# Patient Record
Sex: Male | Born: 1955 | Race: White | Hispanic: No | Marital: Married | Smoking: Never smoker
Health system: Southern US, Community
[De-identification: ages and names within clinical notes are randomized; demographics above are authoritative.]

## PROBLEM LIST (undated history)

## (undated) DIAGNOSIS — I4891 Unspecified atrial fibrillation: Secondary | ICD-10-CM

## (undated) DIAGNOSIS — F32A Depression, unspecified: Secondary | ICD-10-CM

## (undated) DIAGNOSIS — T7840XA Allergy, unspecified, initial encounter: Secondary | ICD-10-CM

## (undated) DIAGNOSIS — I499 Cardiac arrhythmia, unspecified: Secondary | ICD-10-CM

## (undated) DIAGNOSIS — G4733 Obstructive sleep apnea (adult) (pediatric): Secondary | ICD-10-CM

## (undated) DIAGNOSIS — L509 Urticaria, unspecified: Secondary | ICD-10-CM

## (undated) DIAGNOSIS — C61 Malignant neoplasm of prostate: Secondary | ICD-10-CM

## (undated) DIAGNOSIS — K5792 Diverticulitis of intestine, part unspecified, without perforation or abscess without bleeding: Secondary | ICD-10-CM

## (undated) DIAGNOSIS — F329 Major depressive disorder, single episode, unspecified: Secondary | ICD-10-CM

## (undated) DIAGNOSIS — M1990 Unspecified osteoarthritis, unspecified site: Secondary | ICD-10-CM

## (undated) DIAGNOSIS — M47816 Spondylosis without myelopathy or radiculopathy, lumbar region: Secondary | ICD-10-CM

## (undated) DIAGNOSIS — G4730 Sleep apnea, unspecified: Secondary | ICD-10-CM

## (undated) DIAGNOSIS — D369 Benign neoplasm, unspecified site: Secondary | ICD-10-CM

## (undated) DIAGNOSIS — R194 Change in bowel habit: Secondary | ICD-10-CM

## (undated) DIAGNOSIS — K5900 Constipation, unspecified: Secondary | ICD-10-CM

## (undated) DIAGNOSIS — I10 Essential (primary) hypertension: Secondary | ICD-10-CM

## (undated) HISTORY — DX: Allergy, unspecified, initial encounter: T78.40XA

## (undated) HISTORY — DX: Major depressive disorder, single episode, unspecified: F32.9

## (undated) HISTORY — DX: Unspecified atrial fibrillation: I48.91

## (undated) HISTORY — PX: ROTATOR CUFF REPAIR: SHX139

## (undated) HISTORY — DX: Unspecified osteoarthritis, unspecified site: M19.90

## (undated) HISTORY — PX: VASECTOMY: SHX75

## (undated) HISTORY — PX: KNEE ARTHROSCOPY: SUR90

## (undated) HISTORY — PX: COLONOSCOPY: SHX174

## (undated) HISTORY — PX: EYE SURGERY: SHX253

## (undated) HISTORY — DX: Urticaria, unspecified: L50.9

## (undated) HISTORY — DX: Depression, unspecified: F32.A

---

## 2005-04-07 ENCOUNTER — Ambulatory Visit: Payer: Self-pay | Admitting: Internal Medicine

## 2005-05-24 ENCOUNTER — Ambulatory Visit: Payer: Self-pay | Admitting: Internal Medicine

## 2007-03-08 ENCOUNTER — Emergency Department: Payer: Self-pay | Admitting: Emergency Medicine

## 2007-03-09 ENCOUNTER — Other Ambulatory Visit: Payer: Self-pay

## 2007-10-22 ENCOUNTER — Ambulatory Visit: Payer: Self-pay | Admitting: Urology

## 2008-03-28 ENCOUNTER — Ambulatory Visit: Payer: Self-pay | Admitting: Unknown Physician Specialty

## 2010-02-05 ENCOUNTER — Ambulatory Visit: Payer: Self-pay | Admitting: Internal Medicine

## 2010-02-11 ENCOUNTER — Other Ambulatory Visit: Payer: Self-pay | Admitting: Unknown Physician Specialty

## 2010-02-12 ENCOUNTER — Inpatient Hospital Stay: Payer: Self-pay

## 2010-07-20 ENCOUNTER — Observation Stay: Payer: Self-pay | Admitting: Internal Medicine

## 2013-01-02 DIAGNOSIS — N411 Chronic prostatitis: Secondary | ICD-10-CM | POA: Insufficient documentation

## 2013-01-02 DIAGNOSIS — R361 Hematospermia: Secondary | ICD-10-CM | POA: Insufficient documentation

## 2013-01-02 DIAGNOSIS — N529 Male erectile dysfunction, unspecified: Secondary | ICD-10-CM | POA: Insufficient documentation

## 2014-06-09 DIAGNOSIS — Z8679 Personal history of other diseases of the circulatory system: Secondary | ICD-10-CM | POA: Insufficient documentation

## 2014-06-10 ENCOUNTER — Ambulatory Visit: Payer: Self-pay | Admitting: Internal Medicine

## 2014-09-17 DIAGNOSIS — I10 Essential (primary) hypertension: Secondary | ICD-10-CM | POA: Insufficient documentation

## 2015-12-11 DIAGNOSIS — Z9989 Dependence on other enabling machines and devices: Secondary | ICD-10-CM

## 2015-12-11 DIAGNOSIS — I10 Essential (primary) hypertension: Secondary | ICD-10-CM | POA: Insufficient documentation

## 2015-12-11 DIAGNOSIS — G4733 Obstructive sleep apnea (adult) (pediatric): Secondary | ICD-10-CM | POA: Insufficient documentation

## 2016-06-29 DIAGNOSIS — J0100 Acute maxillary sinusitis, unspecified: Secondary | ICD-10-CM | POA: Diagnosis not present

## 2016-10-12 DIAGNOSIS — J342 Deviated nasal septum: Secondary | ICD-10-CM | POA: Diagnosis not present

## 2016-10-12 DIAGNOSIS — J301 Allergic rhinitis due to pollen: Secondary | ICD-10-CM | POA: Diagnosis not present

## 2016-10-12 DIAGNOSIS — J309 Allergic rhinitis, unspecified: Secondary | ICD-10-CM | POA: Diagnosis not present

## 2016-11-02 DIAGNOSIS — J309 Allergic rhinitis, unspecified: Secondary | ICD-10-CM | POA: Diagnosis not present

## 2016-12-08 DIAGNOSIS — Z0000 Encounter for general adult medical examination without abnormal findings: Secondary | ICD-10-CM | POA: Diagnosis not present

## 2016-12-14 DIAGNOSIS — Z0000 Encounter for general adult medical examination without abnormal findings: Secondary | ICD-10-CM | POA: Diagnosis not present

## 2016-12-19 DIAGNOSIS — D1800 Hemangioma unspecified site: Secondary | ICD-10-CM | POA: Diagnosis not present

## 2016-12-19 DIAGNOSIS — L821 Other seborrheic keratosis: Secondary | ICD-10-CM | POA: Diagnosis not present

## 2017-03-09 DIAGNOSIS — N401 Enlarged prostate with lower urinary tract symptoms: Secondary | ICD-10-CM | POA: Diagnosis not present

## 2017-03-09 DIAGNOSIS — N411 Chronic prostatitis: Secondary | ICD-10-CM | POA: Diagnosis not present

## 2017-03-09 DIAGNOSIS — R972 Elevated prostate specific antigen [PSA]: Secondary | ICD-10-CM | POA: Diagnosis not present

## 2017-03-09 DIAGNOSIS — N138 Other obstructive and reflux uropathy: Secondary | ICD-10-CM | POA: Diagnosis not present

## 2017-03-09 DIAGNOSIS — R3989 Other symptoms and signs involving the genitourinary system: Secondary | ICD-10-CM | POA: Insufficient documentation

## 2017-03-09 DIAGNOSIS — R339 Retention of urine, unspecified: Secondary | ICD-10-CM | POA: Insufficient documentation

## 2017-03-18 DIAGNOSIS — R972 Elevated prostate specific antigen [PSA]: Secondary | ICD-10-CM | POA: Diagnosis not present

## 2017-03-31 DIAGNOSIS — Z23 Encounter for immunization: Secondary | ICD-10-CM | POA: Diagnosis not present

## 2017-04-26 DIAGNOSIS — C61 Malignant neoplasm of prostate: Secondary | ICD-10-CM | POA: Diagnosis not present

## 2017-04-26 DIAGNOSIS — R935 Abnormal findings on diagnostic imaging of other abdominal regions, including retroperitoneum: Secondary | ICD-10-CM | POA: Diagnosis not present

## 2017-04-26 DIAGNOSIS — R972 Elevated prostate specific antigen [PSA]: Secondary | ICD-10-CM | POA: Diagnosis not present

## 2017-04-26 DIAGNOSIS — R978 Other abnormal tumor markers: Secondary | ICD-10-CM | POA: Insufficient documentation

## 2017-05-03 DIAGNOSIS — N320 Bladder-neck obstruction: Secondary | ICD-10-CM | POA: Diagnosis not present

## 2017-05-03 DIAGNOSIS — C61 Malignant neoplasm of prostate: Secondary | ICD-10-CM | POA: Diagnosis not present

## 2017-06-26 DIAGNOSIS — M25552 Pain in left hip: Secondary | ICD-10-CM | POA: Diagnosis not present

## 2017-06-26 DIAGNOSIS — M1612 Unilateral primary osteoarthritis, left hip: Secondary | ICD-10-CM | POA: Diagnosis not present

## 2017-06-27 DIAGNOSIS — M1612 Unilateral primary osteoarthritis, left hip: Secondary | ICD-10-CM | POA: Diagnosis not present

## 2017-07-12 DIAGNOSIS — M1612 Unilateral primary osteoarthritis, left hip: Secondary | ICD-10-CM | POA: Diagnosis not present

## 2017-08-10 DIAGNOSIS — C61 Malignant neoplasm of prostate: Secondary | ICD-10-CM | POA: Diagnosis not present

## 2017-08-10 DIAGNOSIS — I10 Essential (primary) hypertension: Secondary | ICD-10-CM | POA: Diagnosis not present

## 2017-08-10 DIAGNOSIS — N320 Bladder-neck obstruction: Secondary | ICD-10-CM | POA: Diagnosis not present

## 2017-08-14 DIAGNOSIS — K5900 Constipation, unspecified: Secondary | ICD-10-CM | POA: Diagnosis not present

## 2017-08-14 DIAGNOSIS — R194 Change in bowel habit: Secondary | ICD-10-CM | POA: Diagnosis not present

## 2017-08-14 DIAGNOSIS — C61 Malignant neoplasm of prostate: Secondary | ICD-10-CM | POA: Diagnosis not present

## 2017-08-25 ENCOUNTER — Ambulatory Visit: Admission: RE | Admit: 2017-08-25 | Payer: 59 | Source: Ambulatory Visit | Admitting: Unknown Physician Specialty

## 2017-08-25 ENCOUNTER — Encounter: Admission: RE | Payer: Self-pay | Source: Ambulatory Visit

## 2017-08-25 SURGERY — COLONOSCOPY WITH PROPOFOL
Anesthesia: General

## 2017-09-11 DIAGNOSIS — R05 Cough: Secondary | ICD-10-CM | POA: Diagnosis not present

## 2017-09-11 DIAGNOSIS — J40 Bronchitis, not specified as acute or chronic: Secondary | ICD-10-CM | POA: Diagnosis not present

## 2017-09-26 DIAGNOSIS — R5382 Chronic fatigue, unspecified: Secondary | ICD-10-CM | POA: Diagnosis not present

## 2017-10-17 DIAGNOSIS — J309 Allergic rhinitis, unspecified: Secondary | ICD-10-CM | POA: Diagnosis not present

## 2017-10-19 ENCOUNTER — Encounter: Payer: Self-pay | Admitting: *Deleted

## 2017-10-20 ENCOUNTER — Ambulatory Visit: Payer: 59 | Admitting: Anesthesiology

## 2017-10-20 ENCOUNTER — Other Ambulatory Visit: Payer: Self-pay

## 2017-10-20 ENCOUNTER — Ambulatory Visit
Admission: RE | Admit: 2017-10-20 | Discharge: 2017-10-20 | Disposition: A | Discharge status: Home / Self Care | Payer: 59 | Source: Ambulatory Visit | Attending: Unknown Physician Specialty | Admitting: Unknown Physician Specialty

## 2017-10-20 ENCOUNTER — Encounter: Payer: Self-pay | Admitting: *Deleted

## 2017-10-20 ENCOUNTER — Encounter
Admission: RE | Disposition: A | Discharge status: Home / Self Care | Payer: Self-pay | Source: Ambulatory Visit | Attending: Unknown Physician Specialty

## 2017-10-20 DIAGNOSIS — K5730 Diverticulosis of large intestine without perforation or abscess without bleeding: Secondary | ICD-10-CM | POA: Insufficient documentation

## 2017-10-20 DIAGNOSIS — Z9989 Dependence on other enabling machines and devices: Secondary | ICD-10-CM | POA: Diagnosis not present

## 2017-10-20 DIAGNOSIS — K635 Polyp of colon: Secondary | ICD-10-CM | POA: Insufficient documentation

## 2017-10-20 DIAGNOSIS — Z91013 Allergy to seafood: Secondary | ICD-10-CM | POA: Diagnosis not present

## 2017-10-20 DIAGNOSIS — Z79899 Other long term (current) drug therapy: Secondary | ICD-10-CM | POA: Diagnosis not present

## 2017-10-20 DIAGNOSIS — G4730 Sleep apnea, unspecified: Secondary | ICD-10-CM | POA: Diagnosis not present

## 2017-10-20 DIAGNOSIS — K5790 Diverticulosis of intestine, part unspecified, without perforation or abscess without bleeding: Secondary | ICD-10-CM | POA: Diagnosis not present

## 2017-10-20 DIAGNOSIS — Z7982 Long term (current) use of aspirin: Secondary | ICD-10-CM | POA: Insufficient documentation

## 2017-10-20 DIAGNOSIS — D122 Benign neoplasm of ascending colon: Secondary | ICD-10-CM | POA: Insufficient documentation

## 2017-10-20 DIAGNOSIS — D120 Benign neoplasm of cecum: Secondary | ICD-10-CM | POA: Diagnosis not present

## 2017-10-20 DIAGNOSIS — K6289 Other specified diseases of anus and rectum: Secondary | ICD-10-CM | POA: Diagnosis not present

## 2017-10-20 DIAGNOSIS — I10 Essential (primary) hypertension: Secondary | ICD-10-CM | POA: Insufficient documentation

## 2017-10-20 DIAGNOSIS — K5900 Constipation, unspecified: Secondary | ICD-10-CM | POA: Diagnosis present

## 2017-10-20 DIAGNOSIS — D123 Benign neoplasm of transverse colon: Secondary | ICD-10-CM | POA: Diagnosis not present

## 2017-10-20 DIAGNOSIS — K640 First degree hemorrhoids: Secondary | ICD-10-CM | POA: Diagnosis not present

## 2017-10-20 HISTORY — PX: COLONOSCOPY WITH PROPOFOL: SHX5780

## 2017-10-20 HISTORY — DX: Essential (primary) hypertension: I10

## 2017-10-20 HISTORY — DX: Constipation, unspecified: K59.00

## 2017-10-20 HISTORY — DX: Change in bowel habit: R19.4

## 2017-10-20 HISTORY — DX: Sleep apnea, unspecified: G47.30

## 2017-10-20 SURGERY — COLONOSCOPY WITH PROPOFOL
Anesthesia: General

## 2017-10-20 MED ORDER — PROPOFOL 500 MG/50ML IV EMUL
INTRAVENOUS | Status: DC | PRN
  Administered 2017-10-20: 125 ug/kg/min via INTRAVENOUS

## 2017-10-20 MED ORDER — PROPOFOL 500 MG/50ML IV EMUL
INTRAVENOUS | Status: AC
  Filled 2017-10-20: qty 50

## 2017-10-20 MED ORDER — PROPOFOL 10 MG/ML IV BOLUS
INTRAVENOUS | Status: DC | PRN
  Administered 2017-10-20 (×2): 50 mg via INTRAVENOUS

## 2017-10-20 MED ORDER — SODIUM CHLORIDE 0.9 % IV SOLN
INTRAVENOUS | Status: DC
  Administered 2017-10-20: 13:00:00 via INTRAVENOUS

## 2017-10-20 NOTE — Anesthesia Preprocedure Evaluation (Signed)
Anesthesia Evaluation  Patient identified by MRN, date of birth, ID band Patient awake    Reviewed: Allergy & Precautions, H&P , NPO status , Patient's Chart, lab work & pertinent test results, reviewed documented beta blocker date and time   History of Anesthesia Complications Negative for: history of anesthetic complications  Airway Mallampati: I  TM Distance: >3 FB Neck ROM: full    Dental  (+) Dental Advidsory Given, Caps, Teeth Intact   Pulmonary neg shortness of breath, sleep apnea and Continuous Positive Airway Pressure Ventilation , neg COPD, neg recent URI,           Cardiovascular Exercise Tolerance: Good hypertension, (-) angina(-) CAD, (-) Past MI, (-) Cardiac Stents and (-) CABG + dysrhythmias (-) Valvular Problems/Murmurs     Neuro/Psych negative neurological ROS  negative psych ROS   GI/Hepatic negative GI ROS, Neg liver ROS,   Endo/Other  negative endocrine ROS  Renal/GU negative Renal ROS  negative genitourinary   Musculoskeletal   Abdominal   Peds  Hematology negative hematology ROS (+)   Anesthesia Other Findings Past Medical History: No date: Change in bowel habits No date: Constipation No date: Hypertension No date: Sleep apnea   Reproductive/Obstetrics negative OB ROS                             Anesthesia Physical Anesthesia Plan  ASA: II  Anesthesia Plan: General   Post-op Pain Management:    Induction: Intravenous  PONV Risk Score and Plan: 2 and Propofol infusion  Airway Management Planned: Natural Airway and Nasal Cannula  Additional Equipment:   Intra-op Plan:   Post-operative Plan:   Informed Consent: I have reviewed the patients History and Physical, chart, labs and discussed the procedure including the risks, benefits and alternatives for the proposed anesthesia with the patient or authorized representative who has indicated his/her  understanding and acceptance.   Dental Advisory Given  Plan Discussed with: Anesthesiologist, CRNA and Surgeon  Anesthesia Plan Comments:         Anesthesia Quick Evaluation

## 2017-10-20 NOTE — Op Note (Addendum)
[name] Patient Name: Adam Glass Procedure Date: 10/20/2017 1:29 PM MRN: [id number] Account #: [id number] Date of Birth: July 21, 1955 Admit Type: Outpatient Age: 62 Room: [facility] ENDO ROOM 3 Gender: Male Note Status: Finalized Procedure:            Colonoscopy Indications:          Change in bowel habits, Constipation Providers:            Manya Silvas, MD Referring MD:         Rusty Aus, MD (Referring MD) Medicines:            Propofol per Anesthesia Complications:        No immediate complications. Procedure:            Pre-Anesthesia Assessment:                       - After reviewing the risks and benefits, the patient                        was deemed in satisfactory condition to undergo the                        procedure.                       After obtaining informed consent, the colonoscope was                        passed under direct vision. Throughout the procedure,                        the patient's blood pressure, pulse, and oxygen                        saturations were monitored continuously. The                        Colonoscope was introduced through the anus and                        advanced to the the cecum, identified by appendiceal                        orifice and ileocecal valve. The colonoscopy was                        performed without difficulty. The patient tolerated the                        procedure well. The quality of the bowel preparation                        was good. Findings:      A medium polyp was found in the cecum. The polyp was sessile. The polyp       was removed with a hot snare. Resection and retrieval were complete. To       prevent bleeding after the polypectomy, one hemostatic clip was       successfully placed. There was no bleeding at the end of the procedure.      A small polyp was found in  the ascending colon. The polyp was sessile.       The polyp was removed with a  hot snare. Resection and retrieval were       complete.      A small polyp was found in the hepatic flexure. The polyp was sessile.       The polyp was removed with a hot snare. Resection and retrieval were       complete. To prevent bleeding after the polypectomy, one hemostatic clip       was successfully placed. There was no bleeding at the end of the       procedure.      A small polyp was found in the hepatic flexure. The polyp was sessile.       The polyp was removed with a hot snare. Resection and retrieval were       complete.      A small polyp was found in the hepatic flexure. The polyp was sessile.       The polyp was removed with a hot snare. Resection and retrieval were       complete. To prevent bleeding after the polypectomy, one hemostatic clip       was successfully placed. There was no bleeding at the end of the       procedure.      A small polyp was found in the distal transverse colon. The polyp was       sessile. The polyp was removed with a hot snare. Resection and retrieval       were complete.      A few small-mouthed diverticula were found in the transverse colon and       ascending colon.      Internal hemorrhoids were found during endoscopy. The hemorrhoids were       small and Grade I (internal hemorrhoids that do not prolapse).      The exam was otherwise without abnormality. Impression:           - One medium polyp in the cecum, removed with a hot                        snare. Resected and retrieved. Clip was placed.                       - One small polyp in the ascending colon, removed with                        a hot snare. Resected and retrieved.                       - One small polyp at the hepatic flexure, removed with                        a hot snare. Resected and retrieved. Clip was placed.                       - One small polyp at the hepatic flexure, removed with                        a hot snare. Resected and retrieved.                        -  One small polyp at the hepatic flexure, removed with                        a hot snare. Resected and retrieved. Clip was placed.                       - One small polyp in the distal transverse colon,                        removed with a hot snare. Resected and retrieved.                       - Diverticulosis in the transverse colon and in the                        ascending colon.                       - Internal hemorrhoids.                       - The examination was otherwise normal. Recommendation:       - Await pathology results. Manya Silvas, MD 10/20/2017 2:13:14 PM This report has been signed electronically. Number of Addenda: 0 Note Initiated On: 10/20/2017 1:29 PM Scope Withdrawal Time: 0 hours 27 minutes 18 seconds  Total Procedure Duration: 0 hours 33 minutes 0 seconds       [facility]

## 2017-10-20 NOTE — Transfer of Care (Signed)
Immediate Anesthesia Transfer of Care Note  Patient: Adam Glass  Procedure(s) Performed: COLONOSCOPY WITH PROPOFOL (N/A )  Patient Location: PACU and Endoscopy Unit  Anesthesia Type:General  Level of Consciousness: awake, alert  and oriented  Airway & Oxygen Therapy: Patient Spontanous Breathing and Patient connected to nasal cannula oxygen  Post-op Assessment: Report given to RN and Post -op Vital signs reviewed and stable  Post vital signs: Reviewed and stable  Last Vitals:  Vitals Value Taken Time  BP    Temp    Pulse    Resp    SpO2      Last Pain:  Vitals:   10/20/17 1248  TempSrc: Tympanic         Complications: No apparent anesthesia complications

## 2017-10-20 NOTE — H&P (Signed)
Primary Care Physician:  Rusty Aus, MD Primary Gastroenterologist:  Dr. Vira Agar  Pre-Procedure History & Physical: HPI:  Adam Glass is a 62 y.o. male is here for an colonoscopy.  For colon cancer screening.   Past Medical History:  Diagnosis Date  . Change in bowel habits   . Constipation   . Hypertension   . Sleep apnea     Past Surgical History:  Procedure Laterality Date  . COLONOSCOPY    . KNEE ARTHROSCOPY Left   . ROTATOR CUFF REPAIR Left   . VASECTOMY      Prior to Admission medications   Medication Sig Start Date End Date Taking? Authorizing Provider  aspirin EC 81 MG tablet Take 81 mg by mouth daily.   Yes [provider]  atenolol (TENORMIN) 100 MG tablet Take 100 mg by mouth daily.   Yes [provider]  cetirizine (ZYRTEC) 10 MG tablet Take 10 mg by mouth daily.   Yes [provider]  EPINEPHrine (EPIPEN 2-PAK) 0.3 mg/0.3 mL IJ SOAJ injection Inject 0.3 mg into the muscle once.   Yes [provider]  fluticasone (FLONASE) 50 MCG/ACT nasal spray Place 1 spray into both nostrils daily.   Yes [provider]  hydrALAZINE (APRESOLINE) 50 MG tablet Take 50 mg by mouth daily.   Yes [provider]  olmesartan (BENICAR) 40 MG tablet Take 40 mg by mouth daily.   Yes [provider]  potassium chloride (K-DUR,KLOR-CON) 10 MEQ tablet Take 10 mEq by mouth once.   Yes [provider]  potassium chloride (MICRO-K) 10 MEQ CR capsule Take 10 mEq by mouth daily.   Yes [provider]  vardenafil (LEVITRA) 20 MG tablet Take 20 mg by mouth daily as needed for erectile dysfunction.    [provider]    Allergies as of 09/27/2017  . (Not on File)    History reviewed. No pertinent family history.  Social History   Socioeconomic History  . Marital status: Married    Spouse name: Not on file  . Number of children: Not on file  . Years of education: Not on file  . Highest  education level: Not on file  Occupational History  . Not on file  Social Needs  . Financial resource strain: Not on file  . Food insecurity:    Worry: Not on file    Inability: Not on file  . Transportation needs:    Medical: Not on file    Non-medical: Not on file  Tobacco Use  . Smoking status: Never Smoker  . Smokeless tobacco: Never Used  Substance and Sexual Activity  . Alcohol use: Yes    Comment: occasional  . Drug use: Never  . Sexual activity: Not on file  Lifestyle  . Physical activity:    Days per week: Not on file    Minutes per session: Not on file  . Stress: Not on file  Relationships  . Social connections:    Talks on phone: Not on file    Gets together: Not on file    Attends religious service: Not on file    Active member of club or organization: Not on file    Attends meetings of clubs or organizations: Not on file    Relationship status: Not on file  . Intimate partner violence:    Fear of current or ex partner: Not on file    Emotionally abused: Not on file    Physically abused:  Not on file    Forced sexual activity: Not on file  Other Topics Concern  . Not on file  Social History Narrative  . Not on file    Review of Systems: See HPI, otherwise negative ROS  Physical Exam: BP (!) 141/114   Pulse (!) 51   Temp (!) 97 F (36.1 C) (Tympanic)   Resp 16   Ht 6\' 4"  (1.93 m)   Wt 93 kg (205 lb)   SpO2 100%   BMI 24.95 kg/m  General:   Alert,  pleasant and cooperative in NAD Head:  Normocephalic and atraumatic. Neck:  Supple; no masses or thyromegaly. Lungs:  Clear throughout to auscultation.    Heart:  Regular rate and rhythm. Abdomen:  Soft, nontender and nondistended. Normal bowel sounds, without guarding, and without rebound.   Neurologic:  Alert and  oriented x4;  grossly normal neurologically.  Impression/Plan: Adam Glass is here for an colonoscopy to be performed for screening colonoscopy  Risks, benefits, limitations, and  alternatives regarding  colonoscopy have been reviewed with the patient.  Questions have been answered.  All parties agreeable.   Gaylyn Cheers, MD  10/20/2017, 1:28 PM

## 2017-10-20 NOTE — Anesthesia Post-op Follow-up Note (Signed)
Anesthesia QCDR form completed.        

## 2017-10-21 NOTE — Anesthesia Postprocedure Evaluation (Signed)
Anesthesia Post Note  Patient: Adam Glass  Procedure(s) Performed: COLONOSCOPY WITH PROPOFOL (N/A )  Patient location during evaluation: Endoscopy Anesthesia Type: General Level of consciousness: awake and alert Pain management: pain level controlled Vital Signs Assessment: post-procedure vital signs reviewed and stable Respiratory status: spontaneous breathing, nonlabored ventilation, respiratory function stable and patient connected to nasal cannula oxygen Cardiovascular status: blood pressure returned to baseline and stable Postop Assessment: no apparent nausea or vomiting Anesthetic complications: no     Last Vitals:  Vitals:   10/20/17 1430 10/20/17 1440  BP: 132/83 (!) 168/83  Pulse: (!) 52 (!) 46  Resp: 18 12  Temp:    SpO2: 98% 99%    Last Pain:  Vitals:   10/21/17 1111  TempSrc:   PainSc: 0-No pain                 Martha Clan

## 2017-10-23 ENCOUNTER — Encounter: Payer: Self-pay | Admitting: Unknown Physician Specialty

## 2017-10-24 LAB — SURGICAL PATHOLOGY

## 2017-10-26 ENCOUNTER — Telehealth: Payer: Self-pay | Admitting: Urology

## 2017-10-26 DIAGNOSIS — D369 Benign neoplasm, unspecified site: Secondary | ICD-10-CM | POA: Insufficient documentation

## 2017-10-26 DIAGNOSIS — C61 Malignant neoplasm of prostate: Secondary | ICD-10-CM | POA: Diagnosis not present

## 2017-11-01 NOTE — Telephone Encounter (Signed)
error 

## 2017-11-29 DIAGNOSIS — L578 Other skin changes due to chronic exposure to nonionizing radiation: Secondary | ICD-10-CM | POA: Diagnosis not present

## 2017-11-29 DIAGNOSIS — Z1283 Encounter for screening for malignant neoplasm of skin: Secondary | ICD-10-CM | POA: Diagnosis not present

## 2017-11-29 DIAGNOSIS — L570 Actinic keratosis: Secondary | ICD-10-CM | POA: Diagnosis not present

## 2017-12-08 DIAGNOSIS — Z0000 Encounter for general adult medical examination without abnormal findings: Secondary | ICD-10-CM | POA: Diagnosis not present

## 2017-12-15 DIAGNOSIS — Z0000 Encounter for general adult medical examination without abnormal findings: Secondary | ICD-10-CM | POA: Diagnosis not present

## 2017-12-26 ENCOUNTER — Other Ambulatory Visit: Payer: Self-pay

## 2017-12-28 ENCOUNTER — Encounter: Payer: Self-pay | Admitting: Urology

## 2017-12-28 ENCOUNTER — Ambulatory Visit: Payer: 59 | Admitting: Urology

## 2017-12-28 ENCOUNTER — Other Ambulatory Visit: Payer: Self-pay

## 2017-12-28 VITALS — BP 132/87 | HR 55 | Ht 76.0 in | Wt 206.7 lb

## 2017-12-28 DIAGNOSIS — C61 Malignant neoplasm of prostate: Secondary | ICD-10-CM

## 2017-12-28 NOTE — Progress Notes (Signed)
12/28/2017 12:15 PM   Adam Glass Nov 11, 1955 [id number]  Referring provider: Rusty Aus, MD [facility] [place], [place] [address]  Chief Complaint  Patient presents with  . Prostate Cancer   Urologic history: 1. 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 -Biopsy positive Gleason 3+3 adenocarcinoma (5%) left mid core  HPI: 62 year old male presents to ED establish local urologic care.  He has previously seen Dr. Jacqlyn Larsen for several years for BPH and chronic prostatitis.  At a visit in October 2018 he was found to have a 8 mm prostate nodule.  His PSA was 2.02.  A 4K score was performed which showed a 21% probability of high-grade prostate cancer.  He elected prostate biopsy which was performed on 04/26/2017.  Prostate volume was 60 cc.  Pathology showed a focus of Gleason 3+3 adenocarcinoma at the left mid prostate involving 5% of the submitted tissue.  He elected active surveillance.  He last saw Dr. Jacqlyn Larsen in March 2019 and a PSA was 2.25.  A PSA drawn this month was 2.43.  He has no bothersome lower urinary tract symptoms.  He has erectile dysfunction and uses generic sildenafil.   PMH: Past Medical History:  Diagnosis Date  . Arthritis   . Atrial fibrillation ([place])   . Change in bowel habits   . Constipation   . Depression   . Hypertension   . Sleep apnea   . Sleep apnea     Surgical History: Past Surgical History:  Procedure Laterality Date  . COLONOSCOPY    . COLONOSCOPY WITH PROPOFOL N/A 10/20/2017   Procedure: COLONOSCOPY WITH PROPOFOL;  Surgeon: Manya Silvas, MD;  Location: [facility] ENDOSCOPY;  Service: Endoscopy;  Laterality: N/A;  . KNEE ARTHROSCOPY Left   . ROTATOR CUFF REPAIR Left   . VASECTOMY      Home Medications:  Allergies as of 12/28/2017      Reactions   Shellfish Allergy Other (See  Comments)   unknown      Medication List        Accurate as of 12/28/17 12:15 PM. Always use your most recent med list.          aspirin EC 81 MG tablet Take 81 mg by mouth daily.   atenolol 100 MG tablet Commonly known as:  TENORMIN Take 100 mg by mouth daily.   cetirizine 10 MG tablet Commonly known as:  ZYRTEC Take 10 mg by mouth daily.   EPIPEN 2-PAK 0.3 mg/0.3 mL Soaj injection Generic drug:  EPINEPHrine Inject 0.3 mg into the muscle once.   fluticasone 50 MCG/ACT nasal spray Commonly known as:  FLONASE Place 1 spray into both nostrils daily.   hydrALAZINE 50 MG tablet Commonly known as:  APRESOLINE Take 50 mg by mouth daily.   olmesartan 40 MG tablet Commonly known as:  BENICAR Take 40 mg by mouth daily.   potassium chloride 10 MEQ tablet Commonly known as:  K-DUR,KLOR-CON Take 10 mEq by mouth once.   sertraline 50 MG tablet Commonly known as:  ZOLOFT   sildenafil 20 MG tablet Commonly known as:  REVATIO 3-5 tablets po daily as needed       Allergies:  Allergies  Allergen Reactions  . Shellfish Allergy Other (See Comments)    unknown    Family History: Family History  Problem Relation Age of Onset  . Heart attack Father  Social History:  reports that he has never smoked. He has never used smokeless tobacco. He reports that he drinks alcohol. He reports that he does not use drugs.  ROS: UROLOGY Frequent Urination?: Yes Hard to postpone urination?: Yes Burning/pain with urination?: No Get up at night to urinate?: No Leakage of urine?: No Urine stream starts and stops?: No Trouble starting stream?: No Do you have to strain to urinate?: No Blood in urine?: No Urinary tract infection?: No Sexually transmitted disease?: No Injury to kidneys or bladder?: No Painful intercourse?: No Weak stream?: No Erection problems?: Yes Penile pain?: No  Gastrointestinal Nausea?: No Vomiting?: No Indigestion/heartburn?: No Diarrhea?:  No Constipation?: Yes  Constitutional Fever: No Night sweats?: No Weight loss?: No Fatigue?: No  Skin Skin rash/lesions?: No Itching?: No  Eyes Blurred vision?: No Double vision?: No  Ears/Nose/Throat Sore throat?: No Sinus problems?: No  Hematologic/Lymphatic Swollen glands?: No Easy bruising?: No  Cardiovascular Leg swelling?: No Chest pain?: No  Respiratory Cough?: No Shortness of breath?: No  Endocrine Excessive thirst?: No  Musculoskeletal Back pain?: No Joint pain?: No  Neurological Headaches?: No Dizziness?: No  Psychologic Depression?: Yes Anxiety?: No  Physical Exam: BP 132/87 (BP Location: Left Arm, Patient Position: Sitting, Cuff Size: Large)   Pulse (!) 55   Ht 6\' 4"  (1.93 m)   Wt 206 lb 11.2 oz (93.8 kg)   BMI 25.16 kg/m   Constitutional:  Alert and oriented, No acute distress. HEENT: [place] AT, moist mucus membranes.  Trachea midline, no masses. Cardiovascular: No clubbing, cyanosis, or edema. Respiratory: Normal respiratory effort, no increased work of breathing. GI: Abdomen is soft, nontender, nondistended, no abdominal masses GU: No CVA tenderness.  Prostate 40 g with mild asymmetry L>R. "Soft nodule" right lateral apex. Lymph: No cervical or inguinal lymphadenopathy. Skin: No rashes, bruises or suspicious lesions. Neurologic: Grossly intact, no focal deficits, moving all 4 extremities. Psychiatric: Normal mood and affect.   Assessment & Plan:   62 year old male with clinical T1c low risk prostate cancer on active surveillance.  We discussed a confirmatory biopsy within 1 year of diagnosis.  His PSA has fluctuated since 2015 and has been up to 2.39.  I recommended a prostate MRI in approximately 3 months and fusion biopsy if PI-RADS 3/4/5 lesions are identified.  If MRI negative will discuss confirmatory standard biopsy.  Return in about 4 months (around 04/30/2018) for PSA, Recheck.   Abbie Sons, [place] [address] [place],  [address] [phone]

## 2017-12-31 ENCOUNTER — Encounter: Payer: Self-pay | Admitting: Urology

## 2018-02-06 DIAGNOSIS — R062 Wheezing: Secondary | ICD-10-CM | POA: Diagnosis not present

## 2018-02-13 DIAGNOSIS — M5416 Radiculopathy, lumbar region: Secondary | ICD-10-CM | POA: Diagnosis not present

## 2018-02-13 DIAGNOSIS — M1612 Unilateral primary osteoarthritis, left hip: Secondary | ICD-10-CM | POA: Diagnosis not present

## 2018-02-13 DIAGNOSIS — M5136 Other intervertebral disc degeneration, lumbar region: Secondary | ICD-10-CM | POA: Diagnosis not present

## 2018-03-21 ENCOUNTER — Other Ambulatory Visit: Payer: 59

## 2018-04-16 ENCOUNTER — Ambulatory Visit
Admission: RE | Admit: 2018-04-16 | Discharge: 2018-04-16 | Disposition: A | Discharge status: Home / Self Care | Payer: 59 | Source: Ambulatory Visit | Attending: Urology | Admitting: Urology

## 2018-04-16 DIAGNOSIS — C61 Malignant neoplasm of prostate: Secondary | ICD-10-CM | POA: Diagnosis not present

## 2018-04-16 MED ORDER — GADOBENATE DIMEGLUMINE 529 MG/ML IV SOLN
19.0000 mL | Freq: Once | INTRAVENOUS | Status: AC | PRN
  Administered 2018-04-16: 19 mL via INTRAVENOUS

## 2018-04-17 ENCOUNTER — Telehealth: Payer: Self-pay

## 2018-04-17 NOTE — Telephone Encounter (Signed)
Called pt informed him of information below. Pt gave verbal understanding.  

## 2018-04-17 NOTE — Telephone Encounter (Signed)
-----   Message from Abbie Sons, MD sent at 04/16/2018  6:48 PM EST ----- Prostate MRI showed no evidence concerning for high-grade prostate cancer.  Keep scheduled follow-up.

## 2018-04-25 ENCOUNTER — Other Ambulatory Visit: Payer: Self-pay

## 2018-04-25 DIAGNOSIS — C61 Malignant neoplasm of prostate: Secondary | ICD-10-CM

## 2018-04-26 ENCOUNTER — Other Ambulatory Visit: Payer: 59

## 2018-04-26 DIAGNOSIS — C61 Malignant neoplasm of prostate: Secondary | ICD-10-CM

## 2018-04-27 ENCOUNTER — Encounter: Payer: Self-pay | Admitting: Urology

## 2018-04-27 ENCOUNTER — Ambulatory Visit: Payer: 59 | Admitting: Urology

## 2018-04-27 VITALS — BP 158/82 | HR 56 | Ht 76.0 in | Wt 211.8 lb

## 2018-04-27 DIAGNOSIS — C61 Malignant neoplasm of prostate: Secondary | ICD-10-CM | POA: Diagnosis not present

## 2018-04-27 LAB — PSA: PROSTATE SPECIFIC AG, SERUM: 2.7 ng/mL (ref 0.0–4.0)

## 2018-04-27 NOTE — Progress Notes (Signed)
04/27/2018 1:53 PM   Adam Glass April 01, 1956 [id number]  Referring provider: Rusty Aus, MD [facility] [place], [place] [address]  Chief Complaint  Patient presents with  . Prostate Cancer   Urologic history: 1. 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 -Biopsy positive Gleason 3+3 adenocarcinoma (5%) left mid core  HPI: 62 year old male presents for follow-up of low risk prostate cancer.  He has been doing well since his last visit and denies bothersome lower urinary tract symptoms, dysuria or gross hematuria.  A PSA performed earlier this month was 2.7.  Prostate MRI performed on 04/16/2018 showed no abnormalities suspicious for high-grade prostate cancer.  Prostate volume was 62 cc.   PMH: Past Medical History:  Diagnosis Date  . Arthritis   . Atrial fibrillation ([place])   . Change in bowel habits   . Constipation   . Depression   . Hypertension   . Sleep apnea   . Sleep apnea     Surgical History: Past Surgical History:  Procedure Laterality Date  . COLONOSCOPY    . COLONOSCOPY WITH PROPOFOL N/A 10/20/2017   Procedure: COLONOSCOPY WITH PROPOFOL;  Surgeon: Manya Silvas, MD;  Location: [facility] ENDOSCOPY;  Service: Endoscopy;  Laterality: N/A;  . KNEE ARTHROSCOPY Left   . ROTATOR CUFF REPAIR Left   . VASECTOMY      Home Medications:  Allergies as of 04/27/2018      Reactions   Shellfish Allergy Other (See Comments)   unknown      Medication List        Accurate as of 04/27/18  1:53 PM. Always use your most recent med list.          aspirin EC 81 MG tablet Take 81 mg by mouth daily.   atenolol 100 MG tablet Commonly known as:  TENORMIN Take 100 mg by mouth daily.   cetirizine 10 MG tablet Commonly known as:  ZYRTEC Take 10 mg by mouth daily.   EPIPEN 2-PAK 0.3 mg/0.3 mL Soaj  injection Generic drug:  EPINEPHrine Inject 0.3 mg into the muscle once.   fluticasone 50 MCG/ACT nasal spray Commonly known as:  FLONASE Place 1 spray into both nostrils daily.   hydrALAZINE 50 MG tablet Commonly known as:  APRESOLINE Take 50 mg by mouth daily.   olmesartan 40 MG tablet Commonly known as:  BENICAR Take 40 mg by mouth daily.   potassium chloride 10 MEQ CR capsule Commonly known as:  MICRO-K   potassium chloride 10 MEQ tablet Commonly known as:  K-DUR,KLOR-CON Take 10 mEq by mouth once.   sertraline 50 MG tablet Commonly known as:  ZOLOFT   sildenafil 20 MG tablet Commonly known as:  REVATIO 3-5 tablets po daily as needed   UNABLE TO FIND Med Name: allergy drops       Allergies:  Allergies  Allergen Reactions  . Shellfish Allergy Other (See Comments)    unknown    Family History: Family History  Problem Relation Age of Onset  . Heart attack Father     Social History:  reports that he has never smoked. He has never used smokeless tobacco. He reports that he drinks alcohol. He reports that he does not use drugs.  ROS: UROLOGY Frequent Urination?: No Hard to postpone urination?: No Burning/pain with urination?: No Get up at night to urinate?: No Leakage of urine?: No  Urine stream starts and stops?: No Trouble starting stream?: No Do you have to strain to urinate?: No Blood in urine?: No Urinary tract infection?: No Sexually transmitted disease?: No Injury to kidneys or bladder?: No Painful intercourse?: No Weak stream?: No Erection problems?: Yes Penile pain?: No  Gastrointestinal Nausea?: No Vomiting?: No Indigestion/heartburn?: No Diarrhea?: No Constipation?: No  Constitutional Fever: No Night sweats?: No Weight loss?: No Fatigue?: No  Skin Skin rash/lesions?: No Itching?: No  Eyes Blurred vision?: No Double vision?: No  Ears/Nose/Throat Sore throat?: No Sinus problems?: No  Hematologic/Lymphatic Swollen  glands?: No Easy bruising?: No  Cardiovascular Leg swelling?: No Chest pain?: No  Respiratory Cough?: No Shortness of breath?: No  Endocrine Excessive thirst?: No  Musculoskeletal Back pain?: No Joint pain?: No  Neurological Headaches?: No Dizziness?: No  Psychologic Depression?: No Anxiety?: No  Physical Exam: BP (!) 158/82 (BP Location: Left Arm, Patient Position: Sitting, Cuff Size: Normal)   Pulse (!) 56   Ht 6\' 4"  (1.93 m)   Wt 211 lb 12.8 oz (96.1 kg)   BMI 25.78 kg/m   Constitutional:  Alert and oriented, No acute distress. HEENT: [place] AT, moist mucus membranes.  Trachea midline, no masses. Cardiovascular: No clubbing, cyanosis, or edema. Respiratory: Normal respiratory effort, no increased work of breathing. GI: Abdomen is soft, nontender, nondistended, no abdominal masses GU: No CVA tenderness Lymph: No cervical or inguinal lymphadenopathy. Skin: No rashes, bruises or suspicious lesions. Neurologic: Grossly intact, no focal deficits, moving all 4 extremities. Psychiatric: Normal mood and affect.   Assessment & Plan:   62 year old male with low risk prostate cancer on active surveillance.  Recent prostate MRI showed no areas suspicious for high-grade prostate cancer.  Recommend scheduling a standard confirmatory biopsy.  The procedure was discussed including potential risks of bleeding and infection/sepsis.  He desires to schedule.  Abbie Sons, [place] [address], [place], [place] [address] [phone]

## 2018-04-29 ENCOUNTER — Encounter: Payer: Self-pay | Admitting: Urology

## 2018-05-02 ENCOUNTER — Ambulatory Visit: Payer: 59 | Admitting: Urology

## 2018-05-25 ENCOUNTER — Encounter: Payer: Self-pay | Admitting: Urology

## 2018-05-25 ENCOUNTER — Ambulatory Visit: Payer: 59 | Admitting: Urology

## 2018-05-25 ENCOUNTER — Other Ambulatory Visit: Payer: Self-pay | Admitting: Urology

## 2018-05-25 VITALS — BP 165/89 | HR 58 | Ht 76.0 in | Wt 211.6 lb

## 2018-05-25 DIAGNOSIS — C61 Malignant neoplasm of prostate: Secondary | ICD-10-CM

## 2018-05-25 DIAGNOSIS — N411 Chronic prostatitis: Secondary | ICD-10-CM | POA: Diagnosis not present

## 2018-05-25 MED ORDER — GENTAMICIN SULFATE 40 MG/ML IJ SOLN
80.0000 mg | Freq: Once | INTRAMUSCULAR | Status: AC
  Administered 2018-05-25: 80 mg via INTRAMUSCULAR

## 2018-05-25 MED ORDER — LEVOFLOXACIN 500 MG PO TABS
500.0000 mg | ORAL_TABLET | Freq: Once | ORAL | Status: AC
  Administered 2018-05-25: 500 mg via ORAL

## 2018-05-25 NOTE — Progress Notes (Signed)
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 -Biopsy positive Gleason 3+3 adenocarcinoma(5%)left mid core  Presents for confirmatory biopsy  Informed consent was obtained after discussing risks/benefits of the procedure.  A time out was performed to ensure correct patient identity.  Pre-Procedure: - Last PSA Level: 11/27 2.7 - Gentamicin given prophylactically - Levaquin 500 mg administered PO -Transrectal Ultrasound performed revealing a 60 gm prostate -No significant hypoechoic or median lobe noted  Procedure: - Prostate block performed using 10 cc 1% lidocaine and biopsies taken from sextant areas, a total of 12 under ultrasound guidance.  Post-Procedure: - Patient tolerated the procedure well - He was counseled to seek immediate medical attention if experiences any severe pain, significant bleeding, or fevers - Return in one week to discuss biopsy results    John Giovanni, MD

## 2018-06-01 LAB — PATHOLOGY REPORT

## 2018-06-08 ENCOUNTER — Other Ambulatory Visit: Payer: Self-pay | Admitting: Urology

## 2018-06-08 ENCOUNTER — Ambulatory Visit: Payer: 59 | Admitting: Urology

## 2018-06-29 ENCOUNTER — Encounter: Payer: Self-pay | Admitting: Urology

## 2018-07-23 DIAGNOSIS — M25562 Pain in left knee: Secondary | ICD-10-CM | POA: Diagnosis not present

## 2018-07-23 DIAGNOSIS — M1732 Unilateral post-traumatic osteoarthritis, left knee: Secondary | ICD-10-CM | POA: Diagnosis not present

## 2018-08-06 ENCOUNTER — Other Ambulatory Visit: Payer: Self-pay | Admitting: Physician Assistant

## 2018-08-06 DIAGNOSIS — M25562 Pain in left knee: Secondary | ICD-10-CM

## 2018-08-15 ENCOUNTER — Other Ambulatory Visit: Payer: Self-pay

## 2018-08-15 ENCOUNTER — Ambulatory Visit
Admission: RE | Admit: 2018-08-15 | Discharge: 2018-08-15 | Disposition: A | Discharge status: Home / Self Care | Payer: 59 | Source: Ambulatory Visit | Attending: Physician Assistant | Admitting: Physician Assistant

## 2018-08-15 DIAGNOSIS — M1712 Unilateral primary osteoarthritis, left knee: Secondary | ICD-10-CM | POA: Diagnosis not present

## 2018-08-15 DIAGNOSIS — M25562 Pain in left knee: Secondary | ICD-10-CM | POA: Diagnosis not present

## 2018-08-23 DIAGNOSIS — M2392 Unspecified internal derangement of left knee: Secondary | ICD-10-CM | POA: Diagnosis not present

## 2018-09-20 ENCOUNTER — Other Ambulatory Visit: Payer: Self-pay | Admitting: Urology

## 2018-09-20 DIAGNOSIS — C61 Malignant neoplasm of prostate: Secondary | ICD-10-CM

## 2018-10-09 ENCOUNTER — Other Ambulatory Visit: Payer: Self-pay

## 2018-10-09 ENCOUNTER — Other Ambulatory Visit: Payer: 59

## 2018-10-09 DIAGNOSIS — C61 Malignant neoplasm of prostate: Secondary | ICD-10-CM | POA: Diagnosis not present

## 2018-10-10 LAB — PSA: Prostate Specific Ag, Serum: 2.8 ng/mL (ref 0.0–4.0)

## 2018-10-17 ENCOUNTER — Ambulatory Visit: Payer: 59 | Admitting: Urology

## 2018-10-18 ENCOUNTER — Telehealth (INDEPENDENT_AMBULATORY_CARE_PROVIDER_SITE_OTHER): Payer: 59 | Admitting: Urology

## 2018-10-18 ENCOUNTER — Other Ambulatory Visit: Payer: Self-pay

## 2018-10-18 DIAGNOSIS — C61 Malignant neoplasm of prostate: Secondary | ICD-10-CM

## 2018-10-18 NOTE — Progress Notes (Signed)
Virtual Visit via Video Note  I connected with Adam Glass on 10/18/18 at 10:30 AM EDT by a video enabled telemedicine application and verified that I am speaking with the correct person using two identifiers.  Location: Patient: Home Provider: Home   I discussed the limitations of evaluation and management by telemedicine and the availability of in person appointments. The patient expressed understanding and agreed to proceed.  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 -Biopsy positive Gleason 3+3 adenocarcinoma(5%)left mid core -Volume 59.9 cc -Confirmatory biopsy 05/2018; volume 60 cc -All cores negative for prostate cancer.  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  Confirmatory biopsy performed December 2019 was negative for cancer.  Since his last visit he states he has been doing well.  He has mild urinary frequency and hesitancy but her his voiding symptoms are not bothersome enough that he desires medical management.   PSA performed 10/09/2018 stable at 2.8  Observations/Objective: Alert, in no acute distress  Assessment and Plan: 63 year old male with T1c very low risk prostate cancer.  Recent confirmatory biopsy was negative and PSA is stable.  Follow Up Instructions: 6 months with PSA/DRE   I discussed the assessment and treatment plan with the patient. The patient was provided an opportunity to ask questions and all were answered. The patient agreed with the plan and demonstrated an understanding of the instructions.   The patient was advised to call back or seek an in-person evaluation if the symptoms worsen or if the condition fails to improve as  anticipated.  I provided 7 minutes of non-face-to-face time during this encounter.   Abbie Sons, MD

## 2018-11-06 ENCOUNTER — Encounter
Admission: RE | Admit: 2018-11-06 | Discharge: 2018-11-06 | Disposition: A | Discharge status: Home / Self Care | Payer: 59 | Source: Ambulatory Visit | Attending: Orthopedic Surgery | Admitting: Orthopedic Surgery

## 2018-11-06 ENCOUNTER — Other Ambulatory Visit: Payer: Self-pay

## 2018-11-06 DIAGNOSIS — Z7982 Long term (current) use of aspirin: Secondary | ICD-10-CM | POA: Diagnosis not present

## 2018-11-06 DIAGNOSIS — Z79899 Other long term (current) drug therapy: Secondary | ICD-10-CM | POA: Diagnosis not present

## 2018-11-06 DIAGNOSIS — I10 Essential (primary) hypertension: Secondary | ICD-10-CM | POA: Insufficient documentation

## 2018-11-06 DIAGNOSIS — G4730 Sleep apnea, unspecified: Secondary | ICD-10-CM | POA: Diagnosis not present

## 2018-11-06 DIAGNOSIS — Z1159 Encounter for screening for other viral diseases: Secondary | ICD-10-CM | POA: Insufficient documentation

## 2018-11-06 DIAGNOSIS — Z01812 Encounter for preprocedural laboratory examination: Secondary | ICD-10-CM | POA: Diagnosis not present

## 2018-11-06 DIAGNOSIS — X58XXXA Exposure to other specified factors, initial encounter: Secondary | ICD-10-CM | POA: Diagnosis not present

## 2018-11-06 DIAGNOSIS — S83282A Other tear of lateral meniscus, current injury, left knee, initial encounter: Secondary | ICD-10-CM | POA: Diagnosis present

## 2018-11-06 DIAGNOSIS — I4891 Unspecified atrial fibrillation: Secondary | ICD-10-CM | POA: Insufficient documentation

## 2018-11-06 DIAGNOSIS — Z01818 Encounter for other preprocedural examination: Secondary | ICD-10-CM | POA: Insufficient documentation

## 2018-11-06 HISTORY — DX: Cardiac arrhythmia, unspecified: I49.9

## 2018-11-06 LAB — BASIC METABOLIC PANEL
Anion gap: 9 (ref 5–15)
BUN: 16 mg/dL (ref 8–23)
CO2: 28 mmol/L (ref 22–32)
Calcium: 9 mg/dL (ref 8.9–10.3)
Chloride: 102 mmol/L (ref 98–111)
Creatinine, Ser: 0.79 mg/dL (ref 0.61–1.24)
GFR calc Af Amer: >60 mL/min (ref >60)
GFR calc non Af Amer: >60 mL/min (ref >60)
Glucose, Bld: 120 mg/dL — ABNORMAL HIGH (ref 70–99)
Potassium: 3.3 mmol/L — ABNORMAL LOW (ref 3.5–5.1)
Sodium: 139 mmol/L (ref 135–145)

## 2018-11-06 NOTE — Patient Instructions (Signed)
Your procedure is scheduled on: Friday 11/09/18 Report to Day Surgery. To find out your arrival time please call [phone] between 1PM - 3PM on Thurs. 11/08/18.  Remember: Instructions that are not followed completely may result in serious medical risk,  up to and including death, or upon the discretion of your surgeon and anesthesiologist your  surgery may need to be rescheduled.     _X__ 1. Do not eat food after midnight the night before your procedure.                 No gum chewing or hard candies. You may drink clear liquids up to 2 hours                 before you are scheduled to arrive for your surgery- DO not drink clear                 liquids within 2 hours of the start of your surgery.                 carbohydrate complete 3 hours before your surgery.                 __X__2.  On the morning of surgery brush your teeth with toothpaste and water, you                may rinse your mouth with mouthwash if you wish.  Do not swallow any toothpaste of mouthwash.     _X__ 3.  No Alcohol for 24 hours before or after surgery.   ___ 4.  Do Not Smoke or use e-cigarettes For 24 Hours Prior to Your Surgery.                 Do not use any chewable tobacco products for at least 6 hours prior to                 surgery.  ____  5.  Bring all medications with you on the day of surgery if instructed.   __x__  6.  Notify your doctor if there is any change in your medical condition      (cold, fever, infections).     Do not wear jewelry, make-up, hairpins, clips or nail polish. Do not wear lotions, powders, or perfumes. You may wear deodorant. Do not shave 48 hours prior to surgery. Men may shave face and neck. Do not bring valuables to the hospital.    [facility] is not responsible for any belongings or valuables.  Contacts, dentures or bridgework may not be worn into surgery. Leave your suitcase in the car. After surgery it may be brought to your room. For  patients admitted to the hospital, discharge time is determined by your treatment team.   Patients discharged the day of surgery will not be allowed to drive home.   Please read over the following fact sheets that you were given:     __x__ Take these medicines the morning of surgery with A SIP OF WATER:    1. atenolol (TENORMIN) 100 MG tablet  2. cetirizine (ZYRTEC) 10 MG tablet  3. fluticasone (FLONASE) 50 MCG/ACT nasal spray  4.hydrALAZINE (APRESOLINE) 50 MG tablet  5.  6. ____ Fleet Enema (as directed)   __x__ Use CHG Soap as directed  ____ Use inhalers on the day of surgery  ____ Stop metformin 2 days prior to surgery    ____ Take 1/2 of usual insulin dose the night  before surgery. No insulin the morning          of surgery.   _x___ Stop aspirin today  ____ Stop Anti-inflammatories on    ____ Stop supplements until after surgery.    __x__ Bring C-Pap to the hospital.

## 2018-11-06 NOTE — Pre-Procedure Instructions (Signed)
EKG OK BY DR A PENWARDEN

## 2018-11-06 NOTE — Pre-Procedure Instructions (Signed)
Pt recently reduced his Potassium Chloride 10 meq from twice a day to once a day.  Instructed to resume twice a day dosing until after surgery. Will send results to Dr. Marry Guan.

## 2018-11-07 LAB — NOVEL CORONAVIRUS, NAA (HOSP ORDER, SEND-OUT TO REF LAB; TAT 18-24 HRS): SARS-CoV-2, NAA: NOT DETECTED

## 2018-11-08 NOTE — H&P (Addendum)
ORTHOPAEDIC HISTORY & PHYSICAL   Chief Complaint: Patient presents with  . Knee Pain  RECHECK LEFT KNEE   Reason for Visit: The patient is a 63 y.o. male who presents today for reevaluation of his left knee. He reports a several year history of intermittent left knee pain. More recently he has noticed an increase of the knee pain after splitting wood and squatting. He localizes most of the pain along the lateral aspect of the knee. He reports some swelling, no locking, and some giving way of the knee. The pain is aggravated by kneeling, lateral movements, pivoting and squatting. The patient has not appreciated any significant improvement despite Tylenol and activity modification.   Medications: Current Outpatient Medications  Medication Sig Dispense Refill  . albuterol 90 mcg/actuation inhaler Inhale 2 inhalations into the lungs every 6 (six) hours as needed for Wheezing 8.5 g 12  . aspirin 81 MG EC tablet Take 81 mg by mouth once daily.  Marland Kitchen atenolol (TENORMIN) 100 MG tablet Take 1 tablet (100 mg total) by mouth once daily 90 tablet 3  . cetirizine (ZYRTEC) 10 MG tablet  . EPINEPHrine (EPIPEN 2-PAK) 0.3 mg/0.3 mL pen injector Inject 0.3 mg into the muscle once as needed.   . fluticasone propionate (FLONASE) 50 mcg/actuation nasal spray by Nasal route  . Herbal Supplement Herbal Name: Allergy drops  . hydrALAZINE (APRESOLINE) 50 MG tablet TAKE 1 TABLET ONCE DAILY 90 tablet 3  . olmesartan (BENICAR) 40 MG tablet TAKE 1 TABLET ONCE DAILY 90 tablet 3  . potassium chloride (MICRO-K) 10 MEQ ER capsule Take 1 capsule (10 mEq total) by mouth once daily. 90 capsule 3  . sertraline (ZOLOFT) 50 MG tablet Take 1 tablet (50 mg total) by mouth once daily 90 tablet 3   Allergies: Allergen Reactions  . Shellfish Containing Products Shortness Of Breath and Swelling   Past Medical History: . Hypertension  . Sleep apnea   Past Surgical History: . ARTHROSCOPIC ROTATOR CUFF REPAIR Left  . BIOPSY  PROSTATE NEEDLE/PUNCH 04/2017  . COLONOSCOPY 03/30/1999  Hyperplastic Polyps  . COLONOSCOPY 03/28/2008  Int Hemorrhoids, Diverticulosis: CBF 03/2018  . COLONOSCOPY 10/2017  . COLONOSCOPY 10/20/2017  Adenomatous Polyps: CBF 10/2019  . Left knee arthrotomy and lateral meniscectomy   Social History: Marland Kitchen Marital status: Married  Spouse name: Not on file  Tobacco Use  . Smoking status: Never Smoker  . Smokeless tobacco: Never Used  Substance and Sexual Activity  . Alcohol use: Yes  Comment: occasional  . Drug use: No   Family History: Problem Relation Age of Onset  . Leukemia Father  . Myocardial Infarction (Heart attack) Father  . Diabetes type II Mother  . High blood pressure (Hypertension) Mother  . Diabetes type II Brother   Review of Systems: A comprehensive 14 point ROS was performed, reviewed, and the pertinent orthopaedic findings are documented in the HPI.  Exam Ht 188 cm (6\' 2" )  Wt 93.3 kg (205 lb 9.6 oz)  BMI 26.40 kg/m   General:  Well-developed, well-nourished male seen in no acute distress.  Even heel to toe gait.  No varus or valgus thrust to the left knee.  HEENT:  Atraumatic, normocephalic. Pupils are equal and reactive to light. Extraocular motion is intact. Sclera are clear. Oropharynx is clear with moist mucosa.  Lungs:  Clear to auscultation bilaterally.  Cardiovascular: Regular rate and rhythm. Normal S1, S2. No murmur . No appreciable gallops or rubs. Peripheral pulses are palpable. No lower extremity edema. Homan`s  test is negative.   Extremities: Good strength, stability, and range of motion of the upper extremities. Good range of motion of the hips and ankles.  Left Knee:  Soft tissue swelling:  minimal; a prominent popliteal cyst is palpable Effusion:   none Erythema:   none Crepitance:   none Tenderness:   lateral Alignment:   normal Mediolateral laxity:  stable Anterior drawer test: negative Lachman`s test:  negative McMurray`s  test:  positive Atrophy:   No significant atrophy.     Quadriceps tone was good. Range of Motion:  greater than 120 degrees  Neurologic:  Awake, alert, and oriented.  Sensory function is intact to pinprick and light touch.  Motor strength is judged to be 5/5.  Motor coordination is within normal limits.  No apparent clonus. No tremor.   MRI: I reviewed the left knee MRI from [facility] dated 08/15/2018. I concur with the radiologist's interpretation as below:  MRI OF THE LEFT KNEE WITHOUT CONTRAST  TECHNIQUE: Multiplanar, multisequence MR imaging of the knee was performed. No intravenous contrast was administered.  COMPARISON: None.  FINDINGS: MENISCI  Medial meniscus: Intact.  Lateral meniscus: Suspect prior near total meniscectomy. Probable tear involving the residual posterior horn.  LIGAMENTS  Cruciates: Intact  Collaterals: Intact  CARTILAGE  Patellofemoral: Mild degenerative chondrosis.  Medial: Mild degenerative chondrosis with mild cartilage thinning. No significant joint space narrowing or spurring.  Lateral: Severe degenerative chondrosis with areas of full-thickness cartilage loss involving both the tibia and femur. There is also joint space narrowing, spurring and subchondral cystic change.  Joint: Small joint effusion.  Popliteal Fossa: Large Baker's cyst measuring 11 cm in length.  Extensor Mechanism: The patella retinacular structures are intact and the quadriceps and patellar tendons are intact. Mild proximal patellar tendinopathy.  Bones: No acute bony findings.  Other: Normal knee musculature.  IMPRESSION: 1. Evidence of prior surgical changes involving the lateral meniscus with probable near-total meniscectomy. I suspect a recurrent tear involving the residual posterior horn. 2. Intact ligamentous structures and no acute bony findings. 3. Significant lateral compartment degenerative changes. 4. Small  joint effusion and large Baker's cyst.  Electronically Signed By: Marijo Sanes M.D. On: 08/15/2018 13:37   Impression: Internal derangement of the left knee  Plan:  The findings were discussed in detail with the patient. The patient was given informational material on knee arthroscopy. Conservative treatment options were reviewed with the patient. We discussed the risks and benefits of surgical intervention. The usual perioperative course was also discussed in detail. Arthroscopy is an appropriate treatment for the meniscal pathology, but would have limited or no effect on degenerative changes of the articular cartilage. The patient expressed understanding of the risks and benefits of surgical intervention and would like to proceed with plans for left knee arthroscopy.  MEDICAL CLEARANCE: Per anesthesiology. ACTIVITIES:  Avoid pivoting, squatting, or twisting. WORK STATUS: Not applicable. THERAPY: Quadriceps strengthening exercises. MEDICATIONS: No prescriptions requested or ordered in this encounter  FOLLOW-UP: Return for postoperative evaluation as scheduled.   Quantavis Obryant P. Holley Bouche., M.D.  This note was generated in part with voice recognition software and I apologize for any typographical errors that were not detected and corrected.

## 2018-11-09 ENCOUNTER — Other Ambulatory Visit: Payer: Self-pay

## 2018-11-09 ENCOUNTER — Encounter: Payer: Self-pay | Admitting: Anesthesiology

## 2018-11-09 ENCOUNTER — Ambulatory Visit
Admission: RE | Admit: 2018-11-09 | Discharge: 2018-11-09 | Disposition: A | Discharge status: Home / Self Care | Payer: 59 | Attending: Orthopedic Surgery | Admitting: Orthopedic Surgery

## 2018-11-09 ENCOUNTER — Encounter
Admission: RE | Disposition: A | Discharge status: Home / Self Care | Payer: Self-pay | Source: Home / Self Care | Attending: Orthopedic Surgery

## 2018-11-09 ENCOUNTER — Encounter: Payer: Self-pay | Admitting: Emergency Medicine

## 2018-11-09 DIAGNOSIS — Z1159 Encounter for screening for other viral diseases: Secondary | ICD-10-CM | POA: Insufficient documentation

## 2018-11-09 DIAGNOSIS — S83282A Other tear of lateral meniscus, current injury, left knee, initial encounter: Secondary | ICD-10-CM | POA: Diagnosis not present

## 2018-11-09 DIAGNOSIS — G4730 Sleep apnea, unspecified: Secondary | ICD-10-CM | POA: Insufficient documentation

## 2018-11-09 DIAGNOSIS — X58XXXA Exposure to other specified factors, initial encounter: Secondary | ICD-10-CM | POA: Insufficient documentation

## 2018-11-09 DIAGNOSIS — Z01812 Encounter for preprocedural laboratory examination: Secondary | ICD-10-CM | POA: Insufficient documentation

## 2018-11-09 DIAGNOSIS — Z9889 Other specified postprocedural states: Secondary | ICD-10-CM

## 2018-11-09 DIAGNOSIS — Z7982 Long term (current) use of aspirin: Secondary | ICD-10-CM | POA: Insufficient documentation

## 2018-11-09 DIAGNOSIS — I10 Essential (primary) hypertension: Secondary | ICD-10-CM | POA: Insufficient documentation

## 2018-11-09 DIAGNOSIS — Z79899 Other long term (current) drug therapy: Secondary | ICD-10-CM | POA: Insufficient documentation

## 2018-11-09 HISTORY — PX: KNEE ARTHROSCOPY: SHX127

## 2018-11-09 LAB — POCT I-STAT 4, (NA,K, GLUC, HGB,HCT)
Glucose, Bld: 163 mg/dL — ABNORMAL HIGH (ref 70–99)
HCT: 42 % (ref 39.0–52.0)
Hemoglobin: 14.3 g/dL (ref 13.0–17.0)
Potassium: 3.2 mmol/L — ABNORMAL LOW (ref 3.5–5.1)
Sodium: 140 mmol/L (ref 135–145)

## 2018-11-09 SURGERY — ARTHROSCOPY, KNEE
Anesthesia: General | Laterality: Left

## 2018-11-09 MED ORDER — HYDROCODONE-ACETAMINOPHEN 5-325 MG PO TABS: 1.0000 | ORAL_TABLET | ORAL | 0 refills | Status: DC | PRN

## 2018-11-09 MED ORDER — LIDOCAINE HCL (PF) 2 % IJ SOLN
INTRAMUSCULAR | Status: AC
  Filled 2018-11-09: qty 10

## 2018-11-09 MED ORDER — FENTANYL CITRATE (PF) 100 MCG/2ML IJ SOLN
INTRAMUSCULAR | Status: DC | PRN
  Administered 2018-11-09 (×4): 25 ug via INTRAVENOUS

## 2018-11-09 MED ORDER — CELECOXIB 200 MG PO CAPS
400.0000 mg | ORAL_CAPSULE | Freq: Once | ORAL | Status: AC
  Administered 2018-11-09: 400 mg via ORAL

## 2018-11-09 MED ORDER — ONDANSETRON HCL 4 MG PO TABS: 4.0000 mg | ORAL_TABLET | Freq: Four times a day (QID) | ORAL | Status: DC | PRN

## 2018-11-09 MED ORDER — EPHEDRINE SULFATE 50 MG/ML IJ SOLN
INTRAMUSCULAR | Status: DC | PRN
  Administered 2018-11-09 (×3): 10 mg via INTRAVENOUS

## 2018-11-09 MED ORDER — CELECOXIB 200 MG PO CAPS
ORAL_CAPSULE | ORAL | Status: AC
  Administered 2018-11-09: 07:00:00 400 mg via ORAL
  Filled 2018-11-09: qty 2

## 2018-11-09 MED ORDER — MORPHINE SULFATE (PF) 4 MG/ML IV SOLN
INTRAVENOUS | Status: AC
  Filled 2018-11-09: qty 1

## 2018-11-09 MED ORDER — BUPIVACAINE-EPINEPHRINE (PF) 0.25% -1:200000 IJ SOLN
INTRAMUSCULAR | Status: AC
  Filled 2018-11-09: qty 30

## 2018-11-09 MED ORDER — ONDANSETRON HCL 4 MG/2ML IJ SOLN: 4.0000 mg | Freq: Four times a day (QID) | INTRAMUSCULAR | Status: DC | PRN

## 2018-11-09 MED ORDER — GLYCOPYRROLATE 0.2 MG/ML IJ SOLN
INTRAMUSCULAR | Status: DC | PRN
  Administered 2018-11-09: 0.2 mg via INTRAVENOUS

## 2018-11-09 MED ORDER — SUCCINYLCHOLINE CHLORIDE 20 MG/ML IJ SOLN
INTRAMUSCULAR | Status: AC
  Filled 2018-11-09: qty 1

## 2018-11-09 MED ORDER — FENTANYL CITRATE (PF) 100 MCG/2ML IJ SOLN: 25.0000 ug | INTRAMUSCULAR | Status: DC | PRN

## 2018-11-09 MED ORDER — CHLORHEXIDINE GLUCONATE 4 % EX LIQD: 60.0000 mL | Freq: Once | CUTANEOUS | Status: DC

## 2018-11-09 MED ORDER — SODIUM CHLORIDE 0.9 % IV SOLN: INTRAVENOUS | Status: DC

## 2018-11-09 MED ORDER — MIDAZOLAM HCL 2 MG/2ML IJ SOLN
INTRAMUSCULAR | Status: AC
  Filled 2018-11-09: qty 2

## 2018-11-09 MED ORDER — ACETAMINOPHEN 10 MG/ML IV SOLN
INTRAVENOUS | Status: AC
  Filled 2018-11-09: qty 100

## 2018-11-09 MED ORDER — MORPHINE SULFATE 4 MG/ML IJ SOLN
INTRAMUSCULAR | Status: DC | PRN
  Administered 2018-11-09: 4 mg via INTRAVENOUS

## 2018-11-09 MED ORDER — ROCURONIUM BROMIDE 50 MG/5ML IV SOLN
INTRAVENOUS | Status: AC
  Filled 2018-11-09: qty 1

## 2018-11-09 MED ORDER — LACTATED RINGERS IV SOLN
INTRAVENOUS | Status: DC
  Administered 2018-11-09: 07:00:00 via INTRAVENOUS

## 2018-11-09 MED ORDER — FENTANYL CITRATE (PF) 100 MCG/2ML IJ SOLN
INTRAMUSCULAR | Status: AC
  Filled 2018-11-09: qty 2

## 2018-11-09 MED ORDER — PROPOFOL 10 MG/ML IV BOLUS
INTRAVENOUS | Status: AC
  Filled 2018-11-09: qty 20

## 2018-11-09 MED ORDER — METOCLOPRAMIDE HCL 10 MG PO TABS: 5.0000 mg | ORAL_TABLET | Freq: Three times a day (TID) | ORAL | Status: DC | PRN

## 2018-11-09 MED ORDER — LACTATED RINGERS IV SOLN
INTRAVENOUS | Status: DC | PRN
  Administered 2018-11-09: 08:00:00 via INTRAVENOUS

## 2018-11-09 MED ORDER — MIDAZOLAM HCL 2 MG/2ML IJ SOLN
INTRAMUSCULAR | Status: DC | PRN
  Administered 2018-11-09: 2 mg via INTRAVENOUS

## 2018-11-09 MED ORDER — LIDOCAINE HCL (CARDIAC) PF 100 MG/5ML IV SOSY
PREFILLED_SYRINGE | INTRAVENOUS | Status: DC | PRN
  Administered 2018-11-09: 100 mg via INTRAVENOUS

## 2018-11-09 MED ORDER — FAMOTIDINE 20 MG PO TABS
20.0000 mg | ORAL_TABLET | Freq: Once | ORAL | Status: AC
  Administered 2018-11-09: 07:00:00 20 mg via ORAL

## 2018-11-09 MED ORDER — PROMETHAZINE HCL 25 MG/ML IJ SOLN: 6.2500 mg | INTRAMUSCULAR | Status: DC | PRN

## 2018-11-09 MED ORDER — FAMOTIDINE 20 MG PO TABS
ORAL_TABLET | ORAL | Status: AC
  Administered 2018-11-09: 07:00:00 20 mg via ORAL
  Filled 2018-11-09: qty 1

## 2018-11-09 MED ORDER — METOCLOPRAMIDE HCL 5 MG/ML IJ SOLN: 5.0000 mg | Freq: Three times a day (TID) | INTRAMUSCULAR | Status: DC | PRN

## 2018-11-09 MED ORDER — EPHEDRINE SULFATE 50 MG/ML IJ SOLN
INTRAMUSCULAR | Status: AC
  Filled 2018-11-09: qty 1

## 2018-11-09 MED ORDER — DEXAMETHASONE SODIUM PHOSPHATE 10 MG/ML IJ SOLN
INTRAMUSCULAR | Status: DC | PRN
  Administered 2018-11-09: 5 mg via INTRAVENOUS

## 2018-11-09 MED ORDER — PROPOFOL 10 MG/ML IV BOLUS
INTRAVENOUS | Status: DC | PRN
  Administered 2018-11-09: 200 mg via INTRAVENOUS

## 2018-11-09 MED ORDER — BUPIVACAINE-EPINEPHRINE 0.25% -1:200000 IJ SOLN
INTRAMUSCULAR | Status: DC | PRN
  Administered 2018-11-09: 30 mL

## 2018-11-09 MED ORDER — ACETAMINOPHEN 10 MG/ML IV SOLN
INTRAVENOUS | Status: DC | PRN
  Administered 2018-11-09: 1000 mg via INTRAVENOUS

## 2018-11-09 SURGICAL SUPPLY — 29 items
BLADE CLIPPER SURG (BLADE) ×2 IMPLANT
BLADE SHAVER 4.5 DBL SERAT CV (CUTTER) IMPLANT
COVER WAND RF STERILE (DRAPES) ×3 IMPLANT
CUFF TOURN SGL QUICK 24 (TOURNIQUET CUFF) ×2
CUFF TOURN SGL QUICK 30 (TOURNIQUET CUFF)
CUFF TRNQT CYL 24X4X16.5-23 (TOURNIQUET CUFF) IMPLANT
CUFF TRNQT CYL 30X4X21-28X (TOURNIQUET CUFF) IMPLANT
DRAPE U-SHAPE 47X51 STRL (DRAPES) ×2 IMPLANT
DRSG DERMACEA 8X12 NADH (GAUZE/BANDAGES/DRESSINGS) ×3 IMPLANT
DURAPREP 26ML APPLICATOR (WOUND CARE) ×4 IMPLANT
GAUZE SPONGE 4X4 12PLY STRL (GAUZE/BANDAGES/DRESSINGS) ×5 IMPLANT
GLOVE BIOGEL M STRL SZ7.5 (GLOVE) ×3 IMPLANT
GLOVE INDICATOR 8.0 STRL GRN (GLOVE) ×3 IMPLANT
GOWN STRL REUS W/ TWL LRG LVL3 (GOWN DISPOSABLE) ×2 IMPLANT
GOWN STRL REUS W/TWL LRG LVL3 (GOWN DISPOSABLE) ×4
IV LACTATED RINGER IRRG 3000ML (IV SOLUTION) ×12
IV LR IRRIG 3000ML ARTHROMATIC (IV SOLUTION) ×6 IMPLANT
KIT TURNOVER KIT A (KITS) ×3 IMPLANT
MANIFOLD NEPTUNE II (INSTRUMENTS) ×3 IMPLANT
PACK ARTHROSCOPY KNEE (MISCELLANEOUS) ×3 IMPLANT
PAD CAST CTTN 4X4 STRL (SOFTGOODS) IMPLANT
PADDING CAST COTTON 4X4 STRL (SOFTGOODS) ×4
SET TUBE SUCT SHAVER OUTFL 24K (TUBING) ×3 IMPLANT
SET TUBE TIP INTRA-ARTICULAR (MISCELLANEOUS) ×3 IMPLANT
SUT ETHILON 3-0 FS-10 30 BLK (SUTURE) ×3
SUTURE EHLN 3-0 FS-10 30 BLK (SUTURE) ×1 IMPLANT
TUBING ARTHRO INFLOW-ONLY STRL (TUBING) ×3 IMPLANT
WAND HAND CNTRL MULTIVAC 50 (MISCELLANEOUS) ×3 IMPLANT
WRAP KNEE W/COLD PACKS 25.5X14 (SOFTGOODS) ×3 IMPLANT

## 2018-11-09 NOTE — Transfer of Care (Signed)
Immediate Anesthesia Transfer of Care Note  Patient: Adam Glass  Procedure(s) Performed: ARTHROSCOPY KNEE LEFT (Left )  Patient Location: PACU  Anesthesia Type:General  Level of Consciousness: sedated  Airway & Oxygen Therapy: Patient Spontanous Breathing and Patient connected to face mask oxygen  Post-op Assessment: Report given to RN and Post -op Vital signs reviewed and stable  Post vital signs: Reviewed and stable  Last Vitals:  Vitals Value Taken Time  BP    Temp    Pulse    Resp    SpO2      Last Pain:  Vitals:   11/09/18 1014  TempSrc: Temporal  PainSc: 0-No pain         Complications: No apparent anesthesia complications

## 2018-11-09 NOTE — Anesthesia Post-op Follow-up Note (Signed)
Anesthesia QCDR form completed.        

## 2018-11-09 NOTE — Discharge Instructions (Signed)
°  Instructions after Knee Arthroscopy  ° °- James P. Hooten, Jr., M.D.    ° Dept. of Orthopaedics & Sports Medicine ° [facility] ° [address] ° ,   [address] ° ° Phone: [phone]   Fax: [phone] ° ° °DIET: °• Drink plenty of non-alcoholic fluids & begin a light diet. °• Resume your normal diet the day after surgery. ° °ACTIVITY:  °• You may use crutches or a walker with weight-bearing as tolerated, unless instructed otherwise. °• You may wean yourself off of the walker or crutches as tolerated.  °• Begin doing gentle exercises. Exercising will reduce the pain and swelling, increase motion, and prevent muscle weakness.   °• Avoid strenuous activities or athletics for a minimum of 4-6 weeks after arthroscopic surgery. °• Do not drive or operate any equipment until instructed. ° °WOUND CARE:  °• Place one to two pillows under the knee the first day or two when sitting or lying.  °• Continue to use the ice packs periodically to reduce pain and swelling. °• The small incisions in your knee are closed with nylon stitches. The stitches will be removed in the office. °• The bulky dressing may be removed on the second day after surgery. DO NOT TOUCH THE STITCHES. Put a Band-Aid over each stitch. Do NOT use any ointments or creams on the incisions.  °• You may bathe or shower after the stitches are removed at the first office visit following surgery. ° °MEDICATIONS: °• You may resume your regular medications. °• Please take the pain medication as prescribed. °• Do not take pain medication on an empty stomach. °• Do not drive or drink alcoholic beverages when taking pain medications. ° °CALL THE OFFICE FOR: °• Temperature above 101 degrees °• Excessive bleeding or drainage on the dressing. °• Excessive swelling, coldness, or paleness of the toes. °• Persistent nausea and vomiting. ° °FOLLOW-UP:  °• You should have an appointment to return to the office in 7-10 days after surgery.   ° ° °AMBULATORY SURGERY  °DISCHARGE INSTRUCTIONS ° ° °1) The drugs that you were given will stay in your system until tomorrow so for the next 24 hours you should not: ° °A) Drive an automobile °B) Make any legal decisions °C) Drink any alcoholic beverage ° ° °2) You may resume regular meals tomorrow.  Today it is better to start with liquids and gradually work up to solid foods. ° °You may eat anything you prefer, but it is better to start with liquids, then soup and crackers, and gradually work up to solid foods. ° ° °3) Please notify your doctor immediately if you have any unusual bleeding, trouble breathing, redness and pain at the surgery site, drainage, fever, or pain not relieved by medication. ° ° ° °4) Additional Instructions: ° ° ° ° ° ° ° °Please contact your physician with any problems or Same Day Surgery at [phone], Monday through Friday 6 am to 4 pm, or [facility] number at [phone]. ° °

## 2018-11-09 NOTE — Anesthesia Preprocedure Evaluation (Signed)
Anesthesia Evaluation  Patient identified by MRN, date of birth, ID band Patient awake    Reviewed: Allergy & Precautions, H&P , NPO status , Patient's Chart, lab work & pertinent test results, reviewed documented beta blocker date and time   History of Anesthesia Complications Negative for: history of anesthetic complications  Airway Mallampati: I  TM Distance: >3 FB Neck ROM: full    Dental  (+) Dental Advidsory Given, Caps, Teeth Intact   Pulmonary neg shortness of breath, sleep apnea and Continuous Positive Airway Pressure Ventilation , neg COPD, neg recent URI,           Cardiovascular Exercise Tolerance: Good hypertension, (-) angina(-) CAD, (-) Past MI, (-) Cardiac Stents and (-) CABG + dysrhythmias (-) Valvular Problems/Murmurs     Neuro/Psych PSYCHIATRIC DISORDERS Depression negative neurological ROS     GI/Hepatic negative GI ROS, Neg liver ROS,   Endo/Other  negative endocrine ROS  Renal/GU negative Renal ROS  negative genitourinary   Musculoskeletal   Abdominal   Peds  Hematology negative hematology ROS (+)   Anesthesia Other Findings Past Medical History: No date: Change in bowel habits No date: Constipation No date: Hypertension No date: Sleep apnea   Reproductive/Obstetrics negative OB ROS                             Anesthesia Physical  Anesthesia Plan  ASA: II  Anesthesia Plan: General   Post-op Pain Management:    Induction: Intravenous  PONV Risk Score and Plan: 2 and Ondansetron, Dexamethasone, Midazolam, Promethazine and Treatment may vary due to age or medical condition  Airway Management Planned: LMA  Additional Equipment:   Intra-op Plan:   Post-operative Plan: Extubation in OR  Informed Consent: I have reviewed the patients History and Physical, chart, labs and discussed the procedure including the risks, benefits and alternatives for the proposed  anesthesia with the patient or authorized representative who has indicated his/her understanding and acceptance.     Dental Advisory Given  Plan Discussed with: Anesthesiologist, CRNA and Surgeon  Anesthesia Plan Comments:         Anesthesia Quick Evaluation

## 2018-11-09 NOTE — Anesthesia Postprocedure Evaluation (Signed)
Anesthesia Post Note  Patient: Adam Glass  Procedure(s) Performed: ARTHROSCOPY KNEE LEFT (Left )  Patient location during evaluation: PACU Anesthesia Type: General Level of consciousness: awake and alert Pain management: pain level controlled Vital Signs Assessment: post-procedure vital signs reviewed and stable Respiratory status: spontaneous breathing, nonlabored ventilation, respiratory function stable and patient connected to nasal cannula oxygen Cardiovascular status: blood pressure returned to baseline and stable Postop Assessment: no apparent nausea or vomiting Anesthetic complications: no     Last Vitals:  Vitals:   11/09/18 1012 11/09/18 1014  BP:  (!) 158/77  Pulse: (!) 58   Resp: 13 16  Temp:  (!) 36.4 C  SpO2: 97% 96%    Last Pain:  Vitals:   11/09/18 1014  TempSrc: Temporal  PainSc: 0-No pain                 Martha Clan

## 2018-11-09 NOTE — Anesthesia Procedure Notes (Signed)
Procedure Name: LMA Insertion Date/Time: 11/09/2018 7:46 AM Performed by: Justus Memory, CRNA Pre-anesthesia Checklist: Patient identified, Patient being monitored, Timeout performed, Emergency Drugs available and Suction available Patient Re-evaluated:Patient Re-evaluated prior to induction Oxygen Delivery Method: Circle system utilized Preoxygenation: Pre-oxygenation with 100% oxygen Induction Type: IV induction Ventilation: Mask ventilation without difficulty LMA: LMA inserted LMA Size: 3.5 Tube type: Oral Number of attempts: 1 Placement Confirmation: positive ETCO2 and breath sounds checked- equal and bilateral Tube secured with: Tape Dental Injury: Teeth and Oropharynx as per pre-operative assessment

## 2018-11-09 NOTE — Op Note (Signed)
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   ASSISTANT: none  ANESTHESIA: general  ESTIMATED BLOOD LOSS: Minimal  FLUIDS REPLACED: 800 mL of crystalloid  TOURNIQUET TIME: Used  INDICATIONS FOR SURGERY: Adam Glass is a 63 y.o. year old male who has been seen for complaints of left knee pain. MRI demonstrated findings consistent with meniscal pathology. After discussion of the risks and benefits of surgical intervention, the patient expressed understanding of the risks benefits and agree with plans for left knee arthroscopy.   PROCEDURE IN DETAIL: The patient was brought into the operating room and, after adequate general anesthesia was achieved, a tourniquet was applied to the left thigh and the leg was placed in the leg holder. All bony prominences were well padded. The patient's left knee was cleaned and prepped with alcohol and Duraprep and draped in the usual sterile fashion. A "timeout" was performed as per usual protocol. The anticipated portal sites were injected with 0.25% Marcaine with epinephrine. An anterolateral incision was made and a cannula was inserted. A small effusion was evacuated and the knee was distended with fluid using the pump. The scope was advanced down the medial gutter into the medial compartment. Under visualization with the scope, an anteromedial portal was created and a hooked probe was inserted. The medial meniscus was visualized and probed.  The medial meniscus was intact without evidence of tear or instability.  The articular cartilage was visualized and felt to be in  good condition.  The scope was then advanced into the intercondylar notch. The anterior cruciate ligament was visualized and probed and felt to be intact. The scope was removed from the lateral portal and reinserted via the anteromedial portal to better visualize the lateral compartment. The lateral meniscus was visualized and probed.  Remnant of the lateral meniscus was noted posteriorly with gross degenerative changes and flap type lesions.  These areas were debrided using meniscal punches and a 4.5 mm incisor shaver.  Contouring was performed using an ArthroCare wand.  There was also noted to be an area to the anterior horn of the lateral meniscus with a similar lesion.  This area was debrided using meniscal punches and then contoured using the ArthroCare wand.  The articular cartilage of the lateral compartment was visualized.  There were grade 4 changes of chondromalacia noted to the posterior aspect of the lateral tibial plateau as well as along the lateral femoral condyle.  These areas were debrided and contoured using the ArthroCare wand.  Finally, the scope was advanced so as to visualize the patellofemoral articulation. Good patellar tracking was appreciated.  The articular surface was in reasonably good condition.  The knee was irrigated with copius amounts of fluid and suctioned dry. The anterolateral portal was re-approximated with #3-0 nylon. A combination of 0.25% Marcaine with epinephrine and 4 mg of Morphine were injected via the scope. The scope was removed and the anteromedial portal was re-approximated with #3-0 nylon. A sterile dressing was applied followed by application of an ice wrap.  The patient tolerated the procedure well and was transported to the PACU in stable condition.  James P. Holley Bouche., M.D.

## 2018-11-10 ENCOUNTER — Encounter: Payer: Self-pay | Admitting: Orthopedic Surgery

## 2019-04-23 ENCOUNTER — Other Ambulatory Visit: Payer: Self-pay

## 2019-04-23 ENCOUNTER — Other Ambulatory Visit: Payer: 59

## 2019-04-23 ENCOUNTER — Other Ambulatory Visit: Payer: Self-pay | Admitting: Urology

## 2019-04-23 DIAGNOSIS — C61 Malignant neoplasm of prostate: Secondary | ICD-10-CM

## 2019-04-24 LAB — PSA: Prostate Specific Ag, Serum: 3.2 ng/mL (ref 0.0–4.0)

## 2019-04-25 ENCOUNTER — Encounter: Payer: Self-pay | Admitting: Urology

## 2019-04-25 ENCOUNTER — Other Ambulatory Visit: Payer: Self-pay

## 2019-04-25 ENCOUNTER — Ambulatory Visit: Payer: 59 | Admitting: Urology

## 2019-04-25 VITALS — BP 178/93 | HR 69 | Ht 76.0 in | Wt 215.0 lb

## 2019-04-25 DIAGNOSIS — C61 Malignant neoplasm of prostate: Secondary | ICD-10-CM | POA: Diagnosis not present

## 2019-04-25 NOTE — Progress Notes (Signed)
04/25/2019 11:46 AM   Adam Glass [birth year] [id number]  Referring provider: Rusty Aus, MD [facility] [place],  [place] [address]  Chief Complaint  Patient presents with  . 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 -Biopsy positive Gleason 3+3 adenocarcinoma(5%)left mid core -Volume 59.9 cc -Confirmatory biopsy 05/2018; volume 60 cc -All cores negative for prostate cancer.  Focal acute, chronic and granulomatous inflammation  HPI: 63 y.o. male presents for semiannual follow-up of prostate cancer.  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  He presently is doing well.  He has mild lower urinary tract symptoms which are not bothersome.  Denies dysuria or gross hematuria.  PSA performed 04/23/2019 minimally increased at 3.2   PMH: Past Medical History:  Diagnosis Date  . Arthritis   . Atrial fibrillation ([place])   . Change in bowel habits   . Constipation   . Depression   . Dysrhythmia    A fib  . Hypertension   . Sleep apnea    CPAP    Surgical History: Past Surgical History:  Procedure Laterality Date  . COLONOSCOPY    . COLONOSCOPY WITH PROPOFOL N/A 10/20/2017   Procedure: COLONOSCOPY WITH PROPOFOL;  Surgeon: Manya Silvas, MD;  Location: [facility] ENDOSCOPY;  Service: Endoscopy;  Laterality: N/A;  . KNEE ARTHROSCOPY Left   . KNEE ARTHROSCOPY Left 11/09/2018   Procedure: ARTHROSCOPY KNEE LEFT;  Surgeon: Dereck Leep, MD;  Location: [facility] ORS;  Service: Orthopedics;  Laterality: Left;  . ROTATOR CUFF REPAIR Left   . VASECTOMY      Home Medications:  Allergies as of 04/25/2019      Reactions   Shellfish Allergy Shortness Of Breath, Swelling   Throat/tongue swells      Medication List       Accurate as of April 25, 2019 11:46 AM. If you have any questions, ask your nurse or doctor.        acetaminophen 650 MG CR tablet Commonly known as: TYLENOL Take 1,300 mg by mouth every 8 (eight) hours as needed for pain.   aspirin EC 81 MG tablet Take 81 mg by mouth daily.   atenolol 100 MG tablet Commonly known as: TENORMIN Take 100 mg by mouth daily.   cetirizine 10 MG tablet Commonly known as: ZYRTEC Take 10 mg by mouth daily.   EpiPen 2-Pak 0.3 mg/0.3 mL Soaj injection Generic drug: EPINEPHrine Inject 0.3 mg into the muscle as needed for anaphylaxis.   fluticasone 50 MCG/ACT nasal spray Commonly known as: FLONASE Place 1 spray into both nostrils daily as needed (allergies.).   hydrALAZINE 50 MG tablet Commonly known as: APRESOLINE Take 50 mg by mouth daily.   HYDROcodone-acetaminophen 5-325 MG tablet Commonly known as: Norco Take 1-2 tablets by mouth every 4 (four) hours as needed for moderate pain.   NON FORMULARY Place 3 drops under the tongue every evening. Sublingual Allergy Drops Immunotherapy   olmesartan 40 MG tablet Commonly known as: BENICAR Take 40 mg by mouth every evening.   olopatadine 0.1 % ophthalmic solution Commonly known as: PATANOL Place 1 drop into both eyes daily as needed for allergies (dryness).   potassium chloride 10 MEQ CR capsule Commonly known as: MICRO-K Take 10 mEq by mouth daily.   sildenafil 20 MG tablet Commonly known as: REVATIO Take 40  mg by mouth daily as needed (erectile dysfunction.).       Allergies:  Allergies  Allergen Reactions  . Shellfish Allergy Shortness Of Breath and Swelling    Throat/tongue swells    Family History: Family History  Problem Relation Age of Onset  . Heart attack Father     Social History:  reports that he has never smoked. He has never used smokeless tobacco. He reports current alcohol use. He reports that he does not use drugs.  ROS: UROLOGY Frequent Urination?: No Hard to postpone urination?: Yes  Burning/pain with urination?: No Get up at night to urinate?: No Leakage of urine?: No Urine stream starts and stops?: No Trouble starting stream?: No Do you have to strain to urinate?: No Blood in urine?: No Urinary tract infection?: No Sexually transmitted disease?: No Injury to kidneys or bladder?: No Painful intercourse?: No Weak stream?: No Erection problems?: Yes Penile pain?: No  Gastrointestinal Nausea?: No Vomiting?: No Indigestion/heartburn?: No Diarrhea?: No Constipation?: No  Constitutional Fever: No Night sweats?: No Weight loss?: No Fatigue?: No  Skin Skin rash/lesions?: No Itching?: No  Eyes Blurred vision?: No Double vision?: No  Ears/Nose/Throat Sore throat?: No Sinus problems?: No  Hematologic/Lymphatic Swollen glands?: No Easy bruising?: No  Cardiovascular Leg swelling?: No Chest pain?: No  Respiratory Cough?: No Shortness of breath?: No  Endocrine Excessive thirst?: No  Musculoskeletal Back pain?: Yes Joint pain?: Yes  Neurological Headaches?: No Dizziness?: No  Psychologic Depression?: No Anxiety?: No  Physical Exam: BP (!) 178/93   Pulse 69   Ht 6\' 4"  (1.93 m)   Wt 215 lb (97.5 kg)   BMI 26.17 kg/m   Constitutional:  Alert and oriented, No acute distress. HEENT: [place] AT, moist mucus membranes.  Trachea midline, no masses. Cardiovascular: No clubbing, cyanosis, or edema. Respiratory: Normal respiratory effort, no increased work of breathing. GU: Prostate 60 g, smooth without nodules Neurologic: Grossly intact, no focal deficits, moving all 4 extremities. Psychiatric: Normal mood and affect.   Assessment & Plan:    - T1c very low risk prostate cancer PSA minimally increased however recent prostate MRI and confirmatory biopsy showed no evidence of high risk disease.  Follow-up 6 months.   Abbie Sons, [facility] [address] [place], [place] [address] [phone]

## 2019-04-26 ENCOUNTER — Encounter: Payer: Self-pay | Admitting: Urology

## 2019-09-19 ENCOUNTER — Other Ambulatory Visit: Payer: Self-pay | Admitting: Sports Medicine

## 2019-09-19 DIAGNOSIS — M4310 Spondylolisthesis, site unspecified: Secondary | ICD-10-CM

## 2019-09-19 DIAGNOSIS — M47816 Spondylosis without myelopathy or radiculopathy, lumbar region: Secondary | ICD-10-CM

## 2019-09-19 DIAGNOSIS — G8929 Other chronic pain: Secondary | ICD-10-CM

## 2019-09-19 DIAGNOSIS — M5137 Other intervertebral disc degeneration, lumbosacral region: Secondary | ICD-10-CM

## 2019-09-30 ENCOUNTER — Ambulatory Visit
Admission: RE | Admit: 2019-09-30 | Discharge: 2019-09-30 | Disposition: A | Discharge status: Home / Self Care | Payer: 59 | Source: Ambulatory Visit | Attending: Sports Medicine | Admitting: Sports Medicine

## 2019-09-30 ENCOUNTER — Other Ambulatory Visit: Payer: Self-pay

## 2019-09-30 DIAGNOSIS — M47816 Spondylosis without myelopathy or radiculopathy, lumbar region: Secondary | ICD-10-CM | POA: Insufficient documentation

## 2019-09-30 DIAGNOSIS — G8929 Other chronic pain: Secondary | ICD-10-CM | POA: Diagnosis present

## 2019-09-30 DIAGNOSIS — M4310 Spondylolisthesis, site unspecified: Secondary | ICD-10-CM | POA: Diagnosis present

## 2019-09-30 DIAGNOSIS — M545 Low back pain: Secondary | ICD-10-CM | POA: Diagnosis not present

## 2019-09-30 DIAGNOSIS — M5137 Other intervertebral disc degeneration, lumbosacral region: Secondary | ICD-10-CM | POA: Insufficient documentation

## 2019-10-09 DIAGNOSIS — M47816 Spondylosis without myelopathy or radiculopathy, lumbar region: Secondary | ICD-10-CM | POA: Insufficient documentation

## 2019-10-21 ENCOUNTER — Other Ambulatory Visit: Payer: 59

## 2019-10-21 ENCOUNTER — Other Ambulatory Visit: Payer: Self-pay

## 2019-10-21 ENCOUNTER — Other Ambulatory Visit: Payer: Self-pay | Admitting: Family Medicine

## 2019-10-21 DIAGNOSIS — C61 Malignant neoplasm of prostate: Secondary | ICD-10-CM

## 2019-10-22 LAB — PSA: Prostate Specific Ag, Serum: 3.3 ng/mL (ref 0.0–4.0)

## 2019-10-23 ENCOUNTER — Ambulatory Visit: Payer: 59 | Admitting: Urology

## 2019-10-23 ENCOUNTER — Other Ambulatory Visit: Payer: Self-pay

## 2019-10-23 ENCOUNTER — Encounter: Payer: Self-pay | Admitting: Urology

## 2019-10-23 VITALS — BP 166/92 | HR 55 | Ht 76.0 in | Wt 220.0 lb

## 2019-10-23 DIAGNOSIS — C61 Malignant neoplasm of prostate: Secondary | ICD-10-CM

## 2019-10-23 NOTE — Progress Notes (Signed)
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 -Biopsy positive Gleason 3+3 adenocarcinoma(5%)left mid core -Volume 59.9 cc -Confirmatory biopsy 05/2018; volume 60 cc -All cores negative for prostate cancer. Focal acute, chronic and granulomatous inflammation  HPI: 64 y.o. male presents for semiannual follow-up of prostate cancer.    - PSA performed 10/21/2019 stable at 3.3 - No bothersome LUTS -PSA trend:     PMH: Past Medical History: Diagnosis Date . Arthritis  . Atrial fibrillation ([place])  . Change in bowel habits  . Constipation  . Depression  . Dysrhythmia   A fib . Hypertension  . Sleep apnea   CPAP   Surgical History: Past Surgical History: Procedure Laterality Date . COLONOSCOPY   . COLONOSCOPY WITH PROPOFOL N/A 10/20/2017  Procedure: COLONOSCOPY WITH PROPOFOL;  Surgeon: Manya Silvas, MD;  Location: [facility] ENDOSCOPY;  Service: Endoscopy;  Laterality: N/A; . KNEE ARTHROSCOPY Left  . KNEE ARTHROSCOPY Left 11/09/2018  Procedure: ARTHROSCOPY KNEE LEFT;  Surgeon: Dereck Leep, MD;  Location: [facility] ORS;  Service: Orthopedics;  Laterality: Left; . ROTATOR CUFF REPAIR Left  . VASECTOMY     Home Medications:  Allergies as of 10/23/2019     Reactions  Shellfish Allergy Shortness Of Breath, Swelling  Throat/tongue swells   Medication List    Accurate as of Oct 22, 2019 11:30 PM. If you have any questions, ask your nurse or doctor.    acetaminophen 650 MG CR tablet Commonly known as: TYLENOL Take 1,300 mg by mouth every 8 (eight) hours as needed for pain.  aspirin EC 81 MG tablet Take 81 mg by mouth daily.  atenolol 100 MG  tablet Commonly known as: TENORMIN Take 100 mg by mouth daily.  cetirizine 10 MG tablet Commonly known as: ZYRTEC Take 10 mg by mouth daily.  EpiPen 2-Pak 0.3 mg/0.3 mL Soaj injection Generic drug: EPINEPHrine Inject 0.3 mg into the muscle as needed for anaphylaxis.  fluticasone 50 MCG/ACT nasal spray Commonly known as: FLONASE Place 1 spray into both nostrils daily as needed (allergies.).  hydrALAZINE 50 MG tablet Commonly known as: APRESOLINE Take 50 mg by mouth daily.  HYDROcodone-acetaminophen 5-325 MG tablet Commonly known as: Norco Take 1-2 tablets by mouth every 4 (four) hours as needed for moderate pain.  NON FORMULARY Place 3 drops under the tongue every evening. Sublingual Allergy Drops Immunotherapy  olmesartan 40 MG tablet Commonly known as: BENICAR Take 40 mg by mouth every evening.  olopatadine 0.1 % ophthalmic solution Commonly known as: PATANOL Place 1 drop into both eyes daily as needed for allergies (dryness).  potassium chloride 10 MEQ CR capsule Commonly known as: MICRO-K Take 10 mEq by mouth daily.  sildenafil 20 MG tablet Commonly known as: REVATIO Take 40 mg by mouth daily as needed (erectile dysfunction.).     Allergies:  Allergies Allergen Reactions . Shellfish Allergy Shortness Of Breath and Swelling   Throat/tongue swells   Family History: Family History Problem Relation Age of Onset . Heart attack Father    Social History:  reports that he has never smoked. He has never used smokeless tobacco. He reports current alcohol use. He reports that he does not use drugs.   Physical Exam: There were no vitals taken for this  visit.  Constitutional:  Alert and oriented, No acute distress. HEENT: [place] AT, moist mucus membranes.  Trachea midline, no masses. Cardiovascular: No clubbing, cyanosis, or edema. Respiratory: Normal respiratory effort, no increased work of breathing. GU: Prostate: 60 grams, smooth without nodules Skin: No  rashes, bruises or suspicious lesions. Neurologic: Grossly intact, no focal deficits, moving all 4 extremities. Psychiatric: Normal mood and affect.    Assessment & Plan:    1. T1c very low risk prostate cancer -PSA/ DRE stable, continue active surveillance with 6 month PSA and DRE follow-up  [facility] [address], [place], [place] [address] [phone]  I, Joneen Boers Peace, am acting as a Education administrator for Dr. Nicki Reaper C. Stoioff.  I have reviewed the above documentation for accuracy and completeness, and I agree with the above.   Abbie Sons, MD

## 2020-04-15 ENCOUNTER — Other Ambulatory Visit: Payer: Self-pay

## 2020-04-15 ENCOUNTER — Other Ambulatory Visit: Payer: 59

## 2020-04-15 DIAGNOSIS — C61 Malignant neoplasm of prostate: Secondary | ICD-10-CM

## 2020-04-16 LAB — PSA: Prostate Specific Ag, Serum: 3.5 ng/mL (ref 0.0–4.0)

## 2020-04-22 ENCOUNTER — Encounter: Payer: Self-pay | Admitting: Urology

## 2020-04-22 ENCOUNTER — Ambulatory Visit (INDEPENDENT_AMBULATORY_CARE_PROVIDER_SITE_OTHER): Payer: 59 | Admitting: Urology

## 2020-04-22 ENCOUNTER — Other Ambulatory Visit: Payer: Self-pay

## 2020-04-22 ENCOUNTER — Ambulatory Visit: Payer: Self-pay | Admitting: Urology

## 2020-04-22 VITALS — BP 128/70 | HR 72 | Ht 76.0 in | Wt 200.0 lb

## 2020-04-22 DIAGNOSIS — C61 Malignant neoplasm of prostate: Secondary | ICD-10-CM | POA: Diagnosis not present

## 2020-04-22 NOTE — Progress Notes (Signed)
04/22/2020 11:43 AM   Adam Glass [birth year] [id number]  Referring provider: Rusty Aus, MD [facility] [place],  [place] [address]  Chief Complaint  Patient presents with  . 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 -Biopsy positive Gleason 3+3 adenocarcinoma(5%)left mid core -Volume 59.9 cc -Confirmatory biopsy 05/2018; volume 60 cc -All cores negative for prostate cancer. Focal acute, chronic and granulomatous inflammation   HPI: 64 y.o. male presents for semiannual follow-up of prostate cancer.   No complaints since last visit  No bothersome LUTS  Some increased frequency/urgency attributed to increased hydration  Denies dysuria, gross hematuria  Denies flank, abdominal or pelvic pain  PSA 04/15/2020 stable at 3.5       PMH: Past Medical History:  Diagnosis Date  . Arthritis   . Atrial fibrillation ([place])   . Change in bowel habits   . Constipation   . Depression   . Dysrhythmia    A fib  . Hypertension   . Sleep apnea    CPAP    Surgical History: Past Surgical History:  Procedure Laterality Date  . COLONOSCOPY    . COLONOSCOPY WITH PROPOFOL N/A 10/20/2017   Procedure: COLONOSCOPY WITH PROPOFOL;  Surgeon: Manya Silvas, MD;  Location: [facility] ENDOSCOPY;  Service: Endoscopy;  Laterality: N/A;  . KNEE ARTHROSCOPY Left   . KNEE ARTHROSCOPY Left 11/09/2018   Procedure: ARTHROSCOPY KNEE LEFT;  Surgeon: Dereck Leep, MD;  Location: [facility] ORS;  Service: Orthopedics;  Laterality: Left;  . ROTATOR CUFF REPAIR Left   . VASECTOMY      Home Medications:  Allergies as of 04/22/2020      Reactions   Shellfish Allergy Shortness Of Breath, Swelling   Throat/tongue swells      Medication List       Accurate as of April 22, 2020 11:43  AM. If you have any questions, ask your nurse or doctor.        acetaminophen 650 MG CR tablet Commonly known as: TYLENOL Take 1,300 mg by mouth every 8 (eight) hours as needed for pain.   aspirin EC 81 MG tablet Take 81 mg by mouth daily.   atenolol 100 MG tablet Commonly known as: TENORMIN Take 100 mg by mouth daily.   cetirizine 10 MG tablet Commonly known as: ZYRTEC Take 10 mg by mouth daily.   EpiPen 2-Pak 0.3 mg/0.3 mL Soaj injection Generic drug: EPINEPHrine Inject 0.3 mg into the muscle as needed for anaphylaxis.   fluticasone 50 MCG/ACT nasal spray Commonly known as: FLONASE Place 1 spray into both nostrils daily as needed (allergies.).   hydrALAZINE 50 MG tablet Commonly known as: APRESOLINE Take 50 mg by mouth daily.   HYDROcodone-acetaminophen 5-325 MG tablet Commonly known as: Norco Take 1-2 tablets by mouth every 4 (four) hours as needed for moderate pain.   NON FORMULARY Place 3 drops under the tongue every evening. Sublingual Allergy Drops Immunotherapy   olmesartan 40 MG tablet Commonly known as: BENICAR Take 40 mg by mouth every evening.   olopatadine 0.1 % ophthalmic solution Commonly known as: PATANOL Place 1 drop into both eyes daily as needed for allergies (dryness).   potassium chloride 10 MEQ CR capsule Commonly known as: MICRO-K Take 10 mEq by mouth daily.   sildenafil 20 MG tablet Commonly known as: REVATIO Take 40 mg by mouth  daily as needed (erectile dysfunction.).       Allergies:  Allergies  Allergen Reactions  . Shellfish Allergy Shortness Of Breath and Swelling    Throat/tongue swells    Family History: Family History  Problem Relation Age of Onset  . Heart attack Father     Social History:  reports that he has never smoked. He has never used smokeless tobacco. He reports current alcohol use. He reports that he does not use drugs.   Physical Exam: BP 128/70   Pulse 72   Ht 6\' 4"  (1.93 m)   Wt 200 lb (90.7 kg)    BMI 24.34 kg/m   Constitutional:  Alert, No acute distress. HEENT: [place] AT, moist mucus membranes.  Trachea midline, no masses. Cardiovascular: No clubbing, cyanosis, or edema. Respiratory: Normal respiratory effort, no increased work of breathing. GU: Prostate 60 cc, smooth without nodules Skin: No rashes, bruises or suspicious lesions. Neurologic: Grossly intact, no focal deficits, moving all 4 extremities. Psychiatric: Normal mood and affect.   Assessment & Plan:    1.  T1c prostate cancer (NCCN very low risk)  Benign DRE  Slow upward PSA trend but within acceptable limits  Continue semiannual follow-up  Abbie Sons, MD  [facility] [address], [place], [place] [address] [phone]

## 2020-09-23 ENCOUNTER — Other Ambulatory Visit: Payer: Self-pay | Admitting: Physical Medicine & Rehabilitation

## 2020-09-23 DIAGNOSIS — M5442 Lumbago with sciatica, left side: Secondary | ICD-10-CM

## 2020-09-24 ENCOUNTER — Ambulatory Visit
Admission: RE | Admit: 2020-09-24 | Discharge: 2020-09-24 | Disposition: A | Discharge status: Home / Self Care | Payer: 59 | Source: Ambulatory Visit | Attending: Physical Medicine & Rehabilitation | Admitting: Physical Medicine & Rehabilitation

## 2020-09-24 ENCOUNTER — Other Ambulatory Visit: Payer: Self-pay

## 2020-09-24 DIAGNOSIS — M5442 Lumbago with sciatica, left side: Secondary | ICD-10-CM | POA: Diagnosis not present

## 2020-09-29 ENCOUNTER — Ambulatory Visit: Payer: 59

## 2021-04-15 ENCOUNTER — Other Ambulatory Visit: Payer: Self-pay

## 2021-04-15 DIAGNOSIS — C61 Malignant neoplasm of prostate: Secondary | ICD-10-CM

## 2021-04-16 ENCOUNTER — Other Ambulatory Visit: Payer: Medicare HMO

## 2021-04-16 ENCOUNTER — Other Ambulatory Visit: Payer: Self-pay

## 2021-04-16 DIAGNOSIS — C61 Malignant neoplasm of prostate: Secondary | ICD-10-CM

## 2021-04-17 LAB — PSA: Prostate Specific Ag, Serum: 3.6 ng/mL (ref 0.0–4.0)

## 2021-04-19 ENCOUNTER — Other Ambulatory Visit: Payer: Self-pay

## 2021-04-22 ENCOUNTER — Encounter: Payer: Self-pay | Admitting: Urology

## 2021-04-22 ENCOUNTER — Ambulatory Visit: Payer: Medicare HMO | Admitting: Urology

## 2021-04-22 ENCOUNTER — Other Ambulatory Visit: Payer: Self-pay

## 2021-04-22 VITALS — BP 138/80 | HR 74 | Ht 72.0 in | Wt 200.0 lb

## 2021-04-22 DIAGNOSIS — C61 Malignant neoplasm of prostate: Secondary | ICD-10-CM

## 2021-04-22 DIAGNOSIS — N5201 Erectile dysfunction due to arterial insufficiency: Secondary | ICD-10-CM | POA: Diagnosis not present

## 2021-04-22 MED ORDER — TADALAFIL 10 MG PO TABS: ORAL_TABLET | ORAL | 0 refills | Status: DC

## 2021-04-22 NOTE — Progress Notes (Signed)
04/22/2021 2:39 PM   Adam Glass 01-14-1956 [id number]  Referring provider: Rusty Aus, MD [facility] [place],  [place] [address]  Chief Complaint  Patient presents with   Prostate Cancer    Urologic history:  1. 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 -Biopsy positive Gleason 3+3 adenocarcinoma (5%) left mid core -Volume 59.9 cc -Confirmatory biopsy 05/2018; volume 60 cc -All cores negative for prostate cancer.  Focal acute, chronic and granulomatous inflammation   2.  Erectile dysfunction   HPI: 65 y.o. male presents for annual follow-up.  Doing well since last visit No bothersome LUTS Denies dysuria, gross hematuria Denies flank, abdominal or pelvic pain 56-month interim PSA performed by PCP was 3.46 and PSA drawn 04/16/2021 stable at 3.6 Using sildenafil 40 mg for ED and does complain of nasal stuffiness/congestion     PMH: Past Medical History:  Diagnosis Date   Arthritis    Atrial fibrillation ([place])    Change in bowel habits    Constipation    Depression    Dysrhythmia    A fib   Hypertension    Sleep apnea    CPAP    Surgical History: Past Surgical History:  Procedure Laterality Date   COLONOSCOPY     COLONOSCOPY WITH PROPOFOL N/A 10/20/2017   Procedure: COLONOSCOPY WITH PROPOFOL;  Surgeon: Manya Silvas, MD;  Location: [facility] ENDOSCOPY;  Service: Endoscopy;  Laterality: N/A;   KNEE ARTHROSCOPY Left    KNEE ARTHROSCOPY Left 11/09/2018   Procedure: ARTHROSCOPY KNEE LEFT;  Surgeon: Dereck Leep, MD;  Location: [facility] ORS;  Service: Orthopedics;  Laterality: Left;   ROTATOR CUFF REPAIR Left    VASECTOMY      Home Medications:  Allergies as of 04/22/2021       Reactions   Shellfish Allergy Shortness Of Breath, Swelling   Throat/tongue swells        Medication List         Accurate as of April 22, 2021  2:39 PM. If you have any questions, ask your nurse or doctor.          STOP taking these medications    HYDROcodone-acetaminophen 5-325 MG tablet Commonly known as: Norco Stopped by: Abbie Sons, MD       TAKE these medications    acetaminophen 650 MG CR tablet Commonly known as: TYLENOL Take 1,300 mg by mouth every 8 (eight) hours as needed for pain.   aspirin EC 81 MG tablet Take 81 mg by mouth daily.   atenolol 100 MG tablet Commonly known as: TENORMIN Take 100 mg by mouth daily.   cetirizine 10 MG tablet Commonly known as: ZYRTEC Take 10 mg by mouth daily.   EPINEPHrine 0.3 mg/0.3 mL Soaj injection Commonly known as: EPI-PEN Inject 0.3 mg into the muscle as needed for anaphylaxis.   fluticasone 50 MCG/ACT nasal spray Commonly known as: FLONASE Place 1 spray into both nostrils daily as needed (allergies.).   hydrALAZINE 50 MG tablet Commonly known as: APRESOLINE Take 50 mg by mouth daily.   NON FORMULARY Place 3 drops under the tongue every evening. Sublingual Allergy Drops Immunotherapy   olmesartan 40 MG tablet Commonly known as: BENICAR Take 40 mg by mouth every evening.   olopatadine 0.1 % ophthalmic solution Commonly known as: PATANOL Place 1 drop into both eyes daily as needed for allergies (dryness).  potassium chloride 10 MEQ CR capsule Commonly known as: MICRO-K Take 10 mEq by mouth daily.   sildenafil 20 MG tablet Commonly known as: REVATIO Take 40 mg by mouth daily as needed (erectile dysfunction.).        Allergies:  Allergies  Allergen Reactions   Shellfish Allergy Shortness Of Breath and Swelling    Throat/tongue swells    Family History: Family History  Problem Relation Age of Onset   Heart attack Father     Social History:  reports that he has never smoked. He has never used smokeless tobacco. He reports current alcohol use. He reports that he does not use  drugs.   Physical Exam: BP 138/80   Pulse 74   Ht 6' (1.829 m)   Wt 200 lb (90.7 kg)   BMI 27.12 kg/m   Constitutional:  Alert and oriented, No acute distress. HEENT: [place] AT, moist mucus membranes.  Trachea midline, no masses. Cardiovascular: No clubbing, cyanosis, or edema. Respiratory: Normal respiratory effort, no increased work of breathing. GU: Prostate 50 g, smooth without nodules Psychiatric: Normal mood and affect.   Assessment & Plan:    1.  T1c adenocarcinoma prostate-low risk Stable PSA Benign DRE 65-month interim PSA by PCP 1 year follow-up with DRE  2.  Erectile dysfunction Sildenafil has been effective but bothersome nasal congestion He was interested in a trial of Cialis and 10 mg tabs sent to pharmacy 10-20 mg 1 hour prior to intercourse   Abbie Sons, MD  [facility] [address] [place], [place] [address] [phone]

## 2021-04-25 ENCOUNTER — Encounter: Payer: Self-pay | Admitting: Urology

## 2021-06-22 ENCOUNTER — Encounter: Payer: Self-pay | Admitting: Urology

## 2021-06-22 MED ORDER — TADALAFIL 10 MG PO TABS: ORAL_TABLET | ORAL | 0 refills | Status: DC

## 2021-07-29 IMAGING — MR MR LUMBAR SPINE W/O CM
5 series · 31 of 48 positions shown · non-contrast
Comparison: 09/30/2019

CLINICAL DATA: Low back pain and left leg pain worsened over the
last 2 weeks.

EXAM:
MRI LUMBAR SPINE WITHOUT CONTRAST
TECHNIQUE: Multiplanar, multisequence MR imaging of the lumbar spine was
performed. No intravenous contrast was administered.

[Series 5: T2 · sagittal · 4.0mm · 0.81mm/px · 6 of 17 slices shown (1 of 2)]
[im 1/17]
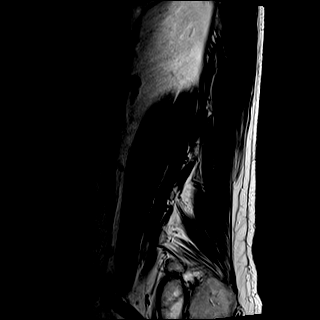
[im 4/17]
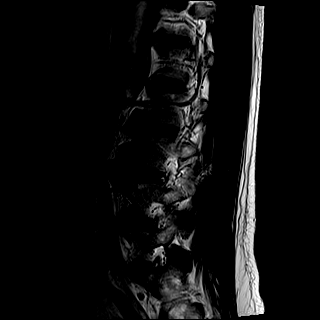
[im 7/17]
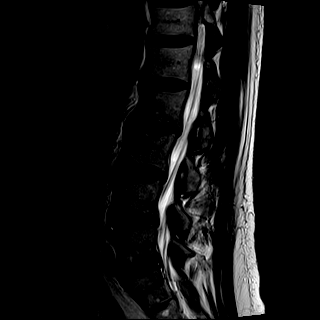
[im 10/17]
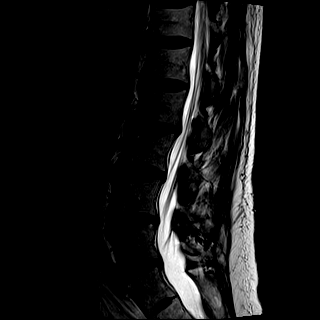
[im 13/17]
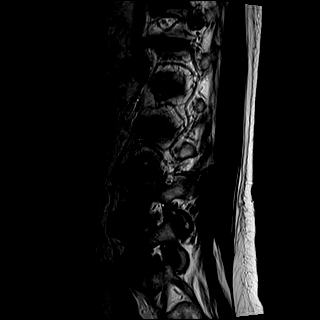
[im 17/17]
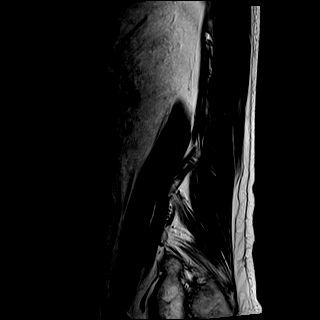

[Series 6: T1 · sagittal · 4.0mm · 0.81mm/px · 6 of 17 slices shown (1 of 2)]
[im 1/17]
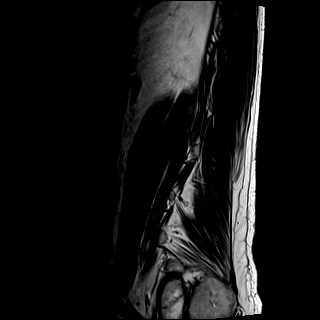
[im 4/17]
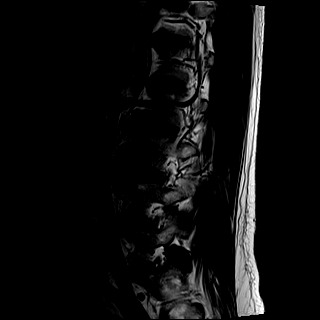
[im 7/17]
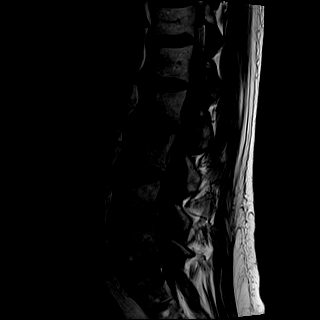
[im 10/17]
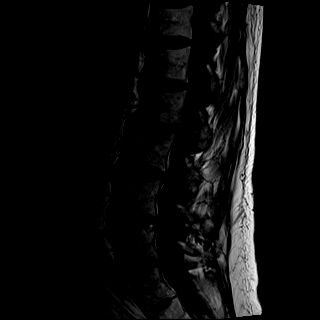
[im 13/17]
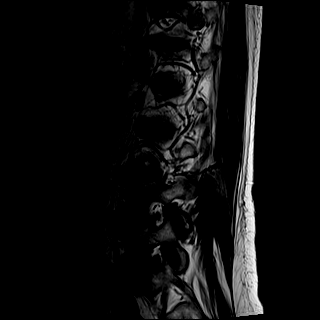
[im 17/17]
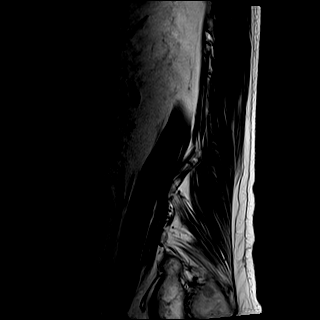

[Series 7: STIR · sagittal · 4.0mm · 0.41mm/px · 1 of 17 slices shown]
[im 1/17]
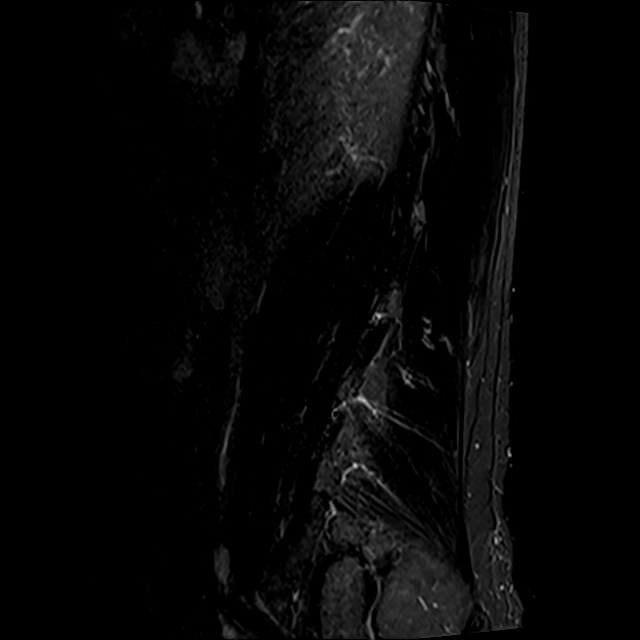

[Series 8: T2 · axial · 4.0mm · 0.78mm/px · z∈[-80,+166]mm · 9 of 40 slices shown (2 of 2)]
[im 1/40]
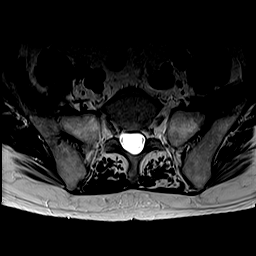
[im 6/40]
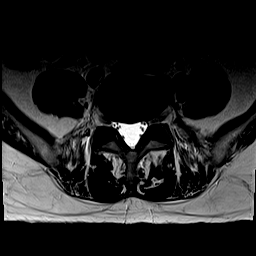
[im 12/40]
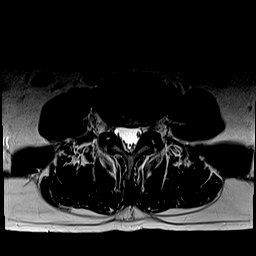
[im 17/40]
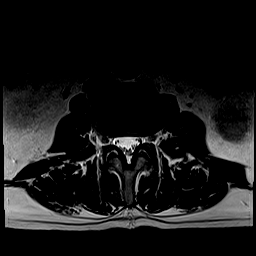
[im 20/40]
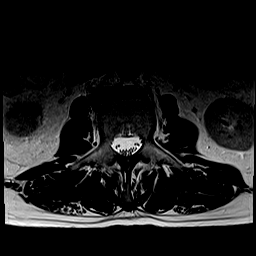
[im 23/40]
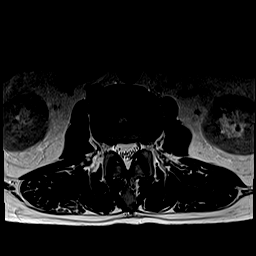
[im 28/40]
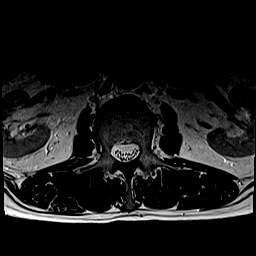
[im 34/40]
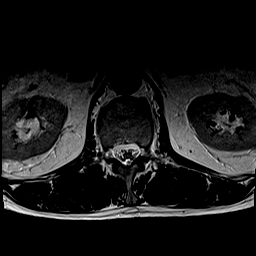
[im 40/40]
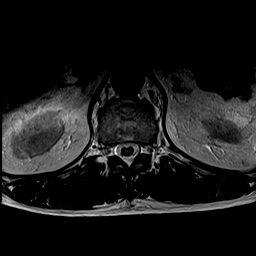

[Series 9: T1 · axial · 4.0mm · 0.39mm/px · z∈[-80,+166]mm · 9 of 40 slices shown (2 of 2)]
[im 1/40]
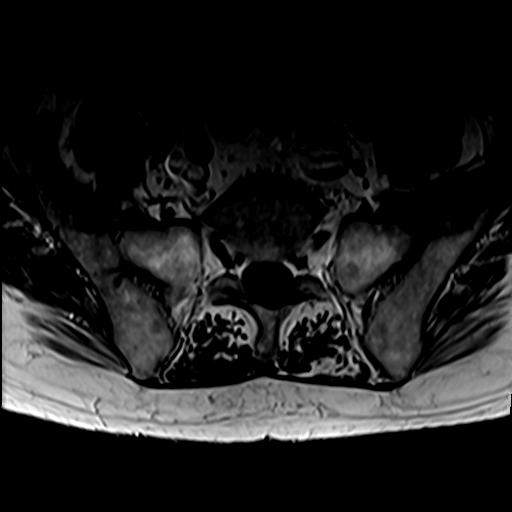
[im 6/40]
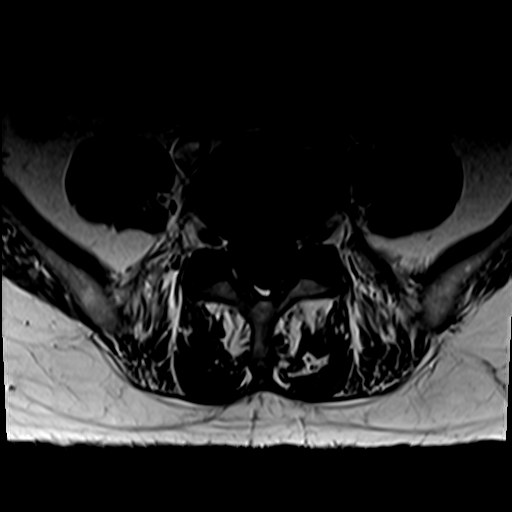
[im 12/40]
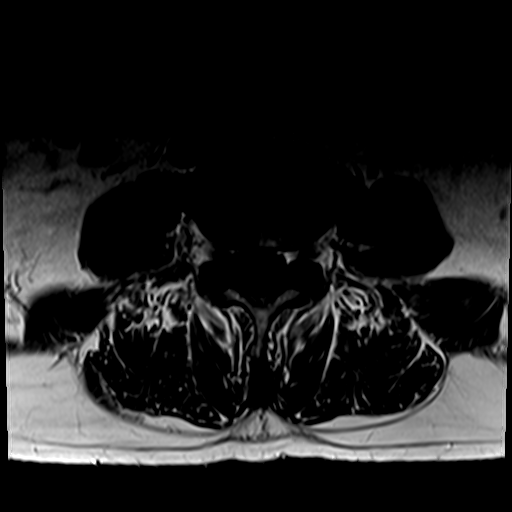
[im 17/40]
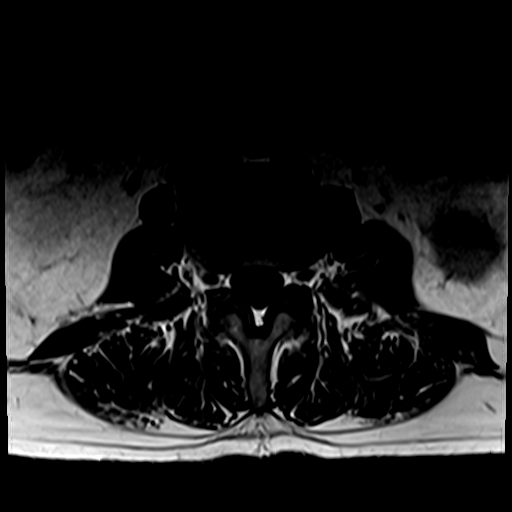
[im 20/40]
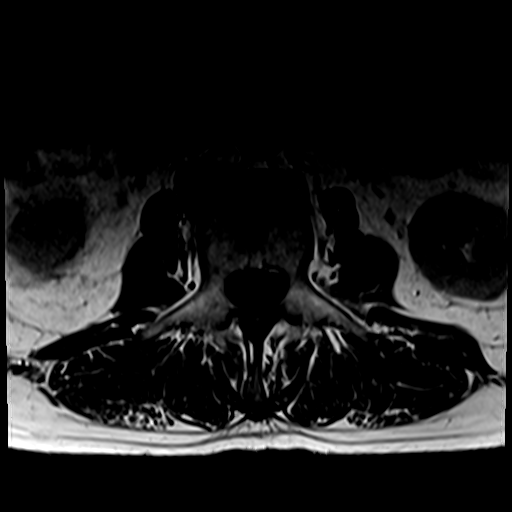
[im 23/40]
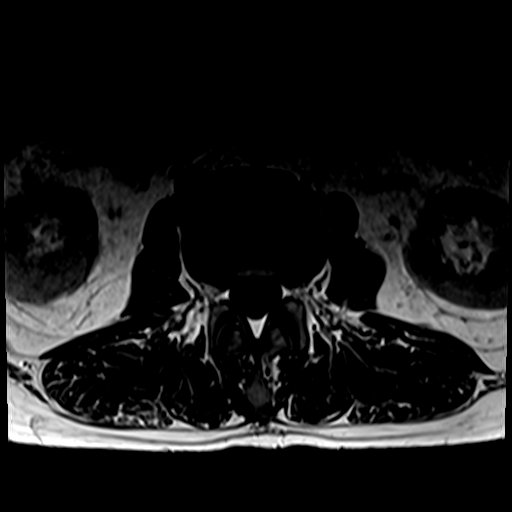
[im 28/40]
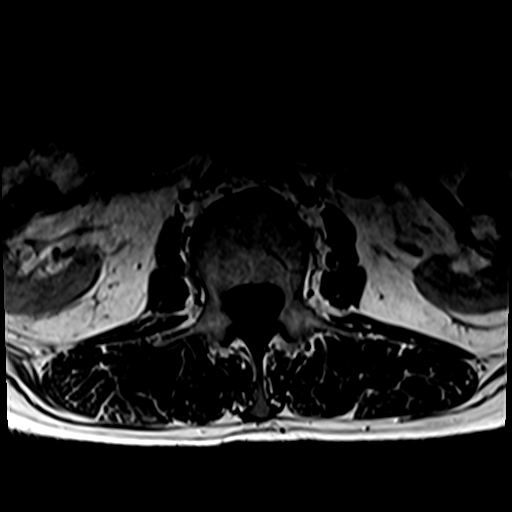
[im 34/40]
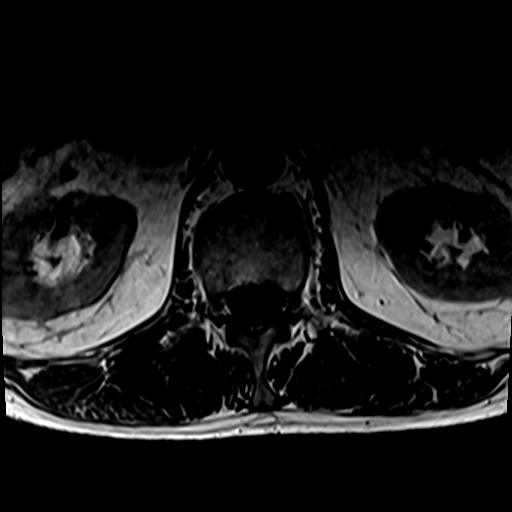
[im 40/40]
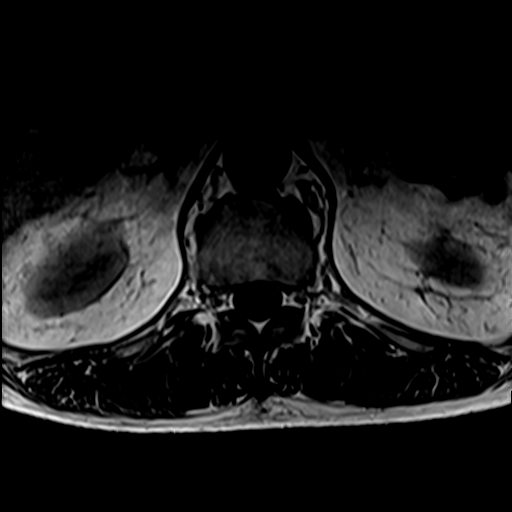

[31 of 48 positions shown; findings below may reference images not displayed]

FINDINGS: Segmentation: 5 lumbar type vertebral bodies as numbered previously.

Alignment:  2 mm retrolisthesis L2-3.

Vertebrae: Chronic superior endplate Schmorl's node at L3. Less
associated edema when compared to the study 1 year ago. Small
inferior endplate Schmorl's node at L4, similar in appearance,
without likely symptomatic edema.

Conus medullaris and cauda equina: Conus extends to the L1-2 level.
Conus and cauda equina appear normal.

Paraspinal and other soft tissues: Negative

Disc levels:

T12-L1 and L1-2: Normal

L2-3: 2 mm retrolisthesis. Mild desiccation and minimal bulging of
the disc. Superior endplate Schmorl's noted L3 as noted above with
mild edema, but less than was seen in the study September 2019. This
could relate to back pain. No sign of stenosis or neural compression
at this level.

L3-4: Minimal disc bulge.  No stenosis.

L4-5: Mild desiccation and bulging of the disc. Bilateral facet
osteoarthritis which could be a cause of back pain or referred facet
syndrome pain. No compressive narrowing of the canal or foramina.
Minimal Schmorl's node at the inferior endplate of L4, not likely
significant.

L5-S1: Mild annular bulging with annular fissure. No compressive
narrowing of the canal or foramina. No significant facet arthritis.
IMPRESSION: 1. L2-3: 2 mm retrolisthesis. Mild bulging of the disc. No
compressive narrowing of the canal or foramina. Superior endplate
Schmorl's node at L3, with less associated edema than was seen in
the study September 2019. This could possibly relate to back pain. No
compressive stenosis.
2. L4-5: Mild desiccation and bulging of the disc. Bilateral facet
osteoarthritis which could be a cause of back pain or referred facet
syndrome pain. No compressive narrowing of the canal or foramina.
Similar appearance to the study of last year.
3. L5-S1: Minimal annular bulging with annular fissure. No stenosis.
No significant facet arthritis. Similar appearance to the study of
last year.

## 2022-04-22 ENCOUNTER — Other Ambulatory Visit: Payer: Medicare HMO

## 2022-04-25 ENCOUNTER — Ambulatory Visit: Payer: Medicare HMO | Admitting: Urology

## 2022-04-27 ENCOUNTER — Ambulatory Visit: Payer: Medicare HMO | Admitting: Urology

## 2022-05-09 ENCOUNTER — Other Ambulatory Visit: Payer: Self-pay

## 2022-05-09 DIAGNOSIS — C61 Malignant neoplasm of prostate: Secondary | ICD-10-CM

## 2022-05-10 ENCOUNTER — Other Ambulatory Visit: Payer: Medicare HMO

## 2022-05-10 DIAGNOSIS — C61 Malignant neoplasm of prostate: Secondary | ICD-10-CM

## 2022-05-11 LAB — PSA: Prostate Specific Ag, Serum: 3.5 ng/mL (ref 0.0–4.0)

## 2022-05-12 ENCOUNTER — Ambulatory Visit: Payer: Medicare HMO | Admitting: Urology

## 2022-05-18 ENCOUNTER — Encounter: Payer: Self-pay | Admitting: Urology

## 2022-05-18 ENCOUNTER — Ambulatory Visit: Payer: Medicare HMO | Admitting: Urology

## 2022-05-18 VITALS — BP 156/81 | HR 59 | Ht 72.0 in | Wt 210.0 lb

## 2022-05-18 DIAGNOSIS — N401 Enlarged prostate with lower urinary tract symptoms: Secondary | ICD-10-CM

## 2022-05-18 DIAGNOSIS — N5201 Erectile dysfunction due to arterial insufficiency: Secondary | ICD-10-CM

## 2022-05-18 DIAGNOSIS — R8290 Unspecified abnormal findings in urine: Secondary | ICD-10-CM | POA: Diagnosis not present

## 2022-05-18 DIAGNOSIS — C61 Malignant neoplasm of prostate: Secondary | ICD-10-CM | POA: Diagnosis not present

## 2022-05-18 DIAGNOSIS — R3912 Poor urinary stream: Secondary | ICD-10-CM

## 2022-05-18 NOTE — Progress Notes (Signed)
05/18/2022 10:41 AM   Adam Glass 04-23-1956 [id number]  Referring provider: Rusty Aus, MD [place] [facility] [place],  [place] [address]  Chief Complaint  Patient presents with   Follow-up    Urologic history:  1. 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 -Biopsy positive Gleason 3+3 adenocarcinoma (5%) left mid core -Volume 59.9 cc -Confirmatory biopsy 05/2018; volume 60 cc -All cores negative for prostate cancer.  Focal acute, chronic and granulomatous inflammation   2.  Erectile dysfunction   HPI: 66 y.o. male presents for annual follow-up.  Doing well since last visit As noted decreased force and caliber of his urinary stream and postvoid dribbling Denies dysuria, gross hematuria Denies flank, abdominal or pelvic pain PSA 05/10/2022 stable at 3.5; 3.4 12/2021 He also meant to discuss last year malodorous urine.  No dysuria, UTI.  UA with PCP 12/2021 was negative though was concentrated at 1.025 specific gravity Tadalafil did work slightly better than sildenafil    PMH: Past Medical History:  Diagnosis Date   Arthritis    Atrial fibrillation ([place])    Change in bowel habits    Constipation    Depression    Dysrhythmia    A fib   Hypertension    Sleep apnea    CPAP    Surgical History: Past Surgical History:  Procedure Laterality Date   COLONOSCOPY     COLONOSCOPY WITH PROPOFOL N/A 10/20/2017   Procedure: COLONOSCOPY WITH PROPOFOL;  Surgeon: Manya Silvas, MD;  Location: [facility] ENDOSCOPY;  Service: Endoscopy;  Laterality: N/A;   KNEE ARTHROSCOPY Left    KNEE ARTHROSCOPY Left 11/09/2018   Procedure: ARTHROSCOPY KNEE LEFT;  Surgeon: Dereck Leep, MD;  Location: [facility] ORS;  Service: Orthopedics;  Laterality: Left;   ROTATOR CUFF REPAIR Left    VASECTOMY      Home Medications:  Allergies as  of 05/18/2022       Reactions   Shellfish Allergy Shortness Of Breath, Swelling   Throat/tongue swells        Medication List        Accurate as of May 18, 2022 10:41 AM. If you have any questions, ask your nurse or doctor.          STOP taking these medications    atenolol 100 MG tablet Commonly known as: TENORMIN Stopped by: Abbie Sons, MD       TAKE these medications    acetaminophen 650 MG CR tablet Commonly known as: TYLENOL Take 1,300 mg by mouth every 8 (eight) hours as needed for pain.   aspirin EC 81 MG tablet Take 81 mg by mouth daily.   bisoprolol 5 MG tablet Commonly known as: ZEBETA Take by mouth.   cetirizine 10 MG tablet Commonly known as: ZYRTEC Take 10 mg by mouth daily.   EPINEPHrine 0.3 mg/0.3 mL Soaj injection Commonly known as: EPI-PEN Inject 0.3 mg into the muscle as needed for anaphylaxis.   fluticasone 50 MCG/ACT nasal spray Commonly known as: FLONASE Place 1 spray into both nostrils daily as needed (allergies.).   hydrALAZINE 50 MG tablet Commonly known as: APRESOLINE Take 50 mg by mouth daily.   NON FORMULARY Place 3 drops under the tongue every evening. Sublingual Allergy Drops Immunotherapy   olmesartan 40 MG tablet Commonly known as: BENICAR Take 40 mg by mouth every evening.   olopatadine 0.1 %  ophthalmic solution Commonly known as: PATANOL Place 1 drop into both eyes daily as needed for allergies (dryness).   potassium chloride 10 MEQ CR capsule Commonly known as: MICRO-K Take 10 mEq by mouth daily.   sildenafil 20 MG tablet Commonly known as: REVATIO Take 40 mg by mouth daily as needed (erectile dysfunction.).   tadalafil 10 MG tablet Commonly known as: CIALIS 1-2 tabs 1 hour prior to intercourse.        Allergies:  Allergies  Allergen Reactions   Shellfish Allergy Shortness Of Breath and Swelling    Throat/tongue swells    Family History: Family History  Problem Relation Age of  Onset   Heart attack Father     Social History:  reports that he has never smoked. He has never used smokeless tobacco. He reports current alcohol use. He reports that he does not use drugs.   Physical Exam: BP (!) 156/81   Pulse (!) 59   Ht 6' (1.829 m)   Wt 210 lb (95.3 kg)   BMI 28.48 kg/m   Constitutional:  Alert and oriented, No acute distress. HEENT: [place] AT Respiratory: Normal respiratory effort, no increased work of breathing. GU: Prostate 50 g, smooth without nodules Psychiatric: Normal mood and affect.   Assessment & Plan:    1.  T1c adenocarcinoma prostate-low risk Stable PSA Benign DRE 2-monthinterim PSA by PCP 1 year follow-up with DRE  2.  Erectile dysfunction Stable  3.  Malodorous urine UA was negative We discussed potential etiologies including diet, medications.  Urine was concentrated and would initially suggest increasing hydration to see if odor improves  4.  BPH with LUTS Mild lower urinary tract symptoms Discussed medical management with tamsulosin and prostate supplements including saw palmetto He does not desire prescription medication at this time   SAbbie Sons MD  [id number] [place] [phone]

## 2022-06-29 DIAGNOSIS — Z0000 Encounter for general adult medical examination without abnormal findings: Secondary | ICD-10-CM | POA: Insufficient documentation

## 2023-01-27 ENCOUNTER — Encounter (HOSPITAL_COMMUNITY): Payer: Self-pay

## 2023-01-27 ENCOUNTER — Inpatient Hospital Stay (HOSPITAL_COMMUNITY)
Admission: EM | Admit: 2023-01-27 | Discharge: 2023-02-01 | DRG: 392 | Disposition: A | Discharge status: Home / Self Care | Payer: Medicare HMO | Attending: Internal Medicine | Admitting: Internal Medicine

## 2023-01-27 ENCOUNTER — Emergency Department (HOSPITAL_COMMUNITY): Payer: Medicare HMO

## 2023-01-27 ENCOUNTER — Other Ambulatory Visit: Payer: Self-pay

## 2023-01-27 DIAGNOSIS — M1990 Unspecified osteoarthritis, unspecified site: Secondary | ICD-10-CM | POA: Diagnosis present

## 2023-01-27 DIAGNOSIS — Z8249 Family history of ischemic heart disease and other diseases of the circulatory system: Secondary | ICD-10-CM | POA: Diagnosis not present

## 2023-01-27 DIAGNOSIS — K631 Perforation of intestine (nontraumatic): Secondary | ICD-10-CM | POA: Diagnosis present

## 2023-01-27 DIAGNOSIS — K5720 Diverticulitis of large intestine with perforation and abscess without bleeding: Principal | ICD-10-CM | POA: Diagnosis present

## 2023-01-27 DIAGNOSIS — M549 Dorsalgia, unspecified: Secondary | ICD-10-CM | POA: Diagnosis present

## 2023-01-27 DIAGNOSIS — I10 Essential (primary) hypertension: Secondary | ICD-10-CM | POA: Diagnosis present

## 2023-01-27 DIAGNOSIS — M47816 Spondylosis without myelopathy or radiculopathy, lumbar region: Secondary | ICD-10-CM | POA: Diagnosis present

## 2023-01-27 DIAGNOSIS — E876 Hypokalemia: Secondary | ICD-10-CM | POA: Diagnosis present

## 2023-01-27 DIAGNOSIS — I480 Paroxysmal atrial fibrillation: Secondary | ICD-10-CM | POA: Diagnosis present

## 2023-01-27 DIAGNOSIS — C61 Malignant neoplasm of prostate: Secondary | ICD-10-CM | POA: Diagnosis present

## 2023-01-27 DIAGNOSIS — Z91013 Allergy to seafood: Secondary | ICD-10-CM | POA: Diagnosis not present

## 2023-01-27 DIAGNOSIS — Z7982 Long term (current) use of aspirin: Secondary | ICD-10-CM

## 2023-01-27 DIAGNOSIS — Z79899 Other long term (current) drug therapy: Secondary | ICD-10-CM | POA: Diagnosis not present

## 2023-01-27 DIAGNOSIS — G4733 Obstructive sleep apnea (adult) (pediatric): Secondary | ICD-10-CM | POA: Diagnosis present

## 2023-01-27 DIAGNOSIS — K5792 Diverticulitis of intestine, part unspecified, without perforation or abscess without bleeding: Principal | ICD-10-CM | POA: Diagnosis present

## 2023-01-27 DIAGNOSIS — F32A Depression, unspecified: Secondary | ICD-10-CM | POA: Diagnosis present

## 2023-01-27 LAB — URINALYSIS, ROUTINE W REFLEX MICROSCOPIC
Bacteria, UA: NONE SEEN
Bilirubin Urine: NEGATIVE
Glucose, UA: NEGATIVE mg/dL
Ketones, ur: NEGATIVE mg/dL
Leukocytes,Ua: NEGATIVE
Nitrite: NEGATIVE
Protein, ur: NEGATIVE mg/dL
Specific Gravity, Urine: >1.046 — ABNORMAL HIGH (ref 1.005–1.030)
pH: 6 (ref 5.0–8.0)

## 2023-01-27 LAB — CBC WITH DIFFERENTIAL/PLATELET
Abs Immature Granulocytes: 0.04 10*3/uL (ref 0.00–0.07)
Basophils Absolute: 0 10*3/uL (ref 0.0–0.1)
Basophils Relative: 0 %
Eosinophils Absolute: 0.1 10*3/uL (ref 0.0–0.5)
Eosinophils Relative: 1 %
HCT: 42.4 % (ref 39.0–52.0)
Hemoglobin: 14.5 g/dL (ref 13.0–17.0)
Immature Granulocytes: 0 %
Lymphocytes Relative: 20 %
Lymphs Abs: 2.6 10*3/uL (ref 0.7–4.0)
MCH: 31.3 pg (ref 26.0–34.0)
MCHC: 34.2 g/dL (ref 30.0–36.0)
MCV: 91.6 fL (ref 80.0–100.0)
Monocytes Absolute: 1.2 10*3/uL — ABNORMAL HIGH (ref 0.1–1.0)
Monocytes Relative: 9 %
Neutro Abs: 9 10*3/uL — ABNORMAL HIGH (ref 1.7–7.7)
Neutrophils Relative %: 70 %
Platelets: 216 10*3/uL (ref 150–400)
RBC: 4.63 MIL/uL (ref 4.22–5.81)
RDW: 13 % (ref 11.5–15.5)
WBC: 12.9 10*3/uL — ABNORMAL HIGH (ref 4.0–10.5)
nRBC: 0 % (ref 0.0–0.2)

## 2023-01-27 LAB — COMPREHENSIVE METABOLIC PANEL
ALT: 24 U/L (ref 0–44)
AST: 20 U/L (ref 15–41)
Albumin: 3.9 g/dL (ref 3.5–5.0)
Alkaline Phosphatase: 83 U/L (ref 38–126)
Anion gap: 9 (ref 5–15)
BUN: 17 mg/dL (ref 8–23)
CO2: 25 mmol/L (ref 22–32)
Calcium: 8.8 mg/dL — ABNORMAL LOW (ref 8.9–10.3)
Chloride: 102 mmol/L (ref 98–111)
Creatinine, Ser: 0.99 mg/dL (ref 0.61–1.24)
GFR, Estimated: >60 mL/min (ref >60)
Glucose, Bld: 113 mg/dL — ABNORMAL HIGH (ref 70–99)
Potassium: 3.3 mmol/L — ABNORMAL LOW (ref 3.5–5.1)
Sodium: 136 mmol/L (ref 135–145)
Total Bilirubin: 1 mg/dL (ref 0.3–1.2)
Total Protein: 7 g/dL (ref 6.5–8.1)

## 2023-01-27 LAB — LIPASE, BLOOD: Lipase: 26 U/L (ref 11–51)

## 2023-01-27 LAB — PHOSPHORUS: Phosphorus: 2.9 mg/dL (ref 2.5–4.6)

## 2023-01-27 LAB — MAGNESIUM: Magnesium: 1.8 mg/dL (ref 1.7–2.4)

## 2023-01-27 MED ORDER — PIPERACILLIN-TAZOBACTAM 3.375 G IVPB 30 MIN
3.3750 g | Freq: Once | INTRAVENOUS | Status: DC
  Administered 2023-01-27: 3.375 g via INTRAVENOUS
  Filled 2023-01-27: qty 50

## 2023-01-27 MED ORDER — SODIUM CHLORIDE 0.9 % IV BOLUS
1000.0000 mL | Freq: Once | INTRAVENOUS | Status: AC
  Administered 2023-01-27: 1000 mL via INTRAVENOUS

## 2023-01-27 MED ORDER — ASPIRIN 81 MG PO TBEC
81.0000 mg | DELAYED_RELEASE_TABLET | Freq: Every day | ORAL | Status: DC
  Administered 2023-01-28 – 2023-02-01 (×5): 81 mg via ORAL
  Filled 2023-01-27 (×5): qty 1

## 2023-01-27 MED ORDER — BISOPROLOL FUMARATE 5 MG PO TABS
5.0000 mg | ORAL_TABLET | Freq: Every evening | ORAL | Status: DC
  Administered 2023-01-27 – 2023-01-31 (×5): 5 mg via ORAL
  Filled 2023-01-27 (×5): qty 1

## 2023-01-27 MED ORDER — POTASSIUM CHLORIDE IN NACL 20-0.9 MEQ/L-% IV SOLN
INTRAVENOUS | Status: AC
  Filled 2023-01-27: qty 1000

## 2023-01-27 MED ORDER — IOHEXOL 300 MG/ML  SOLN
100.0000 mL | Freq: Once | INTRAMUSCULAR | Status: AC | PRN
  Administered 2023-01-27: 100 mL via INTRAVENOUS

## 2023-01-27 MED ORDER — ACETAMINOPHEN 325 MG PO TABS
650.0000 mg | ORAL_TABLET | Freq: Four times a day (QID) | ORAL | Status: DC | PRN
  Administered 2023-01-27 – 2023-01-29 (×3): 650 mg via ORAL
  Filled 2023-01-27 (×3): qty 2

## 2023-01-27 MED ORDER — PANTOPRAZOLE SODIUM 40 MG IV SOLR
40.0000 mg | INTRAVENOUS | Status: DC
  Administered 2023-01-27 – 2023-01-31 (×5): 40 mg via INTRAVENOUS
  Filled 2023-01-27 (×5): qty 10

## 2023-01-27 MED ORDER — ENOXAPARIN SODIUM 40 MG/0.4ML IJ SOSY
40.0000 mg | PREFILLED_SYRINGE | INTRAMUSCULAR | Status: DC
  Administered 2023-01-27 – 2023-01-31 (×5): 40 mg via SUBCUTANEOUS
  Filled 2023-01-27 (×5): qty 0.4

## 2023-01-27 MED ORDER — PIPERACILLIN-TAZOBACTAM 3.375 G IVPB 30 MIN: 3.3750 g | Freq: Three times a day (TID) | INTRAVENOUS | Status: DC

## 2023-01-27 MED ORDER — PIPERACILLIN-TAZOBACTAM 3.375 G IVPB
3.3750 g | Freq: Three times a day (TID) | INTRAVENOUS | Status: DC
  Administered 2023-01-27 – 2023-02-01 (×14): 3.375 g via INTRAVENOUS
  Filled 2023-01-27 (×14): qty 50

## 2023-01-27 MED ORDER — HYDROMORPHONE HCL 1 MG/ML IJ SOLN
1.0000 mg | INTRAMUSCULAR | Status: DC | PRN
  Administered 2023-01-27 – 2023-01-28 (×6): 1 mg via INTRAVENOUS
  Filled 2023-01-27 (×6): qty 1

## 2023-01-27 MED ORDER — POTASSIUM CHLORIDE 10 MEQ/100ML IV SOLN
10.0000 meq | INTRAVENOUS | Status: AC
  Administered 2023-01-27 (×2): 10 meq via INTRAVENOUS
  Filled 2023-01-27 (×2): qty 100

## 2023-01-27 MED ORDER — HYDRALAZINE HCL 50 MG PO TABS
50.0000 mg | ORAL_TABLET | Freq: Two times a day (BID) | ORAL | Status: DC
  Administered 2023-01-27 – 2023-02-01 (×10): 50 mg via ORAL
  Filled 2023-01-27 (×10): qty 1

## 2023-01-27 MED ORDER — IRBESARTAN 150 MG PO TABS
300.0000 mg | ORAL_TABLET | Freq: Every day | ORAL | Status: DC
  Administered 2023-01-27 – 2023-01-31 (×5): 300 mg via ORAL
  Filled 2023-01-27 (×7): qty 2

## 2023-01-27 MED ORDER — ONDANSETRON HCL 4 MG/2ML IJ SOLN
4.0000 mg | Freq: Once | INTRAMUSCULAR | Status: AC
  Administered 2023-01-27: 4 mg via INTRAVENOUS
  Filled 2023-01-27: qty 2

## 2023-01-27 MED ORDER — SODIUM CHLORIDE (PF) 0.9 % IJ SOLN
INTRAMUSCULAR | Status: AC
  Filled 2023-01-27: qty 50

## 2023-01-27 MED ORDER — ONDANSETRON HCL 4 MG/2ML IJ SOLN
4.0000 mg | Freq: Four times a day (QID) | INTRAMUSCULAR | Status: DC | PRN
  Administered 2023-01-28 – 2023-01-31 (×3): 4 mg via INTRAVENOUS
  Filled 2023-01-27 (×3): qty 2

## 2023-01-27 MED ORDER — POTASSIUM CHLORIDE IN NACL 20-0.9 MEQ/L-% IV SOLN
INTRAVENOUS | Status: AC
  Filled 2023-01-27 (×2): qty 1000

## 2023-01-27 MED ORDER — ACETAMINOPHEN 650 MG RE SUPP: 650.0000 mg | Freq: Four times a day (QID) | RECTAL | Status: DC | PRN

## 2023-01-27 MED ORDER — ONDANSETRON HCL 4 MG PO TABS
4.0000 mg | ORAL_TABLET | Freq: Four times a day (QID) | ORAL | Status: DC | PRN
  Administered 2023-01-30: 4 mg via ORAL
  Filled 2023-01-27: qty 1

## 2023-01-27 MED ORDER — HYDROMORPHONE HCL 1 MG/ML IJ SOLN
1.0000 mg | Freq: Once | INTRAMUSCULAR | Status: AC
  Administered 2023-01-27: 1 mg via INTRAVENOUS
  Filled 2023-01-27: qty 1

## 2023-01-27 NOTE — H&P (Signed)
History and Physical    Patient: Adam Glass GMW:[id number] DOB: May 27, 1956 DOA: 01/27/2023 DOS: the patient was seen and examined on 01/27/2023 PCP: Danella Penton, MD  Patient coming from: Home  Chief Complaint:  Chief Complaint  Patient presents with   Abdominal Pain   HPI: Adam Glass is a 67 y.o. male with medical history significant of osteoarthritis, paroxysmal atrial fibrillation, prostate cancer, lumbar spondylosis, constipation, tubular adenoma, depression, hypertension, sleep apnea on CPAP who presented to the emergency department complaints of abdominal pain  since yesterday afternoon while he was sitting down watching TV and has gotten progressively worse since then.  He had a similar episode about 15 years ago that resolved with IV hydration, IV antibiotics and symptoms management.  No nausea, emesis, diarrhea, constipation, melena or hematochezia.  No flank pain, dysuria or hematuria, but feels like he has been going more often to urinate since yesterday.He denied fever, chills, rhinorrhea, sore throat, wheezing or hemoptysis.  No chest pain, palpitations, diaphoresis, PND, orthopnea or pitting edema of the lower extremities.   No polyuria, polydipsia, polyphagia or blurred vision.   Lab work: CBC showed a white count of 12.9 with 70% neutrophils, hemoglobin 14.5 g/dL and platelets 440.  Lipase was 26 units/L.  Potassium is 3.3 mmol/L, glucose on 113 and calcium 8.8 mg/deciliter.  Calcium level is 8.9 mg/dL after correction to albumin level.  Imaging: CT abdomen/pelvis with contrast showing acute sigmoid diverticulitis with single locule of extraluminal gas along the anterior aspect of the colon, concerning for early perforation.  No drainable abscess.  Prostatomegaly.  Aortic atherosclerosis.  CT L-spine with no acute fracture or traumatic listhesis of the lumbar spine.  Diverticulosis with focal wall thickening in the sigmoid colon suspicious for diverticulitis.   ED  course: Initial vital signs were temperature 98.3 F, pulse 65, respiration 18, BP 145/90 mmHg O2 sat 100% on room air.  The patient received 1000 mL of normal saline bolus, hydromorphone 1 mg IVP, ondansetron 4 mg IVP and Zosyn 3.375 g IVPB.  Review of Systems: As mentioned in the history of present illness. All other systems reviewed and are negative. Past Medical History:  Diagnosis Date   Arthritis    Atrial fibrillation ([facility])    Change in bowel habits    Constipation    Depression    Dysrhythmia    A fib   Hypertension    Sleep apnea    CPAP   Past Surgical History:  Procedure Laterality Date   COLONOSCOPY     COLONOSCOPY WITH PROPOFOL N/A 10/20/2017   Procedure: COLONOSCOPY WITH PROPOFOL;  Surgeon: Scot Jun, MD;  Location: [facility] ENDOSCOPY;  Service: Endoscopy;  Laterality: N/A;   KNEE ARTHROSCOPY Left    KNEE ARTHROSCOPY Left 11/09/2018   Procedure: ARTHROSCOPY KNEE LEFT;  Surgeon: Donato Heinz, MD;  Location: [facility] ORS;  Service: Orthopedics;  Laterality: Left;   ROTATOR CUFF REPAIR Left    VASECTOMY     Social History:  reports that he has never smoked. He has never used smokeless tobacco. He reports current alcohol use. He reports that he does not use drugs.  Allergies  Allergen Reactions   Shellfish Allergy Shortness Of Breath and Swelling    Throat/tongue swells    Family History  Problem Relation Age of Onset   Heart attack Father     Prior to Admission medications   Medication Sig Start Date End Date Taking? Authorizing Provider  acetaminophen (TYLENOL) 650 MG CR tablet  Take 1,300 mg by mouth every 8 (eight) hours as needed for pain.    [provider]  aspirin EC 81 MG tablet Take 81 mg by mouth daily.    [provider]  bisoprolol (ZEBETA) 5 MG tablet Take by mouth. 12/27/21   [provider]  cetirizine (ZYRTEC) 10 MG tablet Take 10 mg by mouth daily.    [provider]  EPINEPHrine 0.3 mg/0.3 mL IJ SOAJ  injection Inject 0.3 mg into the muscle as needed for anaphylaxis.     [provider]  fluticasone (FLONASE) 50 MCG/ACT nasal spray Place 1 spray into both nostrils daily as needed (allergies.).     [provider]  hydrALAZINE (APRESOLINE) 50 MG tablet Take 50 mg by mouth daily.    [provider]  NON FORMULARY Place 3 drops under the tongue every evening. Sublingual Allergy Drops Immunotherapy    [provider]  olmesartan (BENICAR) 40 MG tablet Take 40 mg by mouth every evening.     [provider]  olopatadine (PATANOL) 0.1 % ophthalmic solution Place 1 drop into both eyes daily as needed for allergies (dryness).    [provider]  potassium chloride (MICRO-K) 10 MEQ CR capsule Take 10 mEq by mouth daily.  02/16/18   [provider]  sildenafil (REVATIO) 20 MG tablet Take 40 mg by mouth daily as needed (erectile dysfunction.).  03/09/17   [provider]  tadalafil (CIALIS) 10 MG tablet 1-2 tabs 1 hour prior to intercourse. 06/22/21   Riki Altes, MD    Physical Exam: Vitals:   01/27/23 0932 01/27/23 0939  BP: (!) 145/90   Pulse: 65   Resp: 18   Temp: 98.3 F (36.8 C)   TempSrc: Oral   SpO2: 100%   Weight:  92.5 kg  Height:  6\' 4"  (1.93 m)   Physical Exam Vitals and nursing note reviewed.  Constitutional:      General: He is awake. He is not in acute distress.    Appearance: He is well-developed and normal weight.  HENT:     Head: Normocephalic.     Nose: No rhinorrhea.     Mouth/Throat:     Mouth: Mucous membranes are moist.  Eyes:     General: No scleral icterus.    Pupils: Pupils are equal, round, and reactive to light.  Neck:     Vascular: No JVD.  Cardiovascular:     Rate and Rhythm: Normal rate and regular rhythm.     Heart sounds: S1 normal and S2 normal.  Pulmonary:     Effort: Pulmonary effort is normal.     Breath sounds: Normal breath sounds. No wheezing, rhonchi or rales.   Abdominal:     General: Bowel sounds are normal.     Palpations: Abdomen is soft.     Tenderness: There is abdominal tenderness in the left lower quadrant. There is no right CVA tenderness, left CVA tenderness, guarding or rebound.  Musculoskeletal:     Cervical back: Neck supple.     Right lower leg: No edema.     Left lower leg: No edema.  Skin:    General: Skin is warm and dry.  Neurological:     General: No focal deficit present.     Mental Status: He is alert and oriented to person, place, and time.  Psychiatric:        Mood and Affect: Mood normal.        Behavior:  Behavior normal. Behavior is cooperative.    Data Reviewed:  Results are pending, will review when available.  Assessment and Plan: Principal Problem:   Acute diverticulitis Inpatient/MedSurg. Continue IV fluids. Keep n.p.o. for now. Analgesics as needed. Antiemetics as needed. Pantoprazole 40 mg IVP daily. Continue Zosyn 3.375 g IVPB every 8 hours. Follow CBC, CMP  in AM. General surgery consult appreciated.  Active Problems:   Benign essential hypertension Continue bisoprolol 5 mg p.o. daily. Continue hydralazine 50 mg p.o. twice daily. Continue olmesartan 40 mg p.o. daily or formulary equivalent.    OSA on CPAP Continue CPAP at bedtime.    Hypokalemia Replacing. Follow-up potassium level in AM.    Advance Care Planning:   Code Status: Full Code   Consults: [facility].  Family Communication:   Severity of Illness: The appropriate patient status for this patient is INPATIENT. Inpatient status is judged to be reasonable and necessary in order to provide the required intensity of service to ensure the patient's safety. The patient's presenting symptoms, physical exam findings, and initial radiographic and laboratory data in the context of their chronic comorbidities is felt to place them at high risk for further clinical deterioration. Furthermore, it is not anticipated that the  patient will be medically stable for discharge from the hospital within 2 midnights of admission.   * I certify that at the point of admission it is my clinical judgment that the patient will require inpatient hospital care spanning beyond 2 midnights from the point of admission due to high intensity of service, high risk for further deterioration and high frequency of surveillance required.*  Author: Bobette Mo, MD 01/27/2023 12:04 PM  For on call review [email or web address].   This document was prepared using Dragon voice recognition software and may contain some unintended transcription errors.

## 2023-01-27 NOTE — Progress Notes (Signed)
   01/27/23 2200  BiPAP/CPAP/SIPAP  BiPAP/CPAP/SIPAP Pt Type Adult  BiPAP/CPAP/SIPAP Resmed  FiO2 (%) 21 %  Patient Home Equipment Yes

## 2023-01-27 NOTE — ED Provider Notes (Signed)
[facility] Provider Note   CSN: [id number] Arrival date & time: 01/27/23  4098     History  Chief Complaint  Patient presents with   Abdominal Pain   HPI Adam Glass is a 67 y.o. male with history of hypertension, A-fib presenting for abdominal pain. States the abdominal pain began last night about an hour after dinner.  Started around the umbilicus nonradiating to the left lower abdomen and left groin.  Feels like a sharp pain.  Denies associated nausea vomiting or diarrhea.  Had a normal bowel movement this morning.  Does endorse increased urinary frequency but no other urinary symptoms.  Mention that he had a spinal ablation in his lower back on Tuesday.  Also mentioned remote history of diverticulitis.  Denies fever.  Denies saddle anesthesia or any lower extremity numbness or weakness, urinary or bowel incontinence or retention.   Abdominal Pain      Home Medications Prior to Admission medications   Medication Sig Start Date End Date Taking? Authorizing Provider  acetaminophen (TYLENOL) 650 MG CR tablet Take 1,300 mg by mouth every 8 (eight) hours as needed for pain.    [provider]  aspirin EC 81 MG tablet Take 81 mg by mouth daily.    [provider]  bisoprolol (ZEBETA) 5 MG tablet Take by mouth. 12/27/21   [provider]  cetirizine (ZYRTEC) 10 MG tablet Take 10 mg by mouth daily.    [provider]  EPINEPHrine 0.3 mg/0.3 mL IJ SOAJ injection Inject 0.3 mg into the muscle as needed for anaphylaxis.     [provider]  fluticasone (FLONASE) 50 MCG/ACT nasal spray Place 1 spray into both nostrils daily as needed (allergies.).     [provider]  hydrALAZINE (APRESOLINE) 50 MG tablet Take 50 mg by mouth daily.    [provider]  NON FORMULARY Place 3 drops under the tongue every evening. Sublingual Allergy Drops Immunotherapy    [provider]   olmesartan (BENICAR) 40 MG tablet Take 40 mg by mouth every evening.     [provider]  olopatadine (PATANOL) 0.1 % ophthalmic solution Place 1 drop into both eyes daily as needed for allergies (dryness).    [provider]  potassium chloride (MICRO-K) 10 MEQ CR capsule Take 10 mEq by mouth daily.  02/16/18   [provider]  sildenafil (REVATIO) 20 MG tablet Take 40 mg by mouth daily as needed (erectile dysfunction.).  03/09/17   [provider]  tadalafil (CIALIS) 10 MG tablet 1-2 tabs 1 hour prior to intercourse. 06/22/21   Riki Altes, MD      Allergies    Shellfish allergy    Review of Systems   Review of Systems  Gastrointestinal:  Positive for abdominal pain.    Physical Exam Updated Vital Signs BP (!) 145/90 (BP Location: Right Arm)   Pulse 65   Temp 98.3 F (36.8 C) (Oral)   Resp 18   Ht 6\' 4"  (1.93 m)   Wt 92.5 kg   SpO2 100%   BMI 24.83 kg/m  Physical Exam Vitals and nursing note reviewed.  HENT:     Head: Normocephalic and atraumatic.     Mouth/Throat:     Mouth: Mucous membranes are moist.  Eyes:     General:        Right eye: No discharge.        Left eye: No discharge.  Conjunctiva/sclera: Conjunctivae normal.  Cardiovascular:     Rate and Rhythm: Normal rate and regular rhythm.     Pulses: Normal pulses.     Heart sounds: Normal heart sounds.  Pulmonary:     Effort: Pulmonary effort is normal.     Breath sounds: Normal breath sounds.  Abdominal:     General: Abdomen is flat.     Palpations: Abdomen is soft.     Tenderness: There is abdominal tenderness in the periumbilical area and left lower quadrant. There is no left CVA tenderness.  Skin:    General: Skin is warm and dry.  Neurological:     General: No focal deficit present.  Psychiatric:        Mood and Affect: Mood normal.     ED Results / Procedures / Treatments   Labs (all labs ordered are listed, but only abnormal results are  displayed) Labs Reviewed  CBC WITH DIFFERENTIAL/PLATELET - Abnormal; Notable for the following components:      Result Value   WBC 12.9 (*)    Neutro Abs 9.0 (*)    Monocytes Absolute 1.2 (*)    All other components within normal limits  COMPREHENSIVE METABOLIC PANEL - Abnormal; Notable for the following components:   Potassium 3.3 (*)    Glucose, Bld 113 (*)    Calcium 8.8 (*)    All other components within normal limits  LIPASE, BLOOD  URINALYSIS, ROUTINE W REFLEX MICROSCOPIC    EKG None  Radiology CT ABDOMEN PELVIS W CONTRAST  Addendum Date: 01/27/2023   ADDENDUM REPORT: 01/27/2023 11:49 ADDENDUM: Critical Value/emergent results were called by telephone at the time of interpretation on 01/27/2023 at 11:47 am to provider Riki Sheer , who verbally acknowledged these results. Electronically Signed   By: Orvan Falconer M.D.   On: 01/27/2023 11:49   Result Date: 01/27/2023 CLINICAL DATA:  Lower abdominal pain.  History of diverticulitis. EXAM: CT ABDOMEN AND PELVIS WITH CONTRAST TECHNIQUE: Multidetector CT imaging of the abdomen and pelvis was performed using the standard protocol following bolus administration of intravenous contrast. RADIATION DOSE REDUCTION: This exam was performed according to the departmental dose-optimization program which includes automated exposure control, adjustment of the mA and/or kV according to patient size and/or use of iterative reconstruction technique. CONTRAST:  OMNIPAQUE IOHEXOL 300 MG/ML  SOLN COMPARISON:  CT abdomen/pelvis 02/05/2010. FINDINGS: Lower chest: No acute findings. Hepatobiliary: No focal liver abnormality is seen. No gallstones, gallbladder wall thickening, or biliary dilatation. Pancreas: Unremarkable. No pancreatic ductal dilatation or surrounding inflammatory changes. Spleen: Normal in size without focal abnormality. Adrenals/Urinary Tract: Adrenal glands are unremarkable. Kidneys are normal, without renal calculi, focal lesion, or  hydronephrosis. Bladder is unremarkable. Stomach/Bowel: Normal stomach and duodenum. No dilated loops of small bowel. Normal appendix is visualized on axial image 51 series 2. Descending and sigmoid diverticulosis. Focal wall thickening and surrounding inflammation of the proximal sigmoid colon, consistent with acute diverticulitis. Single locule of extraluminal gas along the anterior aspect of the colon (axial image 76 series 2), concerning for early perforation. Vascular/Lymphatic: Aortic atherosclerosis. No enlarged abdominal or pelvic lymph nodes. Reproductive: Prostatomegaly. Other: No abdominal wall hernia or abnormality. No abdominopelvic ascites. Musculoskeletal: No acute or significant osseous findings. IMPRESSION: 1. Acute sigmoid diverticulitis with single locule of extraluminal gas along the anterior aspect of the colon, concerning for early perforation. No drainable abscess. 2. Prostatomegaly. Aortic Atherosclerosis (ICD10-I70.0). Radiology assistant personnel have been notified to put me in telephone contact with the referring physician or  the referring physician's clinical representative in order to discuss these findings. Once this communication is established I will issue an addendum to this report for documentation purposes. Electronically Signed: By: Orvan Falconer M.D. On: 01/27/2023 11:42   CT L-SPINE NO CHARGE  Result Date: 01/27/2023 CLINICAL DATA:  Abdominal pain EXAM: CT LUMBAR SPINE WITHOUT CONTRAST TECHNIQUE: Multidetector CT imaging of the lumbar spine was performed without intravenous contrast administration. Multiplanar CT image reconstructions were also generated. RADIATION DOSE REDUCTION: This exam was performed according to the departmental dose-optimization program which includes automated exposure control, adjustment of the mA and/or kV according to patient size and/or use of iterative reconstruction technique. COMPARISON:  None Available. FINDINGS: Segmentation: 5 lumbar type  vertebrae. Alignment: Normal. Vertebrae: No acute fracture or focal pathologic process. Paraspinal and other soft tissues: There is diverticulosis with focal wall thickening in the sigmoid colon (series 2, image 141). Findings can be seen in the setting of diverticulitis. See same day CT abdomen and pelvis for additional findings. Disc levels: Mild multilevel degenerative changes. No evidence of high-grade spinal canal narrowing. There is mild bilateral neural foraminal narrowing at L4-L5. IMPRESSION: 1. No acute fracture or traumatic listhesis of the lumbar spine. 2. Diverticulosis with focal wall thickening in the sigmoid colon. Findings can be seen in the setting of diverticulitis. See same day CT abdomen and pelvis for additional findings. Electronically Signed   By: Lorenza Cambridge M.D.   On: 01/27/2023 11:35    Procedures Procedures    Medications Ordered in ED Medications  piperacillin-tazobactam (ZOSYN) IVPB 3.375 g (3.375 g Intravenous New Bag/Given 01/27/23 1211)  sodium chloride 0.9 % bolus 1,000 mL (0 mLs Intravenous Stopped 01/27/23 1215)  ondansetron (ZOFRAN) injection 4 mg (4 mg Intravenous Given 01/27/23 1010)  HYDROmorphone (DILAUDID) injection 1 mg (1 mg Intravenous Given 01/27/23 1010)  iohexol (OMNIPAQUE) 300 MG/ML solution 100 mL (100 mLs Intravenous Contrast Given 01/27/23 1043)    ED Course/ Medical Decision Making/ A&P Clinical Course as of 01/27/23 1217  Fri Jan 27, 2023  1146 Diverticulitis, concern for bowel perforation [JR]    Clinical Course User Index [JR] Gareth Eagle, PA-C                                 Medical Decision Making Amount and/or Complexity of Data Reviewed Labs: ordered. Radiology: ordered.  Risk Prescription drug management. Decision regarding hospitalization.   Initial Impression and Ddx 67 year old well-appearing male presenting for abdominal pain. Exam notable for umbilical and left lower quadrant tenderness. DDx includes  diverticulitis, appendicitis, obstruction or perforation, post spinal ablation complication.  Patient PMH that increases complexity of ED encounter:  hypertension, A-fib, recent spinal ablation  Interpretation of Diagnostics I independent reviewed and interpreted the labs as followed: Leukocytosis, hypokalemia  - I independently visualized the following imaging with scope of interpretation limited to determining acute life threatening conditions related to emergency care: CT abdomen pelvis, which revealed acute diverticulitis with evidence of possible early perforation but no abscess  Patient Reassessment and Ultimate Disposition/Management Workup suggestive of acute diverticulitis with early perforation.  Made patient n.p.o., started on Zosyn and treated with fluids.  Pain was well-controlled with Dilaudid.  Discussed patient with general surgery, Unknown Jim, PA.  She advised admission to the hospital and IV antibiotics and that surgery would see him in consultation.  Admitted to hospital service with Dr. Sanda Klein.  Patient management required discussion with the following  services or consulting groups:  Hospitalist Service and General/Trauma Surgery  Complexity of Problems Addressed Acute complicated illness or Injury  Additional Data Reviewed and Analyzed Further history obtained from: Further history from spouse/family member, Past medical history and medications listed in the EMR, Prior ED visit notes, and Recent discharge summary  Patient Encounter Risk Assessment Consideration of hospitalization         Final Clinical Impression(s) / ED Diagnoses Final diagnoses:  Acute diverticulitis  Bowel perforation [facility])    Rx / DC Orders ED Discharge Orders     None         Gareth Eagle, PA-C 01/27/23 1219    Linwood Dibbles, MD 01/28/23 [id number]

## 2023-01-27 NOTE — Consult Note (Signed)
Adam Glass 03-23-1956  [id number].    Requesting MD: Dr. Linwood Dibbles Chief Complaint/Reason for Consult: diverticulitis with extraluminal gas bubble  HPI:  This is a 67 yo white male with a history of OSA, a. Fib (not on anticoagulation), HTN, prostate cancer, and arthritis who has had one episode of diverticulitis at least 15 years ago that did require admission, but resolved with conservative management.  He has had no issues since that time until last night when he was sitting watching TV and developed an abrupt onset of LLQ abdominal pain.  This has been progressively worsening since last night.  He denies any N/V/fevers/diarrhea/blood in his stool, etc.  His last colonoscopy was in 2019 with multiple polyps and evidence of diverticulosis, but no other significant findings.    He presented to the [facility] today for evaluation of this pain.  He was noted to have a mild elevation in his WBC at 12.9, AF, and a CT scan showing diverticulitis with 1 locule of extraluminal air.  We have been asked to see for further evaluation.  ROS: ROS: see HPI  Family History  Problem Relation Age of Onset   Heart attack Father     Past Medical History:  Diagnosis Date   Arthritis    Atrial fibrillation (HCC)    Change in bowel habits    Constipation    Depression    Dysrhythmia    A fib   Hypertension    Sleep apnea    CPAP    Past Surgical History:  Procedure Laterality Date   COLONOSCOPY     COLONOSCOPY WITH PROPOFOL N/A 10/20/2017   Procedure: COLONOSCOPY WITH PROPOFOL;  Surgeon: Scot Jun, MD;  Location: [facility] ENDOSCOPY;  Service: Endoscopy;  Laterality: N/A;   KNEE ARTHROSCOPY Left    KNEE ARTHROSCOPY Left 11/09/2018   Procedure: ARTHROSCOPY KNEE LEFT;  Surgeon: Donato Heinz, MD;  Location: [facility] ORS;  Service: Orthopedics;  Laterality: Left;   ROTATOR CUFF REPAIR Left    VASECTOMY      Social History:  reports that he has never smoked. He has never used smokeless  tobacco. He reports current alcohol use. He reports that he does not use drugs.  Allergies:  Allergies  Allergen Reactions   Shellfish Allergy Shortness Of Breath and Swelling    Throat/tongue swells    (Not in a hospital admission)    Physical Exam: Blood pressure (!) 145/90, pulse 65, temperature 98.3 F (36.8 C), temperature source Oral, resp. rate 18, height 6\' 4"  (1.93 m), weight 92.5 kg, SpO2 100%. General: pleasant, WD, WN white male who is laying in bed in NAD HEENT: head is normocephalic, atraumatic.  Sclera are noninjected.  PERRL.  Ears and nose without any masses or lesions.  Mouth is pink and moist Heart: regular, rate, and rhythm.  Normal s1,s2. No obvious murmurs, gallops, or rubs noted.  Palpable radial and pedal pulses bilaterally Lungs: CTAB, no wheezes, rhonchi, or rales noted.  Respiratory effort nonlabored Abd: soft, tender in suprapubic and LLQ, but no guarding, rebounding, or peritonitis, ND, +BS, no masses, hernias, or organomegaly Psych: A&Ox3 with an appropriate affect.   Results for orders placed or performed during the hospital encounter of 01/27/23 (from the past 48 hour(s))  CBC with Differential     Status: Abnormal   Collection Time: 01/27/23  9:56 AM  Result Value Ref Range   WBC 12.9 (H) 4.0 - 10.5 K/uL   RBC 4.63 4.22 -  5.81 MIL/uL   Hemoglobin 14.5 13.0 - 17.0 g/dL   HCT 16.1 09.6 - 04.5 %   MCV 91.6 80.0 - 100.0 fL   MCH 31.3 26.0 - 34.0 pg   MCHC 34.2 30.0 - 36.0 g/dL   RDW 40.9 81.1 - 91.4 %   Platelets 216 150 - 400 K/uL   nRBC 0.0 0.0 - 0.2 %   Neutrophils Relative % 70 %   Neutro Abs 9.0 (H) 1.7 - 7.7 K/uL   Lymphocytes Relative 20 %   Lymphs Abs 2.6 0.7 - 4.0 K/uL   Monocytes Relative 9 %   Monocytes Absolute 1.2 (H) 0.1 - 1.0 K/uL   Eosinophils Relative 1 %   Eosinophils Absolute 0.1 0.0 - 0.5 K/uL   Basophils Relative 0 %   Basophils Absolute 0.0 0.0 - 0.1 K/uL   Immature Granulocytes 0 %   Abs Immature Granulocytes 0.04  0.00 - 0.07 K/uL    Comment: Performed at [facility], [address] [place], [place] [address]  Comprehensive metabolic panel     Status: Abnormal   Collection Time: 01/27/23  9:56 AM  Result Value Ref Range   Sodium 136 135 - 145 mmol/L   Potassium 3.3 (L) 3.5 - 5.1 mmol/L   Chloride 102 98 - 111 mmol/L   CO2 25 22 - 32 mmol/L   Glucose, Bld 113 (H) 70 - 99 mg/dL    Comment: Glucose reference range applies only to samples taken after fasting for at least 8 hours.   BUN 17 8 - 23 mg/dL   Creatinine, Ser 6.21 0.61 - 1.24 mg/dL   Calcium 8.8 (L) 8.9 - 10.3 mg/dL   Total Protein 7.0 6.5 - 8.1 g/dL   Albumin 3.9 3.5 - 5.0 g/dL   AST 20 15 - 41 U/L   ALT 24 0 - 44 U/L   Alkaline Phosphatase 83 38 - 126 U/L   Total Bilirubin 1.0 0.3 - 1.2 mg/dL   GFR, Estimated >30 >86 mL/min    Comment: (NOTE) Calculated using the CKD-EPI Creatinine Equation (2021)    Anion gap 9 5 - 15    Comment: Performed at [facility], [address], [place], [place] [address]  Lipase, blood     Status: None   Collection Time: 01/27/23  9:56 AM  Result Value Ref Range   Lipase 26 11 - 51 U/L    Comment: Performed at [facility], [address] [place], [place] [address]   CT ABDOMEN PELVIS W CONTRAST  Addendum Date: 01/27/2023   ADDENDUM REPORT: 01/27/2023 11:49 ADDENDUM: Critical Value/emergent results were called by telephone at the time of interpretation on 01/27/2023 at 11:47 am to provider Riki Sheer , who verbally acknowledged these results. Electronically Signed   By: Orvan Falconer M.D.   On: 01/27/2023 11:49   Result Date: 01/27/2023 CLINICAL DATA:  Lower abdominal pain.  History of diverticulitis. EXAM: CT ABDOMEN AND PELVIS WITH CONTRAST TECHNIQUE: Multidetector CT imaging of the abdomen and pelvis was performed using the standard protocol following bolus administration of intravenous contrast. RADIATION DOSE REDUCTION: This exam was  performed according to the departmental dose-optimization program which includes automated exposure control, adjustment of the mA and/or kV according to patient size and/or use of iterative reconstruction technique. CONTRAST:  OMNIPAQUE IOHEXOL 300 MG/ML  SOLN COMPARISON:  CT abdomen/pelvis 02/05/2010. FINDINGS: Lower chest: No acute findings. Hepatobiliary: No focal liver abnormality is seen. No gallstones, gallbladder wall  thickening, or biliary dilatation. Pancreas: Unremarkable. No pancreatic ductal dilatation or surrounding inflammatory changes. Spleen: Normal in size without focal abnormality. Adrenals/Urinary Tract: Adrenal glands are unremarkable. Kidneys are normal, without renal calculi, focal lesion, or hydronephrosis. Bladder is unremarkable. Stomach/Bowel: Normal stomach and duodenum. No dilated loops of small bowel. Normal appendix is visualized on axial image 51 series 2. Descending and sigmoid diverticulosis. Focal wall thickening and surrounding inflammation of the proximal sigmoid colon, consistent with acute diverticulitis. Single locule of extraluminal gas along the anterior aspect of the colon (axial image 76 series 2), concerning for early perforation. Vascular/Lymphatic: Aortic atherosclerosis. No enlarged abdominal or pelvic lymph nodes. Reproductive: Prostatomegaly. Other: No abdominal wall hernia or abnormality. No abdominopelvic ascites. Musculoskeletal: No acute or significant osseous findings. IMPRESSION: 1. Acute sigmoid diverticulitis with single locule of extraluminal gas along the anterior aspect of the colon, concerning for early perforation. No drainable abscess. 2. Prostatomegaly. Aortic Atherosclerosis (ICD10-I70.0). Radiology assistant personnel have been notified to put me in telephone contact with the referring physician or the referring physician's clinical representative in order to discuss these findings. Once this communication is established I will issue an addendum  to this report for documentation purposes. Electronically Signed: By: Orvan Falconer M.D. On: 01/27/2023 11:42   CT L-SPINE NO CHARGE  Result Date: 01/27/2023 CLINICAL DATA:  Abdominal pain EXAM: CT LUMBAR SPINE WITHOUT CONTRAST TECHNIQUE: Multidetector CT imaging of the lumbar spine was performed without intravenous contrast administration. Multiplanar CT image reconstructions were also generated. RADIATION DOSE REDUCTION: This exam was performed according to the departmental dose-optimization program which includes automated exposure control, adjustment of the mA and/or kV according to patient size and/or use of iterative reconstruction technique. COMPARISON:  None Available. FINDINGS: Segmentation: 5 lumbar type vertebrae. Alignment: Normal. Vertebrae: No acute fracture or focal pathologic process. Paraspinal and other soft tissues: There is diverticulosis with focal wall thickening in the sigmoid colon (series 2, image 141). Findings can be seen in the setting of diverticulitis. See same day CT abdomen and pelvis for additional findings. Disc levels: Mild multilevel degenerative changes. No evidence of high-grade spinal canal narrowing. There is mild bilateral neural foraminal narrowing at L4-L5. IMPRESSION: 1. No acute fracture or traumatic listhesis of the lumbar spine. 2. Diverticulosis with focal wall thickening in the sigmoid colon. Findings can be seen in the setting of diverticulitis. See same day CT abdomen and pelvis for additional findings. Electronically Signed   By: Lorenza Cambridge M.D.   On: 01/27/2023 11:35      Assessment/Plan Diverticulitis with microperforation The patient has been seen, examined, labs, vitals, chart, and imaging personally reviewed.  He does have diverticulitis with one locule of extraluminal air.  He is hemodynamically stable and does not require emergent surgical intervention at this time.  We will treat with conservative management of bowel rest and IV abx therapy.   I discussed this plan with the patient and the process for if he worsens such as repeat imaging, perc drain if he has an abscess, or last resort Hartmann's procedure.  He understands.  He will likely require a new colonoscopy when this resolves in 6-8 weeks given his last one was 5 years ago.  We will follow with you at this time.   FEN - NPO x sips and chips/IVFs VTE - Lovenox ID - zosyn  A fib - not on anticoagulation HTN OSA Arthritis  Prostate cancer  I reviewed ED provider notes, hospitalist notes, last 24 h vitals and pain scores, last 48  h intake and output, last 24 h labs and trends, and last 24 h imaging results.  Letha Cape, [facility] Surgery 01/27/2023, 12:11 PM Please see Amion for pager number during day hours 7:00am-4:30pm or 7:00am -11:30am on weekends

## 2023-01-27 NOTE — ED Notes (Signed)
ED TO INPATIENT HANDOFF REPORT  Name/Age/Gender Adam Glass 67 y.o. male  Code Status    Code Status Orders  (From admission, onward)           Start     Ordered   01/27/23 1221  Full code  Continuous       Question:  By:  Answer:  Consent: discussion documented in EHR   01/27/23 1222           Code Status History     Date Active Date Inactive Code Status Order ID Comments User Context   11/09/2018 1011 11/09/2018 1402 Full Code [id number]  Donato Heinz, MD Inpatient      Advance Directive Documentation    Flowsheet Row Most Recent Value  Type of Advance Directive Living will  Pre-existing out of facility DNR order (yellow form or pink MOST form) --  "MOST" Form in Place? --       Home/SNF/Other Home  Chief Complaint Acute diverticulitis [K57.92]  Level of Care/Admitting Diagnosis ED Disposition     ED Disposition  Admit   Condition  --   Comment  Hospital Area: [facility] [[id number]]  Level of Care: Med-Surg [16]  May admit patient to [facility] or [facility] if equivalent level of care is available:: No  Covid Evaluation: Asymptomatic - no recent exposure (last 10 days) testing not required  Diagnosis: Acute diverticulitis [[id number]]  Admitting Physician: Bobette Mo [[id number]]  Attending Physician: Bobette Mo [[id number]]  Certification:: I certify this patient will need inpatient services for at least 2 midnights  Expected Medical Readiness: 01/29/2023          Medical History Past Medical History:  Diagnosis Date   Arthritis    Atrial fibrillation (HCC)    Change in bowel habits    Constipation    Depression    Dysrhythmia    A fib   Hypertension    Sleep apnea    CPAP    Allergies Allergies  Allergen Reactions   Shellfish Allergy Shortness Of Breath and Swelling    Throat/tongue swells    IV Location/Drains/Wounds Patient Lines/Drains/Airways Status     Active Line/Drains/Airways      Name Placement date Placement time Site Days   Peripheral IV 01/27/23 20 G Left Antecubital 01/27/23  1000  Antecubital  less than 1   Incision (Closed) 11/09/18 Knee 11/09/18  0913  -- 1540            Labs/Imaging Results for orders placed or performed during the hospital encounter of 01/27/23 (from the past 48 hour(s))  CBC with Differential     Status: Abnormal   Collection Time: 01/27/23  9:56 AM  Result Value Ref Range   WBC 12.9 (H) 4.0 - 10.5 K/uL   RBC 4.63 4.22 - 5.81 MIL/uL   Hemoglobin 14.5 13.0 - 17.0 g/dL   HCT 57.8 46.9 - 62.9 %   MCV 91.6 80.0 - 100.0 fL   MCH 31.3 26.0 - 34.0 pg   MCHC 34.2 30.0 - 36.0 g/dL   RDW 52.8 41.3 - 24.4 %   Platelets 216 150 - 400 K/uL   nRBC 0.0 0.0 - 0.2 %   Neutrophils Relative % 70 %   Neutro Abs 9.0 (H) 1.7 - 7.7 K/uL   Lymphocytes Relative 20 %   Lymphs Abs 2.6 0.7 - 4.0 K/uL   Monocytes Relative 9 %   Monocytes Absolute 1.2 (H) 0.1 - 1.0  K/uL   Eosinophils Relative 1 %   Eosinophils Absolute 0.1 0.0 - 0.5 K/uL   Basophils Relative 0 %   Basophils Absolute 0.0 0.0 - 0.1 K/uL   Immature Granulocytes 0 %   Abs Immature Granulocytes 0.04 0.00 - 0.07 K/uL    Comment: Performed at [facility], [place], [address], [place], [place] [address]  Comprehensive metabolic panel     Status: Abnormal   Collection Time: 01/27/23  9:56 AM  Result Value Ref Range   Sodium 136 135 - 145 mmol/L   Potassium 3.3 (L) 3.5 - 5.1 mmol/L   Chloride 102 98 - 111 mmol/L   CO2 25 22 - 32 mmol/L   Glucose, Bld 113 (H) 70 - 99 mg/dL    Comment: Glucose reference range applies only to samples taken after fasting for at least 8 hours.   BUN 17 8 - 23 mg/dL   Creatinine, Ser 3.24 0.61 - 1.24 mg/dL   Calcium 8.8 (L) 8.9 - 10.3 mg/dL   Total Protein 7.0 6.5 - 8.1 g/dL   Albumin 3.9 3.5 - 5.0 g/dL   AST 20 15 - 41 U/L   ALT 24 0 - 44 U/L   Alkaline Phosphatase 83 38 - 126 U/L   Total Bilirubin 1.0 0.3 - 1.2 mg/dL   GFR, Estimated  >40 >10 mL/min    Comment: (NOTE) Calculated using the CKD-EPI Creatinine Equation (2021)    Anion gap 9 5 - 15    Comment: Performed at [facility], [address], [place], [place] [address]  Lipase, blood     Status: None   Collection Time: 01/27/23  9:56 AM  Result Value Ref Range   Lipase 26 11 - 51 U/L    Comment: Performed at [facility], [address], [place], [place] [address]   CT ABDOMEN PELVIS W CONTRAST  Addendum Date: 01/27/2023   ADDENDUM REPORT: 01/27/2023 11:49 ADDENDUM: Critical Value/emergent results were called by telephone at the time of interpretation on 01/27/2023 at 11:47 am to provider Riki Sheer , who verbally acknowledged these results. Electronically Signed   By: Orvan Falconer M.D.   On: 01/27/2023 11:49   Result Date: 01/27/2023 CLINICAL DATA:  Lower abdominal pain.  History of diverticulitis. EXAM: CT ABDOMEN AND PELVIS WITH CONTRAST TECHNIQUE: Multidetector CT imaging of the abdomen and pelvis was performed using the standard protocol following bolus administration of intravenous contrast. RADIATION DOSE REDUCTION: This exam was performed according to the departmental dose-optimization program which includes automated exposure control, adjustment of the mA and/or kV according to patient size and/or use of iterative reconstruction technique. CONTRAST:  OMNIPAQUE IOHEXOL 300 MG/ML  SOLN COMPARISON:  CT abdomen/pelvis 02/05/2010. FINDINGS: Lower chest: No acute findings. Hepatobiliary: No focal liver abnormality is seen. No gallstones, gallbladder wall thickening, or biliary dilatation. Pancreas: Unremarkable. No pancreatic ductal dilatation or surrounding inflammatory changes. Spleen: Normal in size without focal abnormality. Adrenals/Urinary Tract: Adrenal glands are unremarkable. Kidneys are normal, without renal calculi, focal lesion, or hydronephrosis. Bladder is unremarkable. Stomach/Bowel: Normal stomach and  duodenum. No dilated loops of small bowel. Normal appendix is visualized on axial image 51 series 2. Descending and sigmoid diverticulosis. Focal wall thickening and surrounding inflammation of the proximal sigmoid colon, consistent with acute diverticulitis. Single locule of extraluminal gas along the anterior aspect of the colon (axial image 76 series 2), concerning for early perforation. Vascular/Lymphatic: Aortic atherosclerosis. No enlarged abdominal or pelvic lymph nodes. Reproductive: Prostatomegaly.  Other: No abdominal wall hernia or abnormality. No abdominopelvic ascites. Musculoskeletal: No acute or significant osseous findings. IMPRESSION: 1. Acute sigmoid diverticulitis with single locule of extraluminal gas along the anterior aspect of the colon, concerning for early perforation. No drainable abscess. 2. Prostatomegaly. Aortic Atherosclerosis (ICD10-I70.0). Radiology assistant personnel have been notified to put me in telephone contact with the referring physician or the referring physician's clinical representative in order to discuss these findings. Once this communication is established I will issue an addendum to this report for documentation purposes. Electronically Signed: By: Orvan Falconer M.D. On: 01/27/2023 11:42   CT L-SPINE NO CHARGE  Result Date: 01/27/2023 CLINICAL DATA:  Abdominal pain EXAM: CT LUMBAR SPINE WITHOUT CONTRAST TECHNIQUE: Multidetector CT imaging of the lumbar spine was performed without intravenous contrast administration. Multiplanar CT image reconstructions were also generated. RADIATION DOSE REDUCTION: This exam was performed according to the departmental dose-optimization program which includes automated exposure control, adjustment of the mA and/or kV according to patient size and/or use of iterative reconstruction technique. COMPARISON:  None Available. FINDINGS: Segmentation: 5 lumbar type vertebrae. Alignment: Normal. Vertebrae: No acute fracture or focal  pathologic process. Paraspinal and other soft tissues: There is diverticulosis with focal wall thickening in the sigmoid colon (series 2, image 141). Findings can be seen in the setting of diverticulitis. See same day CT abdomen and pelvis for additional findings. Disc levels: Mild multilevel degenerative changes. No evidence of high-grade spinal canal narrowing. There is mild bilateral neural foraminal narrowing at L4-L5. IMPRESSION: 1. No acute fracture or traumatic listhesis of the lumbar spine. 2. Diverticulosis with focal wall thickening in the sigmoid colon. Findings can be seen in the setting of diverticulitis. See same day CT abdomen and pelvis for additional findings. Electronically Signed   By: Lorenza Cambridge M.D.   On: 01/27/2023 11:35    Pending Labs Unresulted Labs (From admission, onward)     Start     Ordered   01/28/23 0500  HIV Antibody (routine testing w rflx)  (HIV Antibody (Routine testing w reflex) panel)  Tomorrow morning,   R        01/27/23 1222   01/28/23 0500  CBC  Tomorrow morning,   R        01/27/23 1222   01/28/23 0500  Comprehensive metabolic panel  Tomorrow morning,   R        01/27/23 1222   01/27/23 1221  Magnesium  Add-on,   AD        01/27/23 1220   01/27/23 1221  Phosphorus  Add-on,   AD        01/27/23 1220   01/27/23 1001  Urinalysis, Routine w reflex microscopic -Urine, Clean Catch  (ED Abdominal Pain)  Once,   URGENT       Question:  Specimen Source  Answer:  Urine, Clean Catch   01/27/23 1001            Vitals/Pain Today's Vitals   01/27/23 0932 01/27/23 0938 01/27/23 0939 01/27/23 1132  BP: (!) 145/90     Pulse: 65     Resp: 18     Temp: 98.3 F (36.8 C)     TempSrc: Oral     SpO2: 100%     Weight:   92.5 kg   Height:   6\' 4"  (1.93 m)   PainSc:  10-Worst pain ever  5     Isolation Precautions No active isolations  Medications Medications  potassium chloride 10 mEq in  100 mL IVPB (has no administration in time range)  enoxaparin  (LOVENOX) injection 40 mg (has no administration in time range)  0.9 % NaCl with KCl 20 mEq/ L  infusion (has no administration in time range)  acetaminophen (TYLENOL) tablet 650 mg (has no administration in time range)    Or  acetaminophen (TYLENOL) suppository 650 mg (has no administration in time range)  HYDROmorphone (DILAUDID) injection 1 mg (has no administration in time range)  ondansetron (ZOFRAN) tablet 4 mg (has no administration in time range)    Or  ondansetron (ZOFRAN) injection 4 mg (has no administration in time range)  piperacillin-tazobactam (ZOSYN) IVPB 3.375 g (has no administration in time range)  sodium chloride 0.9 % bolus 1,000 mL (0 mLs Intravenous Stopped 01/27/23 1215)  ondansetron (ZOFRAN) injection 4 mg (4 mg Intravenous Given 01/27/23 1010)  HYDROmorphone (DILAUDID) injection 1 mg (1 mg Intravenous Given 01/27/23 1010)  iohexol (OMNIPAQUE) 300 MG/ML solution 100 mL (100 mLs Intravenous Contrast Given 01/27/23 1043)    Mobility walks

## 2023-01-27 NOTE — ED Triage Notes (Signed)
Pt endorses abd pain that begins in his belly button and radiates down into his groin. Pt states it began after dinner, denies any nausea/vomiting or fevers. Pt does endorse polyuria. Had a spinal ablation Tuesday, and hx of diverticulitis.

## 2023-01-28 DIAGNOSIS — K5792 Diverticulitis of intestine, part unspecified, without perforation or abscess without bleeding: Secondary | ICD-10-CM | POA: Diagnosis not present

## 2023-01-28 LAB — CBC
HCT: 38.9 % — ABNORMAL LOW (ref 39.0–52.0)
Hemoglobin: 12.8 g/dL — ABNORMAL LOW (ref 13.0–17.0)
MCH: 30.8 pg (ref 26.0–34.0)
MCHC: 32.9 g/dL (ref 30.0–36.0)
MCV: 93.7 fL (ref 80.0–100.0)
Platelets: 180 10*3/uL (ref 150–400)
RBC: 4.15 MIL/uL — ABNORMAL LOW (ref 4.22–5.81)
RDW: 13.2 % (ref 11.5–15.5)
WBC: 13.5 10*3/uL — ABNORMAL HIGH (ref 4.0–10.5)
nRBC: 0 % (ref 0.0–0.2)

## 2023-01-28 LAB — COMPREHENSIVE METABOLIC PANEL
ALT: 22 U/L (ref 0–44)
AST: 16 U/L (ref 15–41)
Albumin: 3.5 g/dL (ref 3.5–5.0)
Alkaline Phosphatase: 72 U/L (ref 38–126)
Anion gap: 9 (ref 5–15)
BUN: 14 mg/dL (ref 8–23)
CO2: 24 mmol/L (ref 22–32)
Calcium: 8.1 mg/dL — ABNORMAL LOW (ref 8.9–10.3)
Chloride: 104 mmol/L (ref 98–111)
Creatinine, Ser: 0.96 mg/dL (ref 0.61–1.24)
GFR, Estimated: >60 mL/min (ref >60)
Glucose, Bld: 119 mg/dL — ABNORMAL HIGH (ref 70–99)
Potassium: 3.4 mmol/L — ABNORMAL LOW (ref 3.5–5.1)
Sodium: 137 mmol/L (ref 135–145)
Total Bilirubin: 2.1 mg/dL — ABNORMAL HIGH (ref 0.3–1.2)
Total Protein: 6.3 g/dL — ABNORMAL LOW (ref 6.5–8.1)

## 2023-01-28 LAB — HEMOGLOBIN A1C
Hgb A1c MFr Bld: 5.4 % (ref 4.8–5.6)
Mean Plasma Glucose: 108.28 mg/dL

## 2023-01-28 LAB — HIV ANTIBODY (ROUTINE TESTING W REFLEX): HIV Screen 4th Generation wRfx: NONREACTIVE

## 2023-01-28 MED ORDER — POTASSIUM CHLORIDE IN NACL 20-0.9 MEQ/L-% IV SOLN
INTRAVENOUS | Status: DC
  Filled 2023-01-28 (×4): qty 1000

## 2023-01-28 MED ORDER — HYDROMORPHONE HCL 1 MG/ML IJ SOLN
1.0000 mg | INTRAMUSCULAR | Status: DC | PRN
  Administered 2023-01-28 – 2023-01-30 (×16): 1 mg via INTRAVENOUS
  Filled 2023-01-28 (×16): qty 1

## 2023-01-28 MED ORDER — MELATONIN 5 MG PO TABS
5.0000 mg | ORAL_TABLET | Freq: Every evening | ORAL | Status: DC | PRN
  Administered 2023-01-28 – 2023-01-31 (×3): 5 mg via ORAL
  Filled 2023-01-28 (×3): qty 1

## 2023-01-28 NOTE — Progress Notes (Signed)
Mobility Specialist - Progress Note   01/28/23 1151  Mobility  Activity Ambulated with assistance in hallway  Level of Assistance Independent after set-up  Assistive Device Other (Comment) (IV Pole)  Distance Ambulated (ft) 700 ft  Activity Response Tolerated well  Mobility Referral Yes  $Mobility charge 1 Mobility  Mobility Specialist Start Time (ACUTE ONLY) 1132  Mobility Specialist Stop Time (ACUTE ONLY) 1147  Mobility Specialist Time Calculation (min) (ACUTE ONLY) 15 min   Pt received in bed and agreeable to mobility. No complaints during session. Pt to bed after session with all needs met.    [facility]

## 2023-01-28 NOTE — Progress Notes (Signed)
Acute diverticulitis  Subjective: Feels a bit better but still sore, passing min flatus  Objective: Vital signs in last 24 hours: Temp:  [97.8 F (36.6 C)-100 F (37.8 C)] 100 F (37.8 C) (08/24 0314) Pulse Rate:  [63-82] 75 (08/24 0314) Resp:  [16-20] 16 (08/24 0314) BP: (133-171)/(70-90) 157/80 (08/24 0314) SpO2:  [94 %-100 %] 97 % (08/24 0314) FiO2 (%):  [21 %] 21 % (08/23 2200) Weight:  [92.5 kg] 92.5 kg (08/23 0939) Last BM Date : 01/26/23  Intake/Output from previous day: 08/23 0701 - 08/24 0700 In: 1598.7 [I.V.:401.7; IV Piggyback:1197.1] Out: -  Intake/Output this shift: No intake/output data recorded.  General appearance: alert and cooperative GI: soft, non-distended, TTP LLQ  Lab Results:  Results for orders placed or performed during the hospital encounter of 01/27/23 (from the past 24 hour(s))  CBC with Differential     Status: Abnormal   Collection Time: 01/27/23  9:56 AM  Result Value Ref Range   WBC 12.9 (H) 4.0 - 10.5 K/uL   RBC 4.63 4.22 - 5.81 MIL/uL   Hemoglobin 14.5 13.0 - 17.0 g/dL   HCT 16.1 09.6 - 04.5 %   MCV 91.6 80.0 - 100.0 fL   MCH 31.3 26.0 - 34.0 pg   MCHC 34.2 30.0 - 36.0 g/dL   RDW 40.9 81.1 - 91.4 %   Platelets 216 150 - 400 K/uL   nRBC 0.0 0.0 - 0.2 %   Neutrophils Relative % 70 %   Neutro Abs 9.0 (H) 1.7 - 7.7 K/uL   Lymphocytes Relative 20 %   Lymphs Abs 2.6 0.7 - 4.0 K/uL   Monocytes Relative 9 %   Monocytes Absolute 1.2 (H) 0.1 - 1.0 K/uL   Eosinophils Relative 1 %   Eosinophils Absolute 0.1 0.0 - 0.5 K/uL   Basophils Relative 0 %   Basophils Absolute 0.0 0.0 - 0.1 K/uL   Immature Granulocytes 0 %   Abs Immature Granulocytes 0.04 0.00 - 0.07 K/uL  Comprehensive metabolic panel     Status: Abnormal   Collection Time: 01/27/23  9:56 AM  Result Value Ref Range   Sodium 136 135 - 145 mmol/L   Potassium 3.3 (L) 3.5 - 5.1 mmol/L   Chloride 102 98 - 111 mmol/L   CO2 25 22 - 32 mmol/L   Glucose, Bld 113 (H) 70 - 99 mg/dL    BUN 17 8 - 23 mg/dL   Creatinine, Ser 7.82 0.61 - 1.24 mg/dL   Calcium 8.8 (L) 8.9 - 10.3 mg/dL   Total Protein 7.0 6.5 - 8.1 g/dL   Albumin 3.9 3.5 - 5.0 g/dL   AST 20 15 - 41 U/L   ALT 24 0 - 44 U/L   Alkaline Phosphatase 83 38 - 126 U/L   Total Bilirubin 1.0 0.3 - 1.2 mg/dL   GFR, Estimated >95 >62 mL/min   Anion gap 9 5 - 15  Lipase, blood     Status: None   Collection Time: 01/27/23  9:56 AM  Result Value Ref Range   Lipase 26 11 - 51 U/L  Magnesium     Status: None   Collection Time: 01/27/23  9:56 AM  Result Value Ref Range   Magnesium 1.8 1.7 - 2.4 mg/dL  Phosphorus     Status: None   Collection Time: 01/27/23  9:56 AM  Result Value Ref Range   Phosphorus 2.9 2.5 - 4.6 mg/dL  Urinalysis, Routine w reflex microscopic -Urine, Clean Catch  Status: Abnormal   Collection Time: 01/27/23 12:15 PM  Result Value Ref Range   Color, Urine YELLOW YELLOW   APPearance CLEAR CLEAR   Specific Gravity, Urine >1.046 (H) 1.005 - 1.030   pH 6.0 5.0 - 8.0   Glucose, UA NEGATIVE NEGATIVE mg/dL   Hgb urine dipstick SMALL (A) NEGATIVE   Bilirubin Urine NEGATIVE NEGATIVE   Ketones, ur NEGATIVE NEGATIVE mg/dL   Protein, ur NEGATIVE NEGATIVE mg/dL   Nitrite NEGATIVE NEGATIVE   Leukocytes,Ua NEGATIVE NEGATIVE   RBC / HPF 0-5 0 - 5 RBC/hpf   WBC, UA 0-5 0 - 5 WBC/hpf   Bacteria, UA NONE SEEN NONE SEEN   Squamous Epithelial / HPF 0-5 0 - 5 /HPF   Mucus PRESENT   CBC     Status: Abnormal   Collection Time: 01/28/23  3:11 AM  Result Value Ref Range   WBC 13.5 (H) 4.0 - 10.5 K/uL   RBC 4.15 (L) 4.22 - 5.81 MIL/uL   Hemoglobin 12.8 (L) 13.0 - 17.0 g/dL   HCT 95.1 (L) 88.4 - 16.6 %   MCV 93.7 80.0 - 100.0 fL   MCH 30.8 26.0 - 34.0 pg   MCHC 32.9 30.0 - 36.0 g/dL   RDW 06.3 01.6 - 01.0 %   Platelets 180 150 - 400 K/uL   nRBC 0.0 0.0 - 0.2 %  Comprehensive metabolic panel     Status: Abnormal   Collection Time: 01/28/23  3:11 AM  Result Value Ref Range   Sodium 137 135 - 145 mmol/L    Potassium 3.4 (L) 3.5 - 5.1 mmol/L   Chloride 104 98 - 111 mmol/L   CO2 24 22 - 32 mmol/L   Glucose, Bld 119 (H) 70 - 99 mg/dL   BUN 14 8 - 23 mg/dL   Creatinine, Ser 9.32 0.61 - 1.24 mg/dL   Calcium 8.1 (L) 8.9 - 10.3 mg/dL   Total Protein 6.3 (L) 6.5 - 8.1 g/dL   Albumin 3.5 3.5 - 5.0 g/dL   AST 16 15 - 41 U/L   ALT 22 0 - 44 U/L   Alkaline Phosphatase 72 38 - 126 U/L   Total Bilirubin 2.1 (H) 0.3 - 1.2 mg/dL   GFR, Estimated >35 >57 mL/min   Anion gap 9 5 - 15     Studies/Results Radiology     MEDS, Scheduled  aspirin EC  81 mg Oral Daily   bisoprolol  5 mg Oral QPM   enoxaparin (LOVENOX) injection  40 mg Subcutaneous Q24H   hydrALAZINE  50 mg Oral BID   irbesartan  300 mg Oral Daily   pantoprazole (PROTONIX) IV  40 mg Intravenous Q24H     Assessment: Acute diverticulitis, clinically improving No current indications for emergent surgery  Plan: Cont NPO and IV Abx Recheck wbc in AM    LOS: 1 day    Vanita Panda, MD [facility]  Patient's medical decision making was straightforward (25 mins met or exceeded with patient care and documentation).   01/28/2023 7:55 AM

## 2023-01-28 NOTE — Plan of Care (Signed)
Patient awake, alert, orient x4. Pain managed with IV diluadid. C/o of nausea relieved by nausea. Up ambulating in hallway this shift.  Safety precaution maintained.  Problem: Education: Goal: Knowledge of General Education information will improve Description: Including pain rating scale, medication(s)/side effects and non-pharmacologic comfort measures Outcome: Progressing   Problem: Health Behavior/Discharge Planning: Goal: Ability to manage health-related needs will improve Outcome: Progressing   Problem: Clinical Measurements: Goal: Ability to maintain clinical measurements within normal limits will improve Outcome: Progressing Goal: Will remain free from infection Outcome: Progressing Goal: Diagnostic test results will improve Outcome: Progressing Goal: Respiratory complications will improve Outcome: Progressing Goal: Cardiovascular complication will be avoided Outcome: Progressing   Problem: Activity: Goal: Risk for activity intolerance will decrease Outcome: Progressing   Problem: Nutrition: Goal: Adequate nutrition will be maintained Outcome: Progressing   Problem: Coping: Goal: Level of anxiety will decrease Outcome: Progressing   Problem: Elimination: Goal: Will not experience complications related to bowel motility Outcome: Progressing Goal: Will not experience complications related to urinary retention Outcome: Progressing   Problem: Pain Managment: Goal: General experience of comfort will improve Outcome: Progressing   Problem: Safety: Goal: Ability to remain free from injury will improve Outcome: Progressing   Problem: Skin Integrity: Goal: Risk for impaired skin integrity will decrease Outcome: Progressing

## 2023-01-28 NOTE — Progress Notes (Signed)
   01/28/23 2243  BiPAP/CPAP/SIPAP  BiPAP/CPAP/SIPAP Pt Type Adult  BiPAP/CPAP/SIPAP Resmed  FiO2 (%) 21 %  Patient Home Equipment Yes  Safety Check Completed by RT for Home Unit Yes, no issues noted

## 2023-01-28 NOTE — Plan of Care (Signed)
  Problem: Education: Goal: Knowledge of General Education information will improve Description: Including pain rating scale, medication(s)/side effects and non-pharmacologic comfort measures Outcome: Progressing   Problem: Clinical Measurements: Goal: Ability to maintain clinical measurements within normal limits will improve Outcome: Progressing Goal: Will remain free from infection Outcome: Progressing Goal: Diagnostic test results will improve Outcome: Progressing Goal: Respiratory complications will improve Outcome: Progressing Goal: Cardiovascular complication will be avoided Outcome: Progressing  Pt A/Ox4 on RA. IVF running, IV abx administered. PRN pain medication administered x2 overnight for LLQ pain.

## 2023-01-28 NOTE — Progress Notes (Signed)
PROGRESS NOTE    Adam Glass  ZOX:[id number] DOB: 04-21-1956 DOA: 01/27/2023 PCP: Danella Penton, MD  Chief Complaint  Patient presents with   Abdominal Pain    Brief Narrative:   GUNAR TRIMMER is Adam Glass 66 y.o. male with medical history significant of osteoarthritis, paroxysmal atrial fibrillation, prostate cancer, lumbar spondylosis, constipation, tubular adenoma, depression, hypertension, sleep apnea on CPAP who presented to the emergency department complaints of abdominal pain  since yesterday afternoon while he was sitting down watching TV and has gotten progressively worse since then.  He had Sula Fetterly similar episode about 15 years ago that resolved with IV hydration, IV antibiotics and symptoms management.  No nausea, emesis, diarrhea, constipation, melena or hematochezia.  No flank pain, dysuria or hematuria, but feels like he has been going more often to urinate since yesterday.He denied fever, chills, rhinorrhea, sore throat, wheezing or hemoptysis.  No chest pain, palpitations, diaphoresis, PND, orthopnea or pitting edema of the lower extremities.   No polyuria, polydipsia, polyphagia or blurred vision.    Lab work: CBC showed Kyley Laurel white count of 12.9 with 70% neutrophils, hemoglobin 14.5 g/dL and platelets 811.  Lipase was 26 units/L.  Potassium is 3.3 mmol/L, glucose on 113 and calcium 8.8 mg/deciliter.  Calcium level is 8.9 mg/dL after correction to albumin level.   Imaging: CT abdomen/pelvis with contrast showing acute sigmoid diverticulitis with single locule of extraluminal gas along the anterior aspect of the colon, concerning for early perforation.  No drainable abscess.  Prostatomegaly.  Aortic atherosclerosis.  CT L-spine with no acute fracture or traumatic listhesis of the lumbar spine.  Diverticulosis with focal wall thickening in the sigmoid colon suspicious for diverticulitis.   ED course: Initial vital signs were temperature 98.3 F, pulse 65, respiration 18, BP 145/90 mmHg O2 sat  100% on room air.  The patient received 1000 mL of normal saline bolus, hydromorphone 1 mg IVP, ondansetron 4 mg IVP and Zosyn 3.375 g IVPB.  Assessment & Plan:   Principal Problem:   Acute diverticulitis Active Problems:   Benign essential hypertension   OSA on CPAP   Hypokalemia  Acute diverticulitis CT abd/pelvis with acute sigmoid diverticulitis with single locule of extraluminal gas along the anterior aspect of the colon, concerning for early perforation  IV abx Pain meds Appreciate surgery recs - NPO for now  Benign essential hypertension Continue bisoprolol 5 mg p.o. daily. Continue hydralazine 50 mg p.o. twice daily. Continue olmesartan 40 mg p.o. daily or formulary equivalent.     OSA on CPAP Continue CPAP at bedtime.     Hypokalemia Continue K in maintenance fluids    DVT prophylaxis: lovenox Code Status: full Family Communication: wife at bedside Disposition:   Status is: Inpatient Remains inpatient appropriate because: need for IV abx, surgery follow up    Consultants:  surgery  Procedures:  none  Antimicrobials:  Anti-infectives (From admission, onward)    Start     Dose/Rate Route Frequency Ordered Stop   01/27/23 2000  piperacillin-tazobactam (ZOSYN) IVPB 3.375 g  Status:  Discontinued        3.375 g 100 mL/hr over 30 Minutes Intravenous Every 8 hours 01/27/23 1220 01/27/23 1222   01/27/23 1800  piperacillin-tazobactam (ZOSYN) IVPB 3.375 g        3.375 g 12.5 mL/hr over 240 Minutes Intravenous Every 8 hours 01/27/23 1223     01/27/23 1200  piperacillin-tazobactam (ZOSYN) IVPB 3.375 g  Status:  Discontinued  3.375 g 100 mL/hr over 30 Minutes Intravenous  Once 01/27/23 1148 01/27/23 1223       Subjective: Pain is doing ok with pain meds Comes and goes  Objective: Vitals:   01/27/23 2322 01/28/23 0314 01/28/23 1113 01/28/23 1237  BP: 133/70 (!) 157/80 (!) 160/86   Pulse: 74 75 69   Resp: 17 16    Temp: 99.2 F (37.3 C) 100 F  (37.8 C) (!) 100.5 F (38.1 C) 99.3 F (37.4 C)  TempSrc:   Oral   SpO2: 94% 97% 94%   Weight:      Height:        Intake/Output Summary (Last 24 hours) at 01/28/2023 1516 Last data filed at 01/27/2023 1700 Gross per 24 hour  Intake 598.72 ml  Output --  Net 598.72 ml   Filed Weights   01/27/23 0939  Weight: 92.5 kg    Examination:  General exam: Appears calm and comfortable  Respiratory system: unlabored Cardiovascular system: RRR Gastrointestinal system: distended, mildly TTP in LLQ Central nervous system: Alert and oriented. No focal neurological deficits. Extremities: no LEE    Data Reviewed: I have personally reviewed following labs and imaging studies  CBC: Recent Labs  Lab 01/27/23 0956 01/28/23 0311  WBC 12.9* 13.5*  NEUTROABS 9.0*  --   HGB 14.5 12.8*  HCT 42.4 38.9*  MCV 91.6 93.7  PLT 216 180    Basic Metabolic Panel: Recent Labs  Lab 01/27/23 0956 01/28/23 0311  NA 136 137  K 3.3* 3.4*  CL 102 104  CO2 25 24  GLUCOSE 113* 119*  BUN 17 14  CREATININE 0.99 0.96  CALCIUM 8.8* 8.1*  MG 1.8  --   PHOS 2.9  --     GFR: Estimated Creatinine Clearance: 92.9 mL/min (by C-G formula based on SCr of 0.96 mg/dL).  Liver Function Tests: Recent Labs  Lab 01/27/23 0956 01/28/23 0311  AST 20 16  ALT 24 22  ALKPHOS 83 72  BILITOT 1.0 2.1*  PROT 7.0 6.3*  ALBUMIN 3.9 3.5    CBG: No results for input(s): "GLUCAP" in the last 168 hours.   No results found for this or any previous visit (from the past 240 hour(s)).       Radiology Studies: CT ABDOMEN PELVIS W CONTRAST  Addendum Date: 01/27/2023   ADDENDUM REPORT: 01/27/2023 11:49 ADDENDUM: Critical Value/emergent results were called by telephone at the time of interpretation on 01/27/2023 at 11:47 am to provider Riki Sheer , who verbally acknowledged these results. Electronically Signed   By: Orvan Falconer M.D.   On: 01/27/2023 11:49   Result Date: 01/27/2023 CLINICAL DATA:   Lower abdominal pain.  History of diverticulitis. EXAM: CT ABDOMEN AND PELVIS WITH CONTRAST TECHNIQUE: Multidetector CT imaging of the abdomen and pelvis was performed using the standard protocol following bolus administration of intravenous contrast. RADIATION DOSE REDUCTION: This exam was performed according to the departmental dose-optimization program which includes automated exposure control, adjustment of the mA and/or kV according to patient size and/or use of iterative reconstruction technique. CONTRAST:  OMNIPAQUE IOHEXOL 300 MG/ML  SOLN COMPARISON:  CT abdomen/pelvis 02/05/2010. FINDINGS: Lower chest: No acute findings. Hepatobiliary: No focal liver abnormality is seen. No gallstones, gallbladder wall thickening, or biliary dilatation. Pancreas: Unremarkable. No pancreatic ductal dilatation or surrounding inflammatory changes. Spleen: Normal in size without focal abnormality. Adrenals/Urinary Tract: Adrenal glands are unremarkable. Kidneys are normal, without renal calculi, focal lesion, or hydronephrosis. Bladder is unremarkable. Stomach/Bowel: Normal stomach and  duodenum. No dilated loops of small bowel. Normal appendix is visualized on axial image 51 series 2. Descending and sigmoid diverticulosis. Focal wall thickening and surrounding inflammation of the proximal sigmoid colon, consistent with acute diverticulitis. Single locule of extraluminal gas along the anterior aspect of the colon (axial image 76 series 2), concerning for early perforation. Vascular/Lymphatic: Aortic atherosclerosis. No enlarged abdominal or pelvic lymph nodes. Reproductive: Prostatomegaly. Other: No abdominal wall hernia or abnormality. No abdominopelvic ascites. Musculoskeletal: No acute or significant osseous findings. IMPRESSION: 1. Acute sigmoid diverticulitis with single locule of extraluminal gas along the anterior aspect of the colon, concerning for early perforation. No drainable abscess. 2. Prostatomegaly. Aortic  Atherosclerosis (ICD10-I70.0). Radiology assistant personnel have been notified to put me in telephone contact with the referring physician or the referring physician's clinical representative in order to discuss these findings. Once this communication is established I will issue an addendum to this report for documentation purposes. Electronically Signed: By: Orvan Falconer M.D. On: 01/27/2023 11:42   CT L-SPINE NO CHARGE  Result Date: 01/27/2023 CLINICAL DATA:  Abdominal pain EXAM: CT LUMBAR SPINE WITHOUT CONTRAST TECHNIQUE: Multidetector CT imaging of the lumbar spine was performed without intravenous contrast administration. Multiplanar CT image reconstructions were also generated. RADIATION DOSE REDUCTION: This exam was performed according to the departmental dose-optimization program which includes automated exposure control, adjustment of the mA and/or kV according to patient size and/or use of iterative reconstruction technique. COMPARISON:  None Available. FINDINGS: Segmentation: 5 lumbar type vertebrae. Alignment: Normal. Vertebrae: No acute fracture or focal pathologic process. Paraspinal and other soft tissues: There is diverticulosis with focal wall thickening in the sigmoid colon (series 2, image 141). Findings can be seen in the setting of diverticulitis. See same day CT abdomen and pelvis for additional findings. Disc levels: Mild multilevel degenerative changes. No evidence of high-grade spinal canal narrowing. There is mild bilateral neural foraminal narrowing at L4-L5. IMPRESSION: 1. No acute fracture or traumatic listhesis of the lumbar spine. 2. Diverticulosis with focal wall thickening in the sigmoid colon. Findings can be seen in the setting of diverticulitis. See same day CT abdomen and pelvis for additional findings. Electronically Signed   By: Lorenza Cambridge M.D.   On: 01/27/2023 11:35        Scheduled Meds:  aspirin EC  81 mg Oral Daily   bisoprolol  5 mg Oral QPM   enoxaparin  (LOVENOX) injection  40 mg Subcutaneous Q24H   hydrALAZINE  50 mg Oral BID   irbesartan  300 mg Oral Daily   pantoprazole (PROTONIX) IV  40 mg Intravenous Q24H   Continuous Infusions:  0.9 % NaCl with KCl 20 mEq / L 100 mL/hr at 01/28/23 1406   piperacillin-tazobactam (ZOSYN)  IV 3.375 g (01/28/23 1411)     LOS: 1 day    Time spent: over 30 min    Lacretia Nicks, MD [facility]   To contact the attending provider between 7A-7P or the covering provider during after hours 7P-7A, please log into the web site [email or web address] and access using universal  password for that web site. If you do not have the password, please call the hospital operator.  01/28/2023, 3:16 PM

## 2023-01-29 DIAGNOSIS — K5792 Diverticulitis of intestine, part unspecified, without perforation or abscess without bleeding: Secondary | ICD-10-CM | POA: Diagnosis not present

## 2023-01-29 LAB — COMPREHENSIVE METABOLIC PANEL
ALT: 18 U/L (ref 0–44)
AST: 14 U/L — ABNORMAL LOW (ref 15–41)
Albumin: 3.1 g/dL — ABNORMAL LOW (ref 3.5–5.0)
Alkaline Phosphatase: 65 U/L (ref 38–126)
Anion gap: 8 (ref 5–15)
BUN: 13 mg/dL (ref 8–23)
CO2: 23 mmol/L (ref 22–32)
Calcium: 7.9 mg/dL — ABNORMAL LOW (ref 8.9–10.3)
Chloride: 101 mmol/L (ref 98–111)
Creatinine, Ser: 0.88 mg/dL (ref 0.61–1.24)
GFR, Estimated: >60 mL/min (ref >60)
Glucose, Bld: 94 mg/dL (ref 70–99)
Potassium: 3.4 mmol/L — ABNORMAL LOW (ref 3.5–5.1)
Sodium: 132 mmol/L — ABNORMAL LOW (ref 135–145)
Total Bilirubin: 1.8 mg/dL — ABNORMAL HIGH (ref 0.3–1.2)
Total Protein: 5.9 g/dL — ABNORMAL LOW (ref 6.5–8.1)

## 2023-01-29 LAB — PHOSPHORUS: Phosphorus: 2.3 mg/dL — ABNORMAL LOW (ref 2.5–4.6)

## 2023-01-29 LAB — CBC
HCT: 37 % — ABNORMAL LOW (ref 39.0–52.0)
Hemoglobin: 12.1 g/dL — ABNORMAL LOW (ref 13.0–17.0)
MCH: 31.2 pg (ref 26.0–34.0)
MCHC: 32.7 g/dL (ref 30.0–36.0)
MCV: 95.4 fL (ref 80.0–100.0)
Platelets: 165 10*3/uL (ref 150–400)
RBC: 3.88 MIL/uL — ABNORMAL LOW (ref 4.22–5.81)
RDW: 13.1 % (ref 11.5–15.5)
WBC: 9.8 10*3/uL (ref 4.0–10.5)
nRBC: 0 % (ref 0.0–0.2)

## 2023-01-29 LAB — MAGNESIUM: Magnesium: 1.9 mg/dL (ref 1.7–2.4)

## 2023-01-29 MED ORDER — OXYCODONE HCL 5 MG PO TABS
5.0000 mg | ORAL_TABLET | ORAL | Status: DC | PRN
  Administered 2023-01-29 – 2023-01-31 (×8): 10 mg via ORAL
  Filled 2023-01-29 (×9): qty 2

## 2023-01-29 MED ORDER — DOCUSATE SODIUM 100 MG PO CAPS
100.0000 mg | ORAL_CAPSULE | Freq: Two times a day (BID) | ORAL | Status: DC
  Administered 2023-01-29: 100 mg via ORAL
  Filled 2023-01-29 (×7): qty 1

## 2023-01-29 MED ORDER — POLYETHYLENE GLYCOL 3350 17 G PO PACK
17.0000 g | PACK | Freq: Two times a day (BID) | ORAL | Status: DC
  Administered 2023-01-29: 17 g via ORAL
  Filled 2023-01-29 (×3): qty 1

## 2023-01-29 NOTE — Progress Notes (Signed)
   01/29/23 2105  BiPAP/CPAP/SIPAP  BiPAP/CPAP/SIPAP Pt Type Adult  BiPAP/CPAP/SIPAP Resmed  FiO2 (%) 21 %  Patient Home Equipment Yes

## 2023-01-29 NOTE — Plan of Care (Signed)

## 2023-01-29 NOTE — Progress Notes (Signed)
Acute diverticulitis  Subjective: still sore, passing a lot of flatus  Objective: Vital signs in last 24 hours: Temp:  [97.9 F (36.6 C)-100.5 F (38.1 C)] 99 F (37.2 C) (08/25 0520) Pulse Rate:  [66-77] 66 (08/25 0520) Resp:  [16-17] 16 (08/25 0520) BP: (151-160)/(75-86) 157/78 (08/25 0520) SpO2:  [93 %-96 %] 93 % (08/25 0520) FiO2 (%):  [21 %] 21 % (08/24 2243) Last BM Date : 01/26/23  Intake/Output from previous day: 08/24 0701 - 08/25 0700 In: 141.7 [P.O.:60; I.V.:81.7] Out: 550 [Urine:550] Intake/Output this shift: No intake/output data recorded.  General appearance: alert and cooperative GI: soft, non-distended, TTP LLQ  Lab Results:  Results for orders placed or performed during the hospital encounter of 01/27/23 (from the past 24 hour(s))  CBC     Status: Abnormal   Collection Time: 01/29/23  3:27 AM  Result Value Ref Range   WBC 9.8 4.0 - 10.5 K/uL   RBC 3.88 (L) 4.22 - 5.81 MIL/uL   Hemoglobin 12.1 (L) 13.0 - 17.0 g/dL   HCT 08.6 (L) 57.8 - 46.9 %   MCV 95.4 80.0 - 100.0 fL   MCH 31.2 26.0 - 34.0 pg   MCHC 32.7 30.0 - 36.0 g/dL   RDW 62.9 52.8 - 41.3 %   Platelets 165 150 - 400 K/uL   nRBC 0.0 0.0 - 0.2 %  Comprehensive metabolic panel     Status: Abnormal   Collection Time: 01/29/23  3:27 AM  Result Value Ref Range   Sodium 132 (L) 135 - 145 mmol/L   Potassium 3.4 (L) 3.5 - 5.1 mmol/L   Chloride 101 98 - 111 mmol/L   CO2 23 22 - 32 mmol/L   Glucose, Bld 94 70 - 99 mg/dL   BUN 13 8 - 23 mg/dL   Creatinine, Ser 2.44 0.61 - 1.24 mg/dL   Calcium 7.9 (L) 8.9 - 10.3 mg/dL   Total Protein 5.9 (L) 6.5 - 8.1 g/dL   Albumin 3.1 (L) 3.5 - 5.0 g/dL   AST 14 (L) 15 - 41 U/L   ALT 18 0 - 44 U/L   Alkaline Phosphatase 65 38 - 126 U/L   Total Bilirubin 1.8 (H) 0.3 - 1.2 mg/dL   GFR, Estimated >01 >02 mL/min   Anion gap 8 5 - 15  Magnesium     Status: None   Collection Time: 01/29/23  3:27 AM  Result Value Ref Range   Magnesium 1.9 1.7 - 2.4 mg/dL   Phosphorus     Status: Abnormal   Collection Time: 01/29/23  3:27 AM  Result Value Ref Range   Phosphorus 2.3 (L) 2.5 - 4.6 mg/dL     Studies/Results Radiology     MEDS, Scheduled  aspirin EC  81 mg Oral Daily   bisoprolol  5 mg Oral QPM   enoxaparin (LOVENOX) injection  40 mg Subcutaneous Q24H   hydrALAZINE  50 mg Oral BID   irbesartan  300 mg Oral Daily   pantoprazole (PROTONIX) IV  40 mg Intravenous Q24H     Assessment: Acute diverticulitis, clinically improving No current indications for emergent surgery  Plan: Start full liquid diet today PO pain meds Recheck wbc in AM    LOS: 2 days    Vanita Panda, MD [facility]  Patient's medical decision making was straightforward (25 mins met or exceeded with patient care and documentation).   01/29/2023 8:10 AM

## 2023-01-29 NOTE — Progress Notes (Signed)
PROGRESS NOTE    Adam Glass  ZOX:[id number] DOB: 08/04/1955 DOA: 01/27/2023 PCP: Danella Penton, MD  Chief Complaint  Patient presents with   Abdominal Pain    Brief Narrative:   Adam Glass is Adam Glass 67 y.o. male with medical history significant of osteoarthritis, paroxysmal atrial fibrillation, prostate cancer, lumbar spondylosis, constipation, tubular adenoma, depression, hypertension, sleep apnea on CPAP who presented to the emergency department complaints of abdominal pain  since yesterday afternoon while he was sitting down watching TV and has gotten progressively worse since then.  He had Adam Glass similar episode about 15 years ago that resolved with IV hydration, IV antibiotics and symptoms management.  No nausea, emesis, diarrhea, constipation, melena or hematochezia.  No flank pain, dysuria or hematuria, but feels like he has been going more often to urinate since yesterday.He denied fever, chills, rhinorrhea, sore throat, wheezing or hemoptysis.  No chest pain, palpitations, diaphoresis, PND, orthopnea or pitting edema of the lower extremities.   No polyuria, polydipsia, polyphagia or blurred vision.    Lab work: CBC showed Kynlee Koenigsberg white count of 12.9 with 70% neutrophils, hemoglobin 14.5 g/dL and platelets 811.  Lipase was 26 units/L.  Potassium is 3.3 mmol/L, glucose on 113 and calcium 8.8 mg/deciliter.  Calcium level is 8.9 mg/dL after correction to albumin level.   Imaging: CT abdomen/pelvis with contrast showing acute sigmoid diverticulitis with single locule of extraluminal gas along the anterior aspect of the colon, concerning for early perforation.  No drainable abscess.  Prostatomegaly.  Aortic atherosclerosis.  CT L-spine with no acute fracture or traumatic listhesis of the lumbar spine.  Diverticulosis with focal wall thickening in the sigmoid colon suspicious for diverticulitis.   ED course: Initial vital signs were temperature 98.3 F, pulse 65, respiration 18, BP 145/90 mmHg O2 sat  100% on room air.  The patient received 1000 mL of normal saline bolus, hydromorphone 1 mg IVP, ondansetron 4 mg IVP and Zosyn 3.375 g IVPB.  Assessment & Plan:   Principal Problem:   Acute diverticulitis Active Problems:   Benign essential hypertension   OSA on CPAP   Hypokalemia  Acute diverticulitis CT abd/pelvis with acute sigmoid diverticulitis with single locule of extraluminal gas along the anterior aspect of the colon, concerning for early perforation  IV abx Pain meds Appreciate surgery recs - full liquids for now for now  Benign essential hypertension Continue bisoprolol 5 mg p.o. daily. Continue hydralazine 50 mg p.o. twice daily. Continue olmesartan 40 mg p.o. daily or formulary equivalent.     OSA on CPAP Continue CPAP at bedtime.     Hypokalemia Continue K in maintenance fluids    DVT prophylaxis: lovenox Code Status: full Family Communication: wife at bedside Disposition:   Status is: Inpatient Remains inpatient appropriate because: need for IV abx, surgery follow up    Consultants:  surgery  Procedures:  none  Antimicrobials:  Anti-infectives (From admission, onward)    Start     Dose/Rate Route Frequency Ordered Stop   01/27/23 2000  piperacillin-tazobactam (ZOSYN) IVPB 3.375 g  Status:  Discontinued        3.375 g 100 mL/hr over 30 Minutes Intravenous Every 8 hours 01/27/23 1220 01/27/23 1222   01/27/23 1800  piperacillin-tazobactam (ZOSYN) IVPB 3.375 g        3.375 g 12.5 mL/hr over 240 Minutes Intravenous Every 8 hours 01/27/23 1223     01/27/23 1200  piperacillin-tazobactam (ZOSYN) IVPB 3.375 g  Status:  Discontinued  3.375 g 100 mL/hr over 30 Minutes Intravenous  Once 01/27/23 1148 01/27/23 1223       Subjective: Pain worse after he had BM this AM  Objective: Vitals:   01/29/23 0141 01/29/23 0520 01/29/23 0948 01/29/23 1308  BP: (!) 151/75 (!) 157/78 (!) 172/85 (!) 163/84  Pulse: 77 66 67 77  Resp: 16 16    Temp:  99 F  (37.2 C)    TempSrc:  Oral    SpO2: 94% 93% 95% 96%  Weight:      Height:        Intake/Output Summary (Last 24 hours) at 01/29/2023 1352 Last data filed at 01/29/2023 1000 Gross per 24 hour  Intake 141.74 ml  Output 750 ml  Net -608.26 ml   Filed Weights   01/27/23 0939  Weight: 92.5 kg    Examination:  General: No acute distress. Lungs: unlabored Abdomen: mildly distended, mild periumbilical ttp  Neurological: Alert and oriented 3. Moves all extremities 4 with equal strength. Cranial nerves II through XII grossly intact. Extremities: No clubbing or cyanosis. No edema.    Data Reviewed: I have personally reviewed following labs and imaging studies  CBC: Recent Labs  Lab 01/27/23 0956 01/28/23 0311 01/29/23 0327  WBC 12.9* 13.5* 9.8  NEUTROABS 9.0*  --   --   HGB 14.5 12.8* 12.1*  HCT 42.4 38.9* 37.0*  MCV 91.6 93.7 95.4  PLT 216 180 165    Basic Metabolic Panel: Recent Labs  Lab 01/27/23 0956 01/28/23 0311 01/29/23 0327  NA 136 137 132*  K 3.3* 3.4* 3.4*  CL 102 104 101  CO2 25 24 23   GLUCOSE 113* 119* 94  BUN 17 14 13   CREATININE 0.99 0.96 0.88  CALCIUM 8.8* 8.1* 7.9*  MG 1.8  --  1.9  PHOS 2.9  --  2.3*    GFR: Estimated Creatinine Clearance: 101.4 mL/min (by C-G formula based on SCr of 0.88 mg/dL).  Liver Function Tests: Recent Labs  Lab 01/27/23 0956 01/28/23 0311 01/29/23 0327  AST 20 16 14*  ALT 24 22 18   ALKPHOS 83 72 65  BILITOT 1.0 2.1* 1.8*  PROT 7.0 6.3* 5.9*  ALBUMIN 3.9 3.5 3.1*    CBG: No results for input(s): "GLUCAP" in the last 168 hours.   No results found for this or any previous visit (from the past 240 hour(s)).       Radiology Studies: No results found.      Scheduled Meds:  aspirin EC  81 mg Oral Daily   bisoprolol  5 mg Oral QPM   docusate sodium  100 mg Oral BID   enoxaparin (LOVENOX) injection  40 mg Subcutaneous Q24H   hydrALAZINE  50 mg Oral BID   irbesartan  300 mg Oral Daily    pantoprazole (PROTONIX) IV  40 mg Intravenous Q24H   polyethylene glycol  17 g Oral BID   Continuous Infusions:  0.9 % NaCl with KCl 20 mEq / L 10 mL/hr at 01/29/23 0813   piperacillin-tazobactam (ZOSYN)  IV 3.375 g (01/29/23 0600)     LOS: 2 days    Time spent: over 30 min    Lacretia Nicks, MD [facility]   To contact the attending provider between 7A-7P or the covering provider during after hours 7P-7A, please log into the web site [email or web address] and access using universal [place] password for that web site. If you do not have the password, please call the hospital operator.  01/29/2023, 1:52  PM

## 2023-01-30 DIAGNOSIS — K5792 Diverticulitis of intestine, part unspecified, without perforation or abscess without bleeding: Secondary | ICD-10-CM | POA: Diagnosis not present

## 2023-01-30 LAB — CBC WITH DIFFERENTIAL/PLATELET
Abs Immature Granulocytes: 0.04 10*3/uL (ref 0.00–0.07)
Basophils Absolute: 0 10*3/uL (ref 0.0–0.1)
Basophils Relative: 0 %
Eosinophils Absolute: 0.1 10*3/uL (ref 0.0–0.5)
Eosinophils Relative: 1 %
HCT: 40.5 % (ref 39.0–52.0)
Hemoglobin: 13.5 g/dL (ref 13.0–17.0)
Immature Granulocytes: 1 %
Lymphocytes Relative: 20 %
Lymphs Abs: 1.7 10*3/uL (ref 0.7–4.0)
MCH: 31.8 pg (ref 26.0–34.0)
MCHC: 33.3 g/dL (ref 30.0–36.0)
MCV: 95.3 fL (ref 80.0–100.0)
Monocytes Absolute: 1.1 10*3/uL — ABNORMAL HIGH (ref 0.1–1.0)
Monocytes Relative: 13 %
Neutro Abs: 5.5 10*3/uL (ref 1.7–7.7)
Neutrophils Relative %: 65 %
Platelets: 182 10*3/uL (ref 150–400)
RBC: 4.25 MIL/uL (ref 4.22–5.81)
RDW: 12.8 % (ref 11.5–15.5)
WBC: 8.5 10*3/uL (ref 4.0–10.5)
nRBC: 0 % (ref 0.0–0.2)

## 2023-01-30 LAB — MAGNESIUM: Magnesium: 1.9 mg/dL (ref 1.7–2.4)

## 2023-01-30 LAB — COMPREHENSIVE METABOLIC PANEL
ALT: 16 U/L (ref 0–44)
AST: 15 U/L (ref 15–41)
Albumin: 3.4 g/dL — ABNORMAL LOW (ref 3.5–5.0)
Alkaline Phosphatase: 71 U/L (ref 38–126)
Anion gap: 9 (ref 5–15)
BUN: 8 mg/dL (ref 8–23)
CO2: 27 mmol/L (ref 22–32)
Calcium: 8.5 mg/dL — ABNORMAL LOW (ref 8.9–10.3)
Chloride: 99 mmol/L (ref 98–111)
Creatinine, Ser: 0.87 mg/dL (ref 0.61–1.24)
GFR, Estimated: >60 mL/min (ref >60)
Glucose, Bld: 116 mg/dL — ABNORMAL HIGH (ref 70–99)
Potassium: 3 mmol/L — ABNORMAL LOW (ref 3.5–5.1)
Sodium: 135 mmol/L (ref 135–145)
Total Bilirubin: 1.1 mg/dL (ref 0.3–1.2)
Total Protein: 6.8 g/dL (ref 6.5–8.1)

## 2023-01-30 LAB — PHOSPHORUS: Phosphorus: 3.3 mg/dL (ref 2.5–4.6)

## 2023-01-30 MED ORDER — ACETAMINOPHEN 500 MG PO TABS
1000.0000 mg | ORAL_TABLET | Freq: Three times a day (TID) | ORAL | Status: DC
  Administered 2023-01-30 – 2023-02-01 (×5): 1000 mg via ORAL
  Filled 2023-01-30 (×5): qty 2

## 2023-01-30 MED ORDER — SIMETHICONE 80 MG PO CHEW
80.0000 mg | CHEWABLE_TABLET | Freq: Four times a day (QID) | ORAL | Status: DC | PRN
  Administered 2023-01-30: 80 mg via ORAL
  Filled 2023-01-30: qty 1

## 2023-01-30 MED ORDER — ORAL CARE MOUTH RINSE: 15.0000 mL | OROMUCOSAL | Status: DC | PRN

## 2023-01-30 MED ORDER — ACETAMINOPHEN 325 MG PO TABS: 650.0000 mg | ORAL_TABLET | Freq: Four times a day (QID) | ORAL | Status: DC | PRN

## 2023-01-30 MED ORDER — LIDOCAINE 5 % EX PTCH
3.0000 | MEDICATED_PATCH | Freq: Every day | CUTANEOUS | Status: DC
  Administered 2023-01-30 – 2023-02-01 (×3): 3 via TRANSDERMAL
  Filled 2023-01-30 (×4): qty 3

## 2023-01-30 MED ORDER — METHOCARBAMOL 500 MG PO TABS
500.0000 mg | ORAL_TABLET | Freq: Four times a day (QID) | ORAL | Status: DC | PRN
  Administered 2023-01-30: 500 mg via ORAL
  Filled 2023-01-30: qty 1

## 2023-01-30 MED ORDER — POLYETHYLENE GLYCOL 3350 17 G PO PACK: 17.0000 g | PACK | Freq: Every day | ORAL | Status: DC | PRN

## 2023-01-30 MED ORDER — METHOCARBAMOL 1000 MG/10ML IJ SOLN: 500.0000 mg | Freq: Four times a day (QID) | INTRAVENOUS | Status: DC | PRN

## 2023-01-30 MED ORDER — ENSURE ENLIVE PO LIQD
237.0000 mL | Freq: Three times a day (TID) | ORAL | Status: DC
  Administered 2023-01-30 – 2023-02-01 (×7): 237 mL via ORAL

## 2023-01-30 MED ORDER — POTASSIUM CHLORIDE CRYS ER 20 MEQ PO TBCR
40.0000 meq | EXTENDED_RELEASE_TABLET | ORAL | Status: AC
  Administered 2023-01-30 (×2): 40 meq via ORAL
  Filled 2023-01-30 (×2): qty 2

## 2023-01-30 NOTE — Progress Notes (Signed)
Progress Note     Subjective: Pt reports that overall pain is improving but still present intermittently, having more gas pain at this time. Had some diarrhea overnight and did not get much sleep secondary to this. Tolerating FLD.   Objective: Vital signs in last 24 hours: Temp:  [98.4 F (36.9 C)-98.9 F (37.2 C)] 98.6 F (37 C) (08/26 0513) Pulse Rate:  [62-77] 62 (08/26 0513) Resp:  [18-19] 19 (08/26 0513) BP: (158-172)/(84-94) 158/94 (08/26 0513) SpO2:  [93 %-96 %] 93 % (08/26 0513) FiO2 (%):  [21 %] 21 % (08/25 2105) Last BM Date : 01/29/23  Intake/Output from previous day: 08/25 0701 - 08/26 0700 In: 940 [P.O.:740; IV Piggyback:200] Out: 600 [Urine:600] Intake/Output this shift: No intake/output data recorded.  PE: General: pleasant, WD, WN male who is laying in bed in NAD Lungs:  Respiratory effort nonlabored Abd: soft, mild ttp in suprapubic abdomen with some voluntary guarding, no peritonitis, ND MS: all 4 extremities are symmetrical with no cyanosis, clubbing, or edema. Psych: A&Ox3 with an appropriate affect.    Lab Results:  Recent Labs    01/29/23 0327 01/30/23 0409  WBC 9.8 8.5  HGB 12.1* 13.5  HCT 37.0* 40.5  PLT 165 182   BMET Recent Labs    01/29/23 0327 01/30/23 0409  NA 132* 135  K 3.4* 3.0*  CL 101 99  CO2 23 27  GLUCOSE 94 116*  BUN 13 8  CREATININE 0.88 0.87  CALCIUM 7.9* 8.5*   PT/INR No results for input(s): "LABPROT", "INR" in the last 72 hours. CMP     Component Value Date/Time   NA 135 01/30/2023 0409   K 3.0 (L) 01/30/2023 0409   CL 99 01/30/2023 0409   CO2 27 01/30/2023 0409   GLUCOSE 116 (H) 01/30/2023 0409   BUN 8 01/30/2023 0409   CREATININE 0.87 01/30/2023 0409   CALCIUM 8.5 (L) 01/30/2023 0409   PROT 6.8 01/30/2023 0409   ALBUMIN 3.4 (L) 01/30/2023 0409   AST 15 01/30/2023 0409   ALT 16 01/30/2023 0409   ALKPHOS 71 01/30/2023 0409   BILITOT 1.1 01/30/2023 0409   GFRNONAA >60 01/30/2023 0409   GFRAA >60  11/06/2018 0902   Lipase     Component Value Date/Time   LIPASE 26 01/27/2023 0956       Studies/Results: No results found.  Anti-infectives: Anti-infectives (From admission, onward)    Start     Dose/Rate Route Frequency Ordered Stop   01/27/23 2000  piperacillin-tazobactam (ZOSYN) IVPB 3.375 g  Status:  Discontinued        3.375 g 100 mL/hr over 30 Minutes Intravenous Every 8 hours 01/27/23 1220 01/27/23 1222   01/27/23 1800  piperacillin-tazobactam (ZOSYN) IVPB 3.375 g        3.375 g 12.5 mL/hr over 240 Minutes Intravenous Every 8 hours 01/27/23 1223     01/27/23 1200  piperacillin-tazobactam (ZOSYN) IVPB 3.375 g  Status:  Discontinued        3.375 g 100 mL/hr over 30 Minutes Intravenous  Once 01/27/23 1148 01/27/23 1223        Assessment/Plan  Diverticulitis with microperforation - leukocytosis resolved and afebrile, HD stable - tolerating FLD and having bowel function but complaining of cramping from gas - added simethicone this AM and encouraged mobilization - add ensure  - no indication for emergent surgical intervention at this time  - Recommend updated colonoscopy when this resolves in 6-8 weeks given his last one was 5 years  ago.  We will follow with you at this time.     FEN - FLD VTE - Lovenox ID - [email or web address]   A fib - not on anticoagulation HTN OSA Arthritis  Prostate cancer   LOS: 3 days   I reviewed hospitalist notes, last 24 h vitals and pain scores, last 48 h intake and output, and last 24 h labs and trends.    Juliet Rude, [facility] 01/30/2023, 9:03 AM Please see Amion for pager number during day hours 7:00am-4:30pm

## 2023-01-30 NOTE — Progress Notes (Signed)
PROGRESS NOTE    Adam Glass  NFA:[id number] DOB: [birth year] DOA: 01/27/2023 PCP: Danella Penton, MD  Chief Complaint  Patient presents with   Abdominal Pain    Brief Narrative:   Adam Glass is Adam Glass 67 y.o. male with medical history significant of osteoarthritis, paroxysmal atrial fibrillation, prostate cancer, lumbar spondylosis, constipation, tubular adenoma, depression, hypertension, sleep apnea on CPAP who presented to the emergency department complaints of abdominal pain  since yesterday afternoon while he was sitting down watching TV and has gotten progressively worse since then.  He had Adam Glass similar episode about 15 years ago that resolved with IV hydration, IV antibiotics and symptoms management.  No nausea, emesis, diarrhea, constipation, melena or hematochezia.  No flank pain, dysuria or hematuria, but feels like he has been going more often to urinate since yesterday.He denied fever, chills, rhinorrhea, sore throat, wheezing or hemoptysis.  No chest pain, palpitations, diaphoresis, PND, orthopnea or pitting edema of the lower extremities.   No polyuria, polydipsia, polyphagia or blurred vision.    Lab work: CBC showed Adam Glass white count of 12.9 with 70% neutrophils, hemoglobin 14.5 g/dL and platelets 469.  Lipase was 26 units/L.  Potassium is 3.3 mmol/L, glucose on 113 and calcium 8.8 mg/deciliter.  Calcium level is 8.9 mg/dL after correction to albumin level.   Imaging: CT abdomen/pelvis with contrast showing acute sigmoid diverticulitis with single locule of extraluminal gas along the anterior aspect of the colon, concerning for early perforation.  No drainable abscess.  Prostatomegaly.  Aortic atherosclerosis.  CT L-spine with no acute fracture or traumatic listhesis of the lumbar spine.  Diverticulosis with focal wall thickening in the sigmoid colon suspicious for diverticulitis.   ED course: Initial vital signs were temperature 98.3 F, pulse 65, respiration 18, BP 145/90 mmHg O2 sat  100% on room air.  The patient received 1000 mL of normal saline bolus, hydromorphone 1 mg IVP, ondansetron 4 mg IVP and Zosyn 3.375 g IVPB.  Assessment & Plan:   Principal Problem:   Acute diverticulitis Active Problems:   Benign essential hypertension   OSA on CPAP   Hypokalemia  Acute diverticulitis CT abd/pelvis with acute sigmoid diverticulitis with single locule of extraluminal gas along the anterior aspect of the colon, concerning for early perforation  IV abx Pain meds Appreciate surgery recs - full liquids for now for now  Benign essential hypertension Continue bisoprolol 5 mg p.o. daily. Continue hydralazine 50 mg p.o. twice daily. Continue olmesartan 40 mg p.o. daily or formulary equivalent.     OSA on CPAP Continue CPAP at bedtime.     Hypokalemia Continue K in maintenance fluids    DVT prophylaxis: lovenox Code Status: full Family Communication: wife at bedside Disposition:   Status is: Inpatient Remains inpatient appropriate because: need for IV abx, surgery follow up    Consultants:  surgery  Procedures:  none  Antimicrobials:  Anti-infectives (From admission, onward)    Start     Dose/Rate Route Frequency Ordered Stop   01/27/23 2000  piperacillin-tazobactam (ZOSYN) IVPB 3.375 g  Status:  Discontinued        3.375 g 100 mL/hr over 30 Minutes Intravenous Every 8 hours 01/27/23 1220 01/27/23 1222   01/27/23 1800  piperacillin-tazobactam (ZOSYN) IVPB 3.375 g        3.375 g 12.5 mL/hr over 240 Minutes Intravenous Every 8 hours 01/27/23 1223     01/27/23 1200  piperacillin-tazobactam (ZOSYN) IVPB 3.375 g  Status:  Discontinued  3.375 g 100 mL/hr over 30 Minutes Intravenous  Once 01/27/23 1148 01/27/23 1223       Subjective: Bad night, episode of diarrhea Continued pain after eating  Objective: Vitals:   01/29/23 0948 01/29/23 1308 01/29/23 1948 01/30/23 0513  BP: (!) 172/85 (!) 163/84 (!) 172/91 (!) 158/94  Pulse: 67 77 67 62   Resp:  18 19 19   Temp:  98.4 F (36.9 C) 98.9 F (37.2 C) 98.6 F (37 C)  TempSrc:  Axillary Oral Oral  SpO2: 95% 96% 94% 93%  Weight:      Height:        Intake/Output Summary (Last 24 hours) at 01/30/2023 1424 Last data filed at 01/30/2023 0413 Gross per 24 hour  Intake --  Output 400 ml  Net -400 ml   Filed Weights   01/27/23 0939  Weight: 92.5 kg    Examination:  General: No acute distress. Cardiovascular: RRR Lungs: unlabored Abdomen: distended, continued diffuse mild abdominal discomfort Neurological: Alert and oriented 3. Moves all extremities 4 with equal strength. Cranial nerves II through XII grossly intact. Extremities: No clubbing or cyanosis. No edema.  Data Reviewed: I have personally reviewed following labs and imaging studies  CBC: Recent Labs  Lab 01/27/23 0956 01/28/23 0311 01/29/23 0327 01/30/23 0409  WBC 12.9* 13.5* 9.8 8.5  NEUTROABS 9.0*  --   --  5.5  HGB 14.5 12.8* 12.1* 13.5  HCT 42.4 38.9* 37.0* 40.5  MCV 91.6 93.7 95.4 95.3  PLT 216 180 165 182    Basic Metabolic Panel: Recent Labs  Lab 01/27/23 0956 01/28/23 0311 01/29/23 0327 01/30/23 0409  NA 136 137 132* 135  K 3.3* 3.4* 3.4* 3.0*  CL 102 104 101 99  CO2 25 24 23 27   GLUCOSE 113* 119* 94 116*  BUN 17 14 13 8   CREATININE 0.99 0.96 0.88 0.87  CALCIUM 8.8* 8.1* 7.9* 8.5*  MG 1.8  --  1.9 1.9  PHOS 2.9  --  2.3* 3.3    GFR: Estimated Creatinine Clearance: 102.5 mL/min (by C-G formula based on SCr of 0.87 mg/dL).  Liver Function Tests: Recent Labs  Lab 01/27/23 0956 01/28/23 0311 01/29/23 0327 01/30/23 0409  AST 20 16 14* 15  ALT 24 22 18 16   ALKPHOS 83 72 65 71  BILITOT 1.0 2.1* 1.8* 1.1  PROT 7.0 6.3* 5.9* 6.8  ALBUMIN 3.9 3.5 3.1* 3.4*    CBG: No results for input(s): "GLUCAP" in the last 168 hours.   No results found for this or any previous visit (from the past 240 hour(s)).       Radiology Studies: No results found.      Scheduled  Meds:  aspirin EC  81 mg Oral Daily   bisoprolol  5 mg Oral QPM   docusate sodium  100 mg Oral BID   enoxaparin (LOVENOX) injection  40 mg Subcutaneous Q24H   feeding supplement  237 mL Oral TID BM   hydrALAZINE  50 mg Oral BID   irbesartan  300 mg Oral Daily   pantoprazole (PROTONIX) IV  40 mg Intravenous Q24H   polyethylene glycol  17 g Oral BID   Continuous Infusions:  0.9 % NaCl with KCl 20 mEq / L 10 mL/hr at 01/29/23 1701   piperacillin-tazobactam (ZOSYN)  IV 3.375 g (01/30/23 1317)     LOS: 3 days    Time spent: over 30 min    Lacretia Nicks, MD [facility]   To contact the attending  provider between 7A-7P or the covering provider during after hours 7P-7A, please log into the web site [email or web address] and access using universal [place] password for that web site. If you do not have the password, please call the hospital operator.  01/30/2023, 2:24 PM

## 2023-01-30 NOTE — Progress Notes (Signed)
   01/30/23 1707  TOC Brief Assessment  Insurance and Status Reviewed  Patient has primary care physician Yes  Home environment has been reviewed yes  Prior level of function: independent  Prior/Current Home Services No current home services  Social Determinants of Health Reivew SDOH reviewed no interventions necessary  Readmission risk has been reviewed Yes  Transition of care needs no transition of care needs at this time

## 2023-01-30 NOTE — Plan of Care (Signed)

## 2023-01-30 NOTE — Progress Notes (Signed)
Mobility Specialist - Progress Note   01/30/23 1326  Mobility  Activity Ambulated with assistance in hallway  Level of Assistance Modified independent, requires aide device or extra time  Assistive Device None  Distance Ambulated (ft) 700 ft  Activity Response Tolerated well  Mobility Referral Yes  $Mobility charge 1 Mobility  Mobility Specialist Start Time (ACUTE ONLY) 1255  Mobility Specialist Stop Time (ACUTE ONLY) 1312  Mobility Specialist Time Calculation (min) (ACUTE ONLY) 17 min   Pt received in bed and agreed to mobility with no issue throughout session. Pt had no hands on needs throughout session and returned to bed with all needs met.  Marilynne Halsted Mobility Specialist

## 2023-01-31 DIAGNOSIS — K5792 Diverticulitis of intestine, part unspecified, without perforation or abscess without bleeding: Secondary | ICD-10-CM | POA: Diagnosis not present

## 2023-01-31 LAB — COMPREHENSIVE METABOLIC PANEL
ALT: 19 U/L (ref 0–44)
AST: 19 U/L (ref 15–41)
Albumin: 3.2 g/dL — ABNORMAL LOW (ref 3.5–5.0)
Alkaline Phosphatase: 64 U/L (ref 38–126)
Anion gap: 12 (ref 5–15)
BUN: 11 mg/dL (ref 8–23)
CO2: 27 mmol/L (ref 22–32)
Calcium: 8.8 mg/dL — ABNORMAL LOW (ref 8.9–10.3)
Chloride: 96 mmol/L — ABNORMAL LOW (ref 98–111)
Creatinine, Ser: 0.9 mg/dL (ref 0.61–1.24)
GFR, Estimated: >60 mL/min (ref >60)
Glucose, Bld: 99 mg/dL (ref 70–99)
Potassium: 4 mmol/L (ref 3.5–5.1)
Sodium: 135 mmol/L (ref 135–145)
Total Bilirubin: 0.7 mg/dL (ref 0.3–1.2)
Total Protein: 6.2 g/dL — ABNORMAL LOW (ref 6.5–8.1)

## 2023-01-31 LAB — CBC WITH DIFFERENTIAL/PLATELET
Abs Immature Granulocytes: 0.01 10*3/uL (ref 0.00–0.07)
Basophils Absolute: 0 10*3/uL (ref 0.0–0.1)
Basophils Relative: 0 %
Eosinophils Absolute: 0.3 10*3/uL (ref 0.0–0.5)
Eosinophils Relative: 4 %
HCT: 38 % — ABNORMAL LOW (ref 39.0–52.0)
Hemoglobin: 12.5 g/dL — ABNORMAL LOW (ref 13.0–17.0)
Immature Granulocytes: 0 %
Lymphocytes Relative: 28 %
Lymphs Abs: 2 10*3/uL (ref 0.7–4.0)
MCH: 31.4 pg (ref 26.0–34.0)
MCHC: 32.9 g/dL (ref 30.0–36.0)
MCV: 95.5 fL (ref 80.0–100.0)
Monocytes Absolute: 1.2 10*3/uL — ABNORMAL HIGH (ref 0.1–1.0)
Monocytes Relative: 16 %
Neutro Abs: 3.7 10*3/uL (ref 1.7–7.7)
Neutrophils Relative %: 52 %
Platelets: 208 10*3/uL (ref 150–400)
RBC: 3.98 MIL/uL — ABNORMAL LOW (ref 4.22–5.81)
RDW: 12.8 % (ref 11.5–15.5)
WBC: 7.2 10*3/uL (ref 4.0–10.5)
nRBC: 0 % (ref 0.0–0.2)

## 2023-01-31 LAB — MAGNESIUM: Magnesium: 2 mg/dL (ref 1.7–2.4)

## 2023-01-31 LAB — PHOSPHORUS: Phosphorus: 3.7 mg/dL (ref 2.5–4.6)

## 2023-01-31 LAB — GLUCOSE, CAPILLARY: Glucose-Capillary: 131 mg/dL — ABNORMAL HIGH (ref 70–99)

## 2023-01-31 MED ORDER — METHOCARBAMOL 500 MG PO TABS
500.0000 mg | ORAL_TABLET | Freq: Four times a day (QID) | ORAL | Status: DC | PRN
  Administered 2023-01-31 – 2023-02-01 (×2): 500 mg via ORAL
  Filled 2023-01-31 (×2): qty 1

## 2023-01-31 NOTE — Progress Notes (Signed)
PROGRESS NOTE    [name]  ZOX:[id number] DOB: [birth year] DOA: 01/27/2023 PCP: Danella Penton, MD  Chief Complaint  Patient presents with   Abdominal Pain    Brief Narrative:   Adam Glass is Adam Glass 67 y.o. male with medical history significant of osteoarthritis, paroxysmal atrial fibrillation, prostate cancer, lumbar spondylosis, constipation, tubular adenoma, depression, hypertension, sleep apnea on CPAP who presented to the emergency department complaints of abdominal pain  since yesterday afternoon while he was sitting down watching TV and has gotten progressively worse since then.    Admitted with diverticulitis with findings concerning for early perforation.   Assessment & Plan:   Principal Problem:   Acute diverticulitis Active Problems:   Benign essential hypertension   OSA on CPAP   Hypokalemia  Acute diverticulitis CT abd/pelvis with acute sigmoid diverticulitis with single locule of extraluminal gas along the anterior aspect of the colon, concerning for early perforation  PO abx per surgery Pain meds Appreciate surgery recs - soft diet, PO abx - possible d/c 8/28 AM.  Needs updated colonoscopy in 6-8 weeks.  Back Pain  Recent Bilateral L3 and L4 Medial Branch and L5 Dorsal Ramus Radiofrequency Neurotomy 8/26 PM had worsening L sided lower back pain that seemed musculoskeletal in origin, no numbness, tingling, weakness (other than that due to pain).  This is much improved today after scheduling APAP, adding muscle relaxer, and topical lidocaine patches. PT  Diarrhea Suspect due to liquid diet and diverticulitis Follow  Benign essential hypertension Continue bisoprolol 5 mg p.o. daily. Continue hydralazine 50 mg p.o. twice daily. Continue olmesartan 40 mg p.o. daily or formulary equivalent.     OSA on CPAP Continue CPAP at bedtime.     Hypokalemia Continue K in maintenance fluids    DVT prophylaxis: lovenox Code Status: full Family Communication:  wife at bedside Disposition:   Status is: Inpatient Remains inpatient appropriate because: need for IV abx, surgery follow up    Consultants:  surgery  Procedures:  none  Antimicrobials:  Anti-infectives (From admission, onward)    Start     Dose/Rate Route Frequency Ordered Stop   01/27/23 2000  piperacillin-tazobactam (ZOSYN) IVPB 3.375 g  Status:  Discontinued        3.375 g 100 mL/hr over 30 Minutes Intravenous Every 8 hours 01/27/23 1220 01/27/23 1222   01/27/23 1800  piperacillin-tazobactam (ZOSYN) IVPB 3.375 g        3.375 g 12.5 mL/hr over 240 Minutes Intravenous Every 8 hours 01/27/23 1223     01/27/23 1200  piperacillin-tazobactam (ZOSYN) IVPB 3.375 g  Status:  Discontinued        3.375 g 100 mL/hr over 30 Minutes Intravenous  Once 01/27/23 1148 01/27/23 1223       Subjective: Continued nightly diarrhea Otherwise feeling better  Objective: Vitals:   01/30/23 2037 01/31/23 0550 01/31/23 1100 01/31/23 1528  BP: (!) 172/95 (!) 172/86 (!) 163/81 (!) 146/87  Pulse: 65 (!) 58 60 63  Resp: 17 17 18 16   Temp: 98.9 F (37.2 C) 98.1 F (36.7 C) 98.4 F (36.9 C) 98 F (36.7 C)  TempSrc: Oral  Oral   SpO2: 93% 96% 96% 93%  Weight:      Height:       No intake or output data in the 24 hours ending 01/31/23 1536  Filed Weights   01/27/23 0939  Weight: 92.5 kg    Examination:  General: No acute distress. Seen walking to bed from outside Cardiovascular:  RRR Lungs: unlabored Abdomen: mild LLQ TTP  Neurological: Alert and oriented 3. Moves all extremities 4 with equal strength. Cranial nerves II through XII grossly intact. Extremities: No clubbing or cyanosis. No edema  Data Reviewed: I have personally reviewed following labs and imaging studies  CBC: Recent Labs  Lab 01/27/23 0956 01/28/23 0311 01/29/23 0327 01/30/23 0409 01/31/23 0325  WBC 12.9* 13.5* 9.8 8.5 7.2  NEUTROABS 9.0*  --   --  5.5 3.7  HGB 14.5 12.8* 12.1* 13.5 12.5*  HCT 42.4  38.9* 37.0* 40.5 38.0*  MCV 91.6 93.7 95.4 95.3 95.5  PLT 216 180 165 182 208    Basic Metabolic Panel: Recent Labs  Lab 01/27/23 0956 01/28/23 0311 01/29/23 0327 01/30/23 0409 01/31/23 0325  NA 136 137 132* 135 135  K 3.3* 3.4* 3.4* 3.0* 4.0  CL 102 104 101 99 96*  CO2 25 24 23 27 27   GLUCOSE 113* 119* 94 116* 99  BUN 17 14 13 8 11   CREATININE 0.99 0.96 0.88 0.87 0.90  CALCIUM 8.8* 8.1* 7.9* 8.5* 8.8*  MG 1.8  --  1.9 1.9 2.0  PHOS 2.9  --  2.3* 3.3 3.7    GFR: Estimated Creatinine Clearance: 99.1 mL/min (by C-G formula based on SCr of 0.9 mg/dL).  Liver Function Tests: Recent Labs  Lab 01/27/23 0956 01/28/23 0311 01/29/23 0327 01/30/23 0409 01/31/23 0325  AST 20 16 14* 15 19  ALT 24 22 18 16 19   ALKPHOS 83 72 65 71 64  BILITOT 1.0 2.1* 1.8* 1.1 0.7  PROT 7.0 6.3* 5.9* 6.8 6.2*  ALBUMIN 3.9 3.5 3.1* 3.4* 3.2*    CBG: No results for input(s): "GLUCAP" in the last 168 hours.   No results found for this or any previous visit (from the past 240 hour(s)).       Radiology Studies: No results found.      Scheduled Meds:  acetaminophen  1,000 mg Oral Q8H   aspirin EC  81 mg Oral Daily   bisoprolol  5 mg Oral QPM   docusate sodium  100 mg Oral BID   enoxaparin (LOVENOX) injection  40 mg Subcutaneous Q24H   feeding supplement  237 mL Oral TID BM   hydrALAZINE  50 mg Oral BID   irbesartan  300 mg Oral Daily   lidocaine  3 patch Transdermal Daily   pantoprazole (PROTONIX) IV  40 mg Intravenous Q24H   Continuous Infusions:  0.9 % NaCl with KCl 20 mEq / L 10 mL/hr at 01/29/23 1701   piperacillin-tazobactam (ZOSYN)  IV 3.375 g (01/31/23 1438)     LOS: 4 days    Time spent: over 30 min    Lacretia Nicks, MD [facility]   To contact the attending provider between 7A-7P or the covering provider during after hours 7P-7A, please log into the web site [email or web address] and access using universal [place] password for that web site. If you do  not have the password, please call the hospital operator.  01/31/2023, 3:36 PM

## 2023-01-31 NOTE — Progress Notes (Signed)
Progress Note     Subjective: Pain improved. Reports poor appetite. Expresses concern about amount of flatus and incontinence of stool.   Objective: Vital signs in last 24 hours: Temp:  [97.6 F (36.4 C)-98.9 F (37.2 C)] 98.1 F (36.7 C) (08/27 0550) Pulse Rate:  [58-65] 58 (08/27 0550) Resp:  [16-17] 17 (08/27 0550) BP: (145-172)/(81-95) 172/86 (08/27 0550) SpO2:  [93 %-98 %] 96 % (08/27 0550) FiO2 (%):  [21 %] 21 % (08/26 2313) Last BM Date : 01/30/23  Intake/Output from previous day: No intake/output data recorded. Intake/Output this shift: No intake/output data recorded.  PE: General: pleasant, WD, WN male who is laying in bed in NAD Lungs:  Respiratory effort nonlabored Abd: soft, mild ttp in suprapubic abdomen without guarding. no peritonitis, ND MS: all 4 extremities are symmetrical with no cyanosis, clubbing, or edema. Psych: A&Ox3 with an appropriate affect.    Lab Results:  Recent Labs    01/30/23 0409 01/31/23 0325  WBC 8.5 7.2  HGB 13.5 12.5*  HCT 40.5 38.0*  PLT 182 208   BMET Recent Labs    01/30/23 0409 01/31/23 0325  NA 135 135  K 3.0* 4.0  CL 99 96*  CO2 27 27  GLUCOSE 116* 99  BUN 8 11  CREATININE 0.87 0.90  CALCIUM 8.5* 8.8*   PT/INR No results for input(s): "LABPROT", "INR" in the last 72 hours. CMP     Component Value Date/Time   NA 135 01/31/2023 0325   K 4.0 01/31/2023 0325   CL 96 (L) 01/31/2023 0325   CO2 27 01/31/2023 0325   GLUCOSE 99 01/31/2023 0325   BUN 11 01/31/2023 0325   CREATININE 0.90 01/31/2023 0325   CALCIUM 8.8 (L) 01/31/2023 0325   PROT 6.2 (L) 01/31/2023 0325   ALBUMIN 3.2 (L) 01/31/2023 0325   AST 19 01/31/2023 0325   ALT 19 01/31/2023 0325   ALKPHOS 64 01/31/2023 0325   BILITOT 0.7 01/31/2023 0325   GFRNONAA >60 01/31/2023 0325   GFRAA >60 11/06/2018 0902   Lipase     Component Value Date/Time   LIPASE 26 01/27/2023 0956       Studies/Results: No results  found.  Anti-infectives: Anti-infectives (From admission, onward)    Start     Dose/Rate Route Frequency Ordered Stop   01/27/23 2000  piperacillin-tazobactam (ZOSYN) IVPB 3.375 g  Status:  Discontinued        3.375 g 100 mL/hr over 30 Minutes Intravenous Every 8 hours 01/27/23 1220 01/27/23 1222   01/27/23 1800  piperacillin-tazobactam (ZOSYN) IVPB 3.375 g        3.375 g 12.5 mL/hr over 240 Minutes Intravenous Every 8 hours 01/27/23 1223     01/27/23 1200  piperacillin-tazobactam (ZOSYN) IVPB 3.375 g  Status:  Discontinued        3.375 g 100 mL/hr over 30 Minutes Intravenous  Once 01/27/23 1148 01/27/23 1223        Assessment/Plan  Diverticulitis with microperforation - leukocytosis resolved and afebrile, HD stable - pain improving. Having bowel function. Advance diet and monitor.  - no indication for emergent surgical intervention at this time  - Recommend updated colonoscopy when this resolves in 6-8 weeks given his last one was 5 years ago.  We will follow with you at this time.     FEN - Soft  VTE - Lovenox ID - [email or web address]   A fib - not on anticoagulation HTN OSA Arthritis  Prostate cancer   LOS: 4 days  I reviewed hospitalist notes, last 24 h vitals and pain scores, last 48 h intake and output, and last 24 h labs and trends.    Adam Phenix, [facility] 01/31/2023, 10:18 AM Please see Amion for pager number during day hours 7:00am-4:30pm

## 2023-01-31 NOTE — Plan of Care (Signed)

## 2023-01-31 NOTE — Progress Notes (Signed)
Pt has home CPAP at bedside and will self administer when he is ready for bed.

## 2023-02-01 DIAGNOSIS — K5792 Diverticulitis of intestine, part unspecified, without perforation or abscess without bleeding: Secondary | ICD-10-CM | POA: Diagnosis not present

## 2023-02-01 LAB — CBC WITH DIFFERENTIAL/PLATELET
Abs Immature Granulocytes: 0.02 10*3/uL (ref 0.00–0.07)
Basophils Absolute: 0 10*3/uL (ref 0.0–0.1)
Basophils Relative: 0 %
Eosinophils Absolute: 0.3 10*3/uL (ref 0.0–0.5)
Eosinophils Relative: 5 %
HCT: 38.6 % — ABNORMAL LOW (ref 39.0–52.0)
Hemoglobin: 12.7 g/dL — ABNORMAL LOW (ref 13.0–17.0)
Immature Granulocytes: 0 %
Lymphocytes Relative: 26 %
Lymphs Abs: 1.7 10*3/uL (ref 0.7–4.0)
MCH: 31.1 pg (ref 26.0–34.0)
MCHC: 32.9 g/dL (ref 30.0–36.0)
MCV: 94.4 fL (ref 80.0–100.0)
Monocytes Absolute: 1 10*3/uL (ref 0.1–1.0)
Monocytes Relative: 15 %
Neutro Abs: 3.7 10*3/uL (ref 1.7–7.7)
Neutrophils Relative %: 54 %
Platelets: 219 10*3/uL (ref 150–400)
RBC: 4.09 MIL/uL — ABNORMAL LOW (ref 4.22–5.81)
RDW: 12.9 % (ref 11.5–15.5)
WBC: 6.8 10*3/uL (ref 4.0–10.5)
nRBC: 0 % (ref 0.0–0.2)

## 2023-02-01 LAB — PHOSPHORUS: Phosphorus: 3.5 mg/dL (ref 2.5–4.6)

## 2023-02-01 LAB — COMPREHENSIVE METABOLIC PANEL
ALT: 41 U/L (ref 0–44)
AST: 47 U/L — ABNORMAL HIGH (ref 15–41)
Albumin: 3.2 g/dL — ABNORMAL LOW (ref 3.5–5.0)
Alkaline Phosphatase: 66 U/L (ref 38–126)
Anion gap: 12 (ref 5–15)
BUN: 14 mg/dL (ref 8–23)
CO2: 27 mmol/L (ref 22–32)
Calcium: 9.1 mg/dL (ref 8.9–10.3)
Chloride: 101 mmol/L (ref 98–111)
Creatinine, Ser: 0.89 mg/dL (ref 0.61–1.24)
GFR, Estimated: >60 mL/min (ref >60)
Glucose, Bld: 111 mg/dL — ABNORMAL HIGH (ref 70–99)
Potassium: 3.5 mmol/L (ref 3.5–5.1)
Sodium: 140 mmol/L (ref 135–145)
Total Bilirubin: 0.6 mg/dL (ref 0.3–1.2)
Total Protein: 6.4 g/dL — ABNORMAL LOW (ref 6.5–8.1)

## 2023-02-01 LAB — MAGNESIUM: Magnesium: 1.9 mg/dL (ref 1.7–2.4)

## 2023-02-01 MED ORDER — AMOXICILLIN-POT CLAVULANATE 875-125 MG PO TABS: 1.0000 | ORAL_TABLET | Freq: Two times a day (BID) | ORAL | 0 refills | Status: AC

## 2023-02-01 NOTE — Discharge Summary (Signed)
Physician Discharge Summary   Adam Glass:[id number] DOB: Jan 17, 1956 DOA: 01/27/2023  PCP: Adam Penton, MD  Admit date: 01/27/2023 Discharge date: 02/01/2023   Admitted From: Home Disposition:  Home Discharging physician: Adam Chamber, MD Barriers to discharge: none  Recommendations at discharge: Follow up with GI in 6 to 8 weeks for colonoscopy  Discharge Condition: stable CODE STATUS: Full Diet recommendation:  Diet Orders (From admission, onward)     Start     Ordered   02/01/23 0000  Diet general        02/01/23 1059            Hospital Course:  Adam Glass is a 67 y.o. male with medical history significant of osteoarthritis, paroxysmal atrial fibrillation, prostate cancer, lumbar spondylosis, constipation, tubular adenoma, depression, hypertension, sleep apnea on CPAP who presented to the emergency department with complaints of abdominal pain. CT abdomen/pelvis showed acute sigmoid diverticulitis with concern for early perforation.  No abscess noted. General surgery was consulted on admission and patient did not warrant any surgical intervention. He was continued on antibiotics at discharge to complete 14-day course.  He was recommended to follow-up with GI in 6 to 8 weeks for updated colonoscopy.   Acute diverticulitis CT abd/pelvis with acute sigmoid diverticulitis with single locule of extraluminal gas along the anterior aspect of the colon, concerning for early perforation  -Patient observed in the hospital and tolerated slow diet advancement -Antibiotics transitioned to Augmentin at discharge to complete 14-day course - outpatient follow up with GI for colonoscopy in 6-8 weeks - patient instructed on return instructions if symptoms recurred or worsened    Back Pain  Recent Bilateral L3 and L4 Medial Branch and L5 Dorsal Ramus Radiofrequency Neurotomy 8/26 PM had worsening L sided lower back pain that seemed musculoskeletal in origin, no numbness,  tingling, weakness (other than that due to pain) -  improved with scheduled tylenol  Diarrhea Suspect due to liquid diet and diverticulitis   Benign essential hypertension Continue home regimen    OSA on CPAP Continue CPAP at bedtime.   Hypokalemia - repleted      The patient's acute and chronic medical conditions were treated accordingly. On day of discharge, patient was felt deemed stable for discharge. Patient/family member advised to call PCP or come back to ER if needed.   Principal Diagnosis: Acute diverticulitis  Discharge Diagnoses: Active Hospital Problems   Diagnosis Date Noted   Acute diverticulitis 01/27/2023   Hypokalemia 01/27/2023   OSA on CPAP 12/11/2015   Benign essential hypertension 12/11/2015    Resolved Hospital Problems  No resolved problems to display.     Discharge Instructions     Diet general   Complete by: As directed    Increase activity slowly   Complete by: As directed       Allergies as of 02/01/2023       Reactions   Shellfish Allergy Shortness Of Breath, Swelling   Throat/tongue swells        Medication List     TAKE these medications    acetaminophen 650 MG CR tablet Commonly known as: TYLENOL Take 1,300 mg by mouth as needed for pain.   amoxicillin-clavulanate 875-125 MG tablet Commonly known as: AUGMENTIN Take 1 tablet by mouth 2 (two) times daily for 9 days.   aspirin EC 81 MG tablet Take 81 mg by mouth daily.   bisoprolol 5 MG tablet Commonly known as: ZEBETA Take 5 mg by mouth every evening.  EPINEPHrine 0.3 mg/0.3 mL Soaj injection Commonly known as: EPI-PEN Inject 0.3 mg into the muscle as needed for anaphylaxis.   fluticasone 50 MCG/ACT nasal spray Commonly known as: FLONASE Place 1 spray into both nostrils daily as needed (allergies.).   hydrALAZINE 50 MG tablet Commonly known as: APRESOLINE Take 50 mg by mouth in the morning and at bedtime.   olmesartan 40 MG tablet Commonly known as:  BENICAR Take 40 mg by mouth every evening.   olopatadine 0.1 % ophthalmic solution Commonly known as: PATANOL Place 1 drop into both eyes daily as needed for allergies (dryness).   potassium chloride 10 MEQ CR capsule Commonly known as: MICRO-K Take 10 mEq by mouth daily.   tadalafil 10 MG tablet Commonly known as: CIALIS 1-2 tabs 1 hour prior to intercourse. What changed:  how much to take how to take this when to take this reasons to take this additional instructions        Follow-up Information     Adam Levee, MD. Call.   Specialties: General Surgery, Colon and Rectal Surgery Why: As needed after follow up colonoscopy if you would like to further discuss elective colon resection. Contact information: [address] [place] [address] [phone]                Allergies  Allergen Reactions   Shellfish Allergy Shortness Of Breath and Swelling    Throat/tongue swells    Consultations: General surgery  Procedures:   Discharge Exam: BP (!) 162/96 (BP Location: Right Arm)   Pulse 68   Temp 98.2 F (36.8 C)   Resp 16   Ht 6\' 4"  (1.93 m)   Wt 92.5 kg   SpO2 91%   BMI 24.83 kg/m  Physical Exam Constitutional:      General: He is not in acute distress.    Appearance: Normal appearance.  HENT:     Head: Normocephalic and atraumatic.     Mouth/Throat:     Mouth: Mucous membranes are moist.  Eyes:     Extraocular Movements: Extraocular movements intact.  Cardiovascular:     Rate and Rhythm: Normal rate and regular rhythm.  Pulmonary:     Effort: Pulmonary effort is normal. No respiratory distress.     Breath sounds: Normal breath sounds. No wheezing.  Abdominal:     General: Bowel sounds are normal. There is no distension.     Palpations: Abdomen is soft.     Tenderness: There is no abdominal tenderness.  Musculoskeletal:        General: Normal range of motion.     Cervical back: Normal range of motion and neck  supple.  Skin:    General: Skin is warm and dry.  Neurological:     General: No focal deficit present.     Mental Status: He is alert.  Psychiatric:        Mood and Affect: Mood normal.        Behavior: Behavior normal.      The results of significant diagnostics from this hospitalization (including imaging, microbiology, ancillary and laboratory) are listed below for reference.   Microbiology: No results found for this or any previous visit (from the past 240 hour(s)).   Labs: BNP (last 3 results) No results for input(s): "BNP" in the last 8760 hours. Basic Metabolic Panel: Recent Labs  Lab 01/27/23 0956 01/28/23 0311 01/29/23 0327 01/30/23 0409 01/31/23 0325 02/01/23 0322  NA 136 137 132* 135 135 140  K 3.3* 3.4* 3.4* 3.0* 4.0 3.5  CL 102 104 101 99 96* 101  CO2 25 24 23 27 27 27   GLUCOSE 113* 119* 94 116* 99 111*  BUN 17 14 13 8 11 14   CREATININE 0.99 0.96 0.88 0.87 0.90 0.89  CALCIUM 8.8* 8.1* 7.9* 8.5* 8.8* 9.1  MG 1.8  --  1.9 1.9 2.0 1.9  PHOS 2.9  --  2.3* 3.3 3.7 3.5   Liver Function Tests: Recent Labs  Lab 01/28/23 0311 01/29/23 0327 01/30/23 0409 01/31/23 0325 02/01/23 0322  AST 16 14* 15 19 47*  ALT 22 18 16 19  41  ALKPHOS 72 65 71 64 66  BILITOT 2.1* 1.8* 1.1 0.7 0.6  PROT 6.3* 5.9* 6.8 6.2* 6.4*  ALBUMIN 3.5 3.1* 3.4* 3.2* 3.2*   Recent Labs  Lab 01/27/23 0956  LIPASE 26   No results for input(s): "AMMONIA" in the last 168 hours. CBC: Recent Labs  Lab 01/27/23 0956 01/28/23 0311 01/29/23 0327 01/30/23 0409 01/31/23 0325 02/01/23 0322  WBC 12.9* 13.5* 9.8 8.5 7.2 6.8  NEUTROABS 9.0*  --   --  5.5 3.7 3.7  HGB 14.5 12.8* 12.1* 13.5 12.5* 12.7*  HCT 42.4 38.9* 37.0* 40.5 38.0* 38.6*  MCV 91.6 93.7 95.4 95.3 95.5 94.4  PLT 216 180 165 182 208 219   Cardiac Enzymes: No results for input(s): "CKTOTAL", "CKMB", "CKMBINDEX", "TROPONINI" in the last 168 hours. BNP: Invalid input(s): "POCBNP" CBG: Recent Labs  Lab 01/31/23 1656   GLUCAP 131*   D-Dimer No results for input(s): "DDIMER" in the last 72 hours. Hgb A1c No results for input(s): "HGBA1C" in the last 72 hours. Lipid Profile No results for input(s): "CHOL", "HDL", "LDLCALC", "TRIG", "CHOLHDL", "LDLDIRECT" in the last 72 hours. Thyroid function studies No results for input(s): "TSH", "T4TOTAL", "T3FREE", "THYROIDAB" in the last 72 hours.  Invalid input(s): "FREET3" Anemia work up No results for input(s): "VITAMINB12", "FOLATE", "FERRITIN", "TIBC", "IRON", "RETICCTPCT" in the last 72 hours. Urinalysis    Component Value Date/Time   COLORURINE YELLOW 01/27/2023 1215   APPEARANCEUR CLEAR 01/27/2023 1215   LABSPEC >1.046 (H) 01/27/2023 1215   PHURINE 6.0 01/27/2023 1215   GLUCOSEU NEGATIVE 01/27/2023 1215   HGBUR SMALL (A) 01/27/2023 1215   BILIRUBINUR NEGATIVE 01/27/2023 1215   KETONESUR NEGATIVE 01/27/2023 1215   PROTEINUR NEGATIVE 01/27/2023 1215   NITRITE NEGATIVE 01/27/2023 1215   LEUKOCYTESUR NEGATIVE 01/27/2023 1215   Sepsis Labs Recent Labs  Lab 01/29/23 0327 01/30/23 0409 01/31/23 0325 02/01/23 0322  WBC 9.8 8.5 7.2 6.8   Microbiology No results found for this or any previous visit (from the past 240 hour(s)).  Procedures/Studies: CT ABDOMEN PELVIS W CONTRAST  Addendum Date: 01/27/2023   ADDENDUM REPORT: 01/27/2023 11:49 ADDENDUM: Critical Value/emergent results were called by telephone at the time of interpretation on 01/27/2023 at 11:47 am to provider Riki Sheer , who verbally acknowledged these results. Electronically Signed   By: Orvan Falconer M.D.   On: 01/27/2023 11:49   Result Date: 01/27/2023 CLINICAL DATA:  Lower abdominal pain.  History of diverticulitis. EXAM: CT ABDOMEN AND PELVIS WITH CONTRAST TECHNIQUE: Multidetector CT imaging of the abdomen and pelvis was performed using the standard protocol following bolus administration of intravenous contrast. RADIATION DOSE REDUCTION: This exam was performed according to the  departmental dose-optimization program which includes automated exposure control, adjustment of the mA and/or kV according to patient size and/or use of iterative reconstruction technique. CONTRAST:  OMNIPAQUE IOHEXOL 300 MG/ML  SOLN  COMPARISON:  CT abdomen/pelvis 02/05/2010. FINDINGS: Lower chest: No acute findings. Hepatobiliary: No focal liver abnormality is seen. No gallstones, gallbladder wall thickening, or biliary dilatation. Pancreas: Unremarkable. No pancreatic ductal dilatation or surrounding inflammatory changes. Spleen: Normal in size without focal abnormality. Adrenals/Urinary Tract: Adrenal glands are unremarkable. Kidneys are normal, without renal calculi, focal lesion, or hydronephrosis. Bladder is unremarkable. Stomach/Bowel: Normal stomach and duodenum. No dilated loops of small bowel. Normal appendix is visualized on axial image 51 series 2. Descending and sigmoid diverticulosis. Focal wall thickening and surrounding inflammation of the proximal sigmoid colon, consistent with acute diverticulitis. Single locule of extraluminal gas along the anterior aspect of the colon (axial image 76 series 2), concerning for early perforation. Vascular/Lymphatic: Aortic atherosclerosis. No enlarged abdominal or pelvic lymph nodes. Reproductive: Prostatomegaly. Other: No abdominal wall hernia or abnormality. No abdominopelvic ascites. Musculoskeletal: No acute or significant osseous findings. IMPRESSION: 1. Acute sigmoid diverticulitis with single locule of extraluminal gas along the anterior aspect of the colon, concerning for early perforation. No drainable abscess. 2. Prostatomegaly. Aortic Atherosclerosis (ICD10-I70.0). Radiology assistant personnel have been notified to put me in telephone contact with the referring physician or the referring physician's clinical representative in order to discuss these findings. Once this communication is established I will issue an addendum to this report for  documentation purposes. Electronically Signed: By: Orvan Falconer M.D. On: 01/27/2023 11:42   CT L-SPINE NO CHARGE  Result Date: 01/27/2023 CLINICAL DATA:  Abdominal pain EXAM: CT LUMBAR SPINE WITHOUT CONTRAST TECHNIQUE: Multidetector CT imaging of the lumbar spine was performed without intravenous contrast administration. Multiplanar CT image reconstructions were also generated. RADIATION DOSE REDUCTION: This exam was performed according to the departmental dose-optimization program which includes automated exposure control, adjustment of the mA and/or kV according to patient size and/or use of iterative reconstruction technique. COMPARISON:  None Available. FINDINGS: Segmentation: 5 lumbar type vertebrae. Alignment: Normal. Vertebrae: No acute fracture or focal pathologic process. Paraspinal and other soft tissues: There is diverticulosis with focal wall thickening in the sigmoid colon (series 2, image 141). Findings can be seen in the setting of diverticulitis. See same day CT abdomen and pelvis for additional findings. Disc levels: Mild multilevel degenerative changes. No evidence of high-grade spinal canal narrowing. There is mild bilateral neural foraminal narrowing at L4-L5. IMPRESSION: 1. No acute fracture or traumatic listhesis of the lumbar spine. 2. Diverticulosis with focal wall thickening in the sigmoid colon. Findings can be seen in the setting of diverticulitis. See same day CT abdomen and pelvis for additional findings. Electronically Signed   By: Lorenza Cambridge M.D.   On: 01/27/2023 11:35     Time coordinating discharge: Over 30 minutes    Adam Chamber, MD  [facility] 02/01/2023, 5:51 PM

## 2023-02-01 NOTE — Progress Notes (Signed)
Progress Note     Subjective: Pain improved, some soreness. Tolerating soft diet and having some diarrhea still. Wants to get home.   Objective: Vital signs in last 24 hours: Temp:  [98 F (36.7 C)-98.4 F (36.9 C)] 98.2 F (36.8 C) (08/28 0604) Pulse Rate:  [57-68] 68 (08/28 0604) Resp:  [16-18] 16 (08/28 0604) BP: (146-163)/(81-96) 162/96 (08/28 0604) SpO2:  [91 %-96 %] 91 % (08/28 0604) Last BM Date : 01/31/23  Intake/Output from previous day: 08/27 0701 - 08/28 0700 In: 1258.6 [I.V.:1258.6] Out: -  Intake/Output this shift: No intake/output data recorded.  PE: General: pleasant, WD, WN male who is laying in bed in NAD Lungs:  Respiratory effort nonlabored Abd: soft, mild ttp in suprapubic abdomen without guarding. no peritonitis, ND MS: all 4 extremities are symmetrical with no cyanosis, clubbing, or edema. Psych: A&Ox3 with an appropriate affect.    Lab Results:  Recent Labs    01/31/23 0325 02/01/23 0322  WBC 7.2 6.8  HGB 12.5* 12.7*  HCT 38.0* 38.6*  PLT 208 219   BMET Recent Labs    01/31/23 0325 02/01/23 0322  NA 135 140  K 4.0 3.5  CL 96* 101  CO2 27 27  GLUCOSE 99 111*  BUN 11 14  CREATININE 0.90 0.89  CALCIUM 8.8* 9.1   PT/INR No results for input(s): "LABPROT", "INR" in the last 72 hours. CMP     Component Value Date/Time   NA 140 02/01/2023 0322   K 3.5 02/01/2023 0322   CL 101 02/01/2023 0322   CO2 27 02/01/2023 0322   GLUCOSE 111 (H) 02/01/2023 0322   BUN 14 02/01/2023 0322   CREATININE 0.89 02/01/2023 0322   CALCIUM 9.1 02/01/2023 0322   PROT 6.4 (L) 02/01/2023 0322   ALBUMIN 3.2 (L) 02/01/2023 0322   AST 47 (H) 02/01/2023 0322   ALT 41 02/01/2023 0322   ALKPHOS 66 02/01/2023 0322   BILITOT 0.6 02/01/2023 0322   GFRNONAA >60 02/01/2023 0322   GFRAA >60 11/06/2018 0902   Lipase     Component Value Date/Time   LIPASE 26 01/27/2023 0956       Studies/Results: No results  found.  Anti-infectives: Anti-infectives (From admission, onward)    Start     Dose/Rate Route Frequency Ordered Stop   01/27/23 2000  piperacillin-tazobactam (ZOSYN) IVPB 3.375 g  Status:  Discontinued        3.375 g 100 mL/hr over 30 Minutes Intravenous Every 8 hours 01/27/23 1220 01/27/23 1222   01/27/23 1800  piperacillin-tazobactam (ZOSYN) IVPB 3.375 g        3.375 g 12.5 mL/hr over 240 Minutes Intravenous Every 8 hours 01/27/23 1223     01/27/23 1200  piperacillin-tazobactam (ZOSYN) IVPB 3.375 g  Status:  Discontinued        3.375 g 100 mL/hr over 30 Minutes Intravenous  Once 01/27/23 1148 01/27/23 1223        Assessment/Plan  Diverticulitis with microperforation - leukocytosis resolved and afebrile, HD stable - pain improving. Having bowel function. Tolerating low fiber diet - no indication for emergent surgical intervention at this time  - Recommend updated colonoscopy when this resolves in 6-8 weeks given his last one was 5 years ago.   - stable for DC from surgical perspective. Ok to DC on PO abx to complete 14 day course.      FEN - Soft  VTE - Lovenox ID - zosyn   A fib - not on anticoagulation HTN  OSA Arthritis  Prostate cancer   LOS: 5 days   I reviewed hospitalist notes, last 24 h vitals and pain scores, last 48 h intake and output, and last 24 h labs and trends.    Juliet Rude, [facility] 02/01/2023, 9:05 AM Please see Amion for pager number during day hours 7:00am-4:30pm

## 2023-02-01 NOTE — Progress Notes (Signed)
D/c instructions reviewed w/ spouse & pt. Both verbalized an understanding No questions at this time. PIV removed as documented. Pt's wife to get car. Pt ambulated to main entrance w/ nurse tech.

## 2023-02-01 NOTE — Progress Notes (Signed)
PT Cancellation Note  Patient Details Name: JIE MCCOIN MRN: [id number] DOB: 1955/07/10   Cancelled Treatment:    Reason Eval/Treat Not Completed: Other (comment). PT to d/c today and no PT needs in current or home setting at this time. Thank you for this referral.   Johnny Bridge, PT Acute Rehab   Jacqualyn Posey 02/01/2023, 1:19 PM

## 2023-02-01 NOTE — Discharge Instructions (Signed)
Follow low fiber diet for 6-8 weeks while recovering from acute diverticulitis flare, then once recovered transition to high fiber diet. Recommend updated colonoscopy in 6-8 weeks and follow up with general surgery as needed if you would like to discuss elective resection.

## 2023-02-02 ENCOUNTER — Telehealth: Payer: Self-pay | Admitting: Gastroenterology

## 2023-02-02 NOTE — Telephone Encounter (Signed)
Inbound call from patient's wife stating patient was recently released from ED due to diverticulitis. Patient has history with [facility]. Would like to transfer care due to ED referring patient and patient recently moved to [facility]. Patient's wife stated she will have patients records sent over for transfer of care.

## 2023-02-14 ENCOUNTER — Telehealth: Payer: Self-pay | Admitting: Gastroenterology

## 2023-02-14 ENCOUNTER — Encounter: Payer: Self-pay | Admitting: Gastroenterology

## 2023-02-14 NOTE — Telephone Encounter (Signed)
Hi Dr. Barron Alvine,   Supervising Provider: 02/14/23-AM  We received a referral for patient to be evaluated for diverticulitis. Was just recently seen at ED for a flare up. The patient does have GI history stated he relocated to [facility] and is seeking to continue his care with [facility]. His records were obtained for you to review and advise on scheduling.    Thank you

## 2023-02-17 ENCOUNTER — Other Ambulatory Visit: Payer: Self-pay | Admitting: Orthopedic Surgery

## 2023-02-23 ENCOUNTER — Inpatient Hospital Stay: Admission: RE | Admit: 2023-02-23 | Payer: Medicare HMO | Source: Ambulatory Visit

## 2023-02-28 ENCOUNTER — Other Ambulatory Visit: Payer: Self-pay | Admitting: Physical Medicine & Rehabilitation

## 2023-02-28 DIAGNOSIS — G8929 Other chronic pain: Secondary | ICD-10-CM

## 2023-03-06 ENCOUNTER — Other Ambulatory Visit: Payer: Self-pay | Admitting: Orthopedic Surgery

## 2023-03-06 ENCOUNTER — Encounter: Payer: Self-pay | Admitting: Urology

## 2023-03-28 ENCOUNTER — Encounter
Admission: RE | Admit: 2023-03-28 | Discharge: 2023-03-28 | Disposition: A | Discharge status: Home / Self Care | Payer: Medicare HMO | Source: Ambulatory Visit | Attending: Orthopedic Surgery | Admitting: Orthopedic Surgery

## 2023-03-28 ENCOUNTER — Other Ambulatory Visit: Payer: Self-pay

## 2023-03-28 VITALS — BP 148/95 | HR 50 | Temp 97.8°F | Resp 18 | Ht 76.0 in | Wt 205.0 lb

## 2023-03-28 DIAGNOSIS — E876 Hypokalemia: Secondary | ICD-10-CM | POA: Insufficient documentation

## 2023-03-28 DIAGNOSIS — Z01818 Encounter for other preprocedural examination: Secondary | ICD-10-CM | POA: Insufficient documentation

## 2023-03-28 DIAGNOSIS — Z22322 Carrier or suspected carrier of Methicillin resistant Staphylococcus aureus: Secondary | ICD-10-CM

## 2023-03-28 DIAGNOSIS — Z01812 Encounter for preprocedural laboratory examination: Secondary | ICD-10-CM

## 2023-03-28 HISTORY — DX: Carrier or suspected carrier of methicillin resistant Staphylococcus aureus: Z22.322

## 2023-03-28 HISTORY — DX: Malignant neoplasm of prostate: C61

## 2023-03-28 HISTORY — DX: Diverticulitis of intestine, part unspecified, without perforation or abscess without bleeding: K57.92

## 2023-03-28 HISTORY — DX: Benign neoplasm, unspecified site: D36.9

## 2023-03-28 HISTORY — DX: Spondylosis without myelopathy or radiculopathy, lumbar region: M47.816

## 2023-03-28 HISTORY — DX: Obstructive sleep apnea (adult) (pediatric): G47.33

## 2023-03-28 LAB — CBC WITH DIFFERENTIAL/PLATELET
Abs Immature Granulocytes: 0.02 10*3/uL (ref 0.00–0.07)
Basophils Absolute: 0.1 10*3/uL (ref 0.0–0.1)
Basophils Relative: 1 %
Eosinophils Absolute: 0.3 10*3/uL (ref 0.0–0.5)
Eosinophils Relative: 4 %
HCT: 42 % (ref 39.0–52.0)
Hemoglobin: 14.3 g/dL (ref 13.0–17.0)
Immature Granulocytes: 0 %
Lymphocytes Relative: 36 %
Lymphs Abs: 3 10*3/uL (ref 0.7–4.0)
MCH: 30.9 pg (ref 26.0–34.0)
MCHC: 34 g/dL (ref 30.0–36.0)
MCV: 90.7 fL (ref 80.0–100.0)
Monocytes Absolute: 0.8 10*3/uL (ref 0.1–1.0)
Monocytes Relative: 10 %
Neutro Abs: 4.1 10*3/uL (ref 1.7–7.7)
Neutrophils Relative %: 49 %
Platelets: 231 10*3/uL (ref 150–400)
RBC: 4.63 MIL/uL (ref 4.22–5.81)
RDW: 12.9 % (ref 11.5–15.5)
WBC: 8.3 10*3/uL (ref 4.0–10.5)
nRBC: 0 % (ref 0.0–0.2)

## 2023-03-28 LAB — URINALYSIS, ROUTINE W REFLEX MICROSCOPIC
Bilirubin Urine: NEGATIVE
Glucose, UA: NEGATIVE mg/dL
Hgb urine dipstick: NEGATIVE
Ketones, ur: NEGATIVE mg/dL
Leukocytes,Ua: NEGATIVE
Nitrite: NEGATIVE
Protein, ur: NEGATIVE mg/dL
Specific Gravity, Urine: 1.023 (ref 1.005–1.030)
pH: 5 (ref 5.0–8.0)

## 2023-03-28 LAB — COMPREHENSIVE METABOLIC PANEL
ALT: 24 U/L (ref 0–44)
AST: 22 U/L (ref 15–41)
Albumin: 4.2 g/dL (ref 3.5–5.0)
Alkaline Phosphatase: 79 U/L (ref 38–126)
Anion gap: 8 (ref 5–15)
BUN: 16 mg/dL (ref 8–23)
CO2: 25 mmol/L (ref 22–32)
Calcium: 9 mg/dL (ref 8.9–10.3)
Chloride: 102 mmol/L (ref 98–111)
Creatinine, Ser: 0.69 mg/dL (ref 0.61–1.24)
GFR, Estimated: >60 mL/min (ref >60)
Glucose, Bld: 97 mg/dL (ref 70–99)
Potassium: 3.4 mmol/L — ABNORMAL LOW (ref 3.5–5.1)
Sodium: 135 mmol/L (ref 135–145)
Total Bilirubin: 0.9 mg/dL (ref 0.3–1.2)
Total Protein: 7.7 g/dL (ref 6.5–8.1)

## 2023-03-28 LAB — SURGICAL PCR SCREEN
MRSA, PCR: POSITIVE — AB
Staphylococcus aureus: POSITIVE — AB

## 2023-03-28 MED ORDER — MUPIROCIN 2 % EX OINT: TOPICAL_OINTMENT | CUTANEOUS | 0 refills | Status: DC

## 2023-03-28 NOTE — Progress Notes (Signed)
  Perioperative Services  Abnormal Lab Notification and Treatment Plan of Care  Date: 03/28/23  Name: Adam Glass MRN:   [id number]  Re: Abnormal labs noted during PAT appointment  Provider Notified: Reinaldo Berber, MD Notification mode: Routed and/or faxed via [facility]  Labs of concern: Lab Results  Component Value Date   STAPHAUREUS POSITIVE (A) 03/28/2023   MRSAPCR POSITIVE (A) 03/28/2023    Notes: Patient is scheduled for a TOTAL KNEE ARTHROPLASTY (Left: Knee) on 04/03/2023. He is scheduled to receive CEFAZOLIN pre-operatively. Pre-surgical PCR (+) for MRSA; see above.  PLANS:  Review renal function. Estimated Creatinine Clearance: 111.5 mL/min (by C-G formula based on SCr of 0.69 mg/dL).  Review allergies. No documented allergy to vancomycin.  Order added for VANCOMYCIN 1 GRAM IV to current preoperative prophylactic regimen.   Patient with orders for both CEFAZOLIN + VANCOMYCIN to be given in the setting of documented MRSA (+) surgical PCR.   Guidelines suggest that a beta-lactam antibiotic (first or second generation cephalosporin) should be added for activity against gram-negative organisms.  Vancomycin appears to be less effective than cefazolin for preventing SSIs caused by MSSA. For this reason, the use of vancomycin in combination with cefazolin is favored for prevention of SSI due to MRSA and coagulase-negative staphylococci.  Medical history in [facility] updated to reflect (+) PCR result indicating nasal MRSA colonization   Rx for additional preoperative nasal decolonization sent to patient's pharmacy of record per MD request.   Meds ordered this encounter  Medications   mupirocin ointment (BACTROBAN) 2 %    Sig: Apply small amount to the inside of both nostrils TWICE a day for the next 5 days.    Dispense:  15 g    Refill:  0   Quentin Mulling, MSN, APRN, FNP-C, CEN [facility]  Perioperative Services Nurse Practitioner Phone: [phone] 03/28/23 4:07 PM

## 2023-03-28 NOTE — Patient Instructions (Addendum)
Your procedure is scheduled on: Monday 04/03/2023  Report to the Registration Desk on the 1st floor of [facility]. To find out your arrival time, please call [phone] between 1PM - 3PM on: Friday 03/31/2023 If your arrival time is 6:00 am, do not arrive before that time as the [facility] entrance doors do not open until 6:00 am.  REMEMBER: Instructions that are not followed completely may result in serious medical risk, up to and including death; or upon the discretion of your surgeon and anesthesiologist your surgery may need to be rescheduled.  Do not eat food after midnight the night before surgery.  No gum chewing or hard candies.  You may however, drink CLEAR liquids up to 2 hours before you are scheduled to arrive for your surgery. Do not drink anything within 2 hours of your scheduled arrival time.  Clear liquids include: - water  - apple juice without pulp - gatorade (not RED colors) - black coffee or tea (Do NOT add milk or creamers to the coffee or tea) Do NOT drink anything that is not on this list.   In addition, your doctor has ordered for you to drink the provided:  Ensure Pre-Surgery Clear Carbohydrate Drink  Drinking this carbohydrate drink up to two hours before surgery helps to reduce insulin resistance and improve patient outcomes. Please complete drinking 2 hours before scheduled arrival time.  One week prior to surgery: Stop Anti-inflammatories (NSAIDS) such as Advil, Aleve, Ibuprofen, Motrin, Naproxen, Naprosyn and Aspirin based products such as Excedrin, Goody's Powder, BC Powder. Stop ANY OVER THE COUNTER supplements until after surgery.  You may however, continue to take Tylenol if needed for pain up until the day of surgery.   Continue taking all of your other prescription medications up until the day of surgery.  ON THE DAY OF SURGERY ONLY TAKE THESE MEDICATIONS WITH SIPS OF WATER:  hydrALAZINE (APRESOLINE) 50 MG    No Alcohol for 24  hours before or after surgery.  No Smoking including e-cigarettes for 24 hours before surgery.  No chewable tobacco products for at least 6 hours before surgery.  No nicotine patches on the day of surgery.  Do not use any "recreational" drugs for at least a week (preferably 2 weeks) before your surgery.  Please be advised that the combination of cocaine and anesthesia may have negative outcomes, up to and including death. If you test positive for cocaine, your surgery will be cancelled.  On the morning of surgery brush your teeth with toothpaste and water, you may rinse your mouth with mouthwash if you wish. Do not swallow any toothpaste or mouthwash.  Use CHG Soap or wipes as directed on instruction sheet.  Do not wear jewelry, make-up, hairpins, clips or nail polish.  For welded (permanent) jewelry: bracelets, anklets, waist bands, etc.  Please have this removed prior to surgery.  If it is not removed, there is a chance that hospital personnel will need to cut it off on the day of surgery.  Do not wear lotions, powders, or perfumes.   Do not shave body hair from the neck down 48 hours before surgery.  Contact lenses, hearing aids and dentures may not be worn into surgery.  Do not bring valuables to the hospital. [facility] is not responsible for any missing/lost belongings or valuables.   Total Shoulder Arthroplasty:  use Benzoyl Peroxide 5% Gel as directed on instruction sheet.  Bring your C-PAP to the hospital in case you may have  to spend the night.   Notify your doctor if there is any change in your medical condition (cold, fever, infection).  Wear comfortable clothing (specific to your surgery type) to the hospital.  After surgery, you can help prevent lung complications by doing breathing exercises.  Take deep breaths and cough every 1-2 hours. Your doctor may order a device called an Incentive Spirometer to help you take deep breaths. When coughing or sneezing, hold a  pillow firmly against your incision with both hands. This is called "splinting." Doing this helps protect your incision. It also decreases belly discomfort.  If you are being admitted to the hospital overnight, leave your suitcase in the car. After surgery it may be brought to your room.  In case of increased patient census, it may be necessary for you, the patient, to continue your postoperative care in the Same Day Surgery department.  If you are being discharged the day of surgery, you will not be allowed to drive home. You will need a responsible individual to drive you home and stay with you for 24 hours after surgery.   If you are taking public transportation, you will need to have a responsible individual with you.  Please call the Pre-admissions Testing Dept. at [phone] if you have any questions about these instructions.  Surgery Visitation Policy:  Patients having surgery or a procedure may have two visitors.  Children under the age of 51 must have an adult with them who is not the patient.  Inpatient Visitation:    Visiting hours are 7 a.m. to 8 p.m. Up to four visitors are allowed at one time in a patient room. The visitors may rotate out with other people during the day.  One visitor age 36 or older may stay with the patient overnight and must be in the room by 8 p.m.    Pre-operative 5 CHG Bath Instructions   You can play a key role in reducing the risk of infection after surgery. Your skin needs to be as free of germs as possible. You can reduce the number of germs on your skin by washing with CHG (chlorhexidine gluconate) soap before surgery. CHG is an antiseptic soap that kills germs and continues to kill germs even after washing.   DO NOT use if you have an allergy to chlorhexidine/CHG or antibacterial soaps. If your skin becomes reddened or irritated, stop using the CHG and notify one of our RNs at [phone].   Please shower with the CHG soap starting 4  days before surgery using the following schedule:     Please keep in mind the following:  DO NOT shave, including legs and underarms, starting the day of your first shower.   You may shave your face at any point before/day of surgery.  Place clean sheets on your bed the day you start using CHG soap. Use a clean washcloth (not used since being washed) for each shower. DO NOT sleep with pets once you start using the CHG.   CHG Shower Instructions:  If you choose to wash your hair and private area, wash first with your normal shampoo/soap.  After you use shampoo/soap, rinse your hair and body thoroughly to remove shampoo/soap residue.  Turn the water OFF and apply about 3 tablespoons (45 ml) of CHG soap to a CLEAN washcloth.  Apply CHG soap ONLY FROM YOUR NECK DOWN TO YOUR TOES (washing for 3-5 minutes)  DO NOT use CHG soap on face, private areas, open wounds, or  sores.  Pay special attention to the area where your surgery is being performed.  If you are having back surgery, having someone wash your back for you may be helpful. Wait 2 minutes after CHG soap is applied, then you may rinse off the CHG soap.  Pat dry with a clean towel  Put on clean clothes/pajamas   If you choose to wear lotion, please use ONLY the CHG-compatible lotions on the back of this paper.     Additional instructions for the day of surgery: DO NOT APPLY any lotions, deodorants, cologne, or perfumes.   Put on clean/comfortable clothes.  Brush your teeth.  Ask your nurse before applying any prescription medications to the skin.      CHG Compatible Lotions   Aveeno Moisturizing lotion  Cetaphil Moisturizing Cream  Cetaphil Moisturizing Lotion  Clairol Herbal Essence Moisturizing Lotion, Dry Skin  Clairol Herbal Essence Moisturizing Lotion, Extra Dry Skin  Clairol Herbal Essence Moisturizing Lotion, Normal Skin  Curel Age Defying Therapeutic Moisturizing Lotion with Alpha Hydroxy  Curel Extreme Care Body  Lotion  Curel Soothing Hands Moisturizing Hand Lotion  Curel Therapeutic Moisturizing Cream, Fragrance-Free  Curel Therapeutic Moisturizing Lotion, Fragrance-Free  Curel Therapeutic Moisturizing Lotion, Original Formula  Eucerin Daily Replenishing Lotion  Eucerin Dry Skin Therapy Plus Alpha Hydroxy Crme  Eucerin Dry Skin Therapy Plus Alpha Hydroxy Lotion  Eucerin Original Crme  Eucerin Original Lotion  Eucerin Plus Crme Eucerin Plus Lotion  Eucerin TriLipid Replenishing Lotion  Keri Anti-Bacterial Hand Lotion  Keri Deep Conditioning Original Lotion Dry Skin Formula Softly Scented  Keri Deep Conditioning Original Lotion, Fragrance Free Sensitive Skin Formula  Keri Lotion Fast Absorbing Fragrance Free Sensitive Skin Formula  Keri Lotion Fast Absorbing Softly Scented Dry Skin Formula  Keri Original Lotion  Keri Skin Renewal Lotion Keri Silky Smooth Lotion  Keri Silky Smooth Sensitive Skin Lotion  Nivea Body Creamy Conditioning Oil  Nivea Body Extra Enriched Lotion  Nivea Body Original Lotion  Nivea Body Sheer Moisturizing Lotion Nivea Crme  Nivea Skin Firming Lotion  NutraDerm 30 Skin Lotion  NutraDerm Skin Lotion  NutraDerm Therapeutic Skin Cream  NutraDerm Therapeutic Skin Lotion  ProShield Protective Hand Cream  Provon moisturizing lotion  How to Use an Incentive Spirometer  An incentive spirometer is a tool that measures how well you are filling your lungs with each breath. Learning to take long, deep breaths using this tool can help you keep your lungs clear and active. This may help to reverse or lessen your chance of developing breathing (pulmonary) problems, especially infection. You may be asked to use a spirometer: After a surgery. If you have a lung problem or a history of smoking. After a long period of time when you have been unable to move or be active. If the spirometer includes an indicator to show the highest number that you have reached, your health care  provider or respiratory therapist will help you set a goal. Keep a log of your progress as told by your health care provider. What are the risks? Breathing too quickly may cause dizziness or cause you to pass out. Take your time so you do not get dizzy or light-headed. If you are in pain, you may need to take pain medicine before doing incentive spirometry. It is harder to take a deep breath if you are having pain. How to use your incentive spirometer  Sit up on the edge of your bed or on a chair. Hold the incentive spirometer so that it  is in an upright position. Before you use the spirometer, breathe out normally. Place the mouthpiece in your mouth. Make sure your lips are closed tightly around it. Breathe in slowly and as deeply as you can through your mouth, causing the piston or the ball to rise toward the top of the chamber. Hold your breath for 3-5 seconds, or for as long as possible. If the spirometer includes a coach indicator, use this to guide you in breathing. Slow down your breathing if the indicator goes above the marked areas. Remove the mouthpiece from your mouth and breathe out normally. The piston or ball will return to the bottom of the chamber. Rest for a few seconds, then repeat the steps 10 or more times. Take your time and take a few normal breaths between deep breaths so that you do not get dizzy or light-headed. Do this every 1-2 hours when you are awake. If the spirometer includes a goal marker to show the highest number you have reached (best effort), use this as a goal to work toward during each repetition. After each set of 10 deep breaths, cough a few times. This will help to make sure that your lungs are clear. If you have an incision on your chest or abdomen from surgery, place a pillow or a rolled-up towel firmly against the incision when you cough. This can help to reduce pain while taking deep breaths and coughing. General tips When you are able to get out of  bed: Walk around often. Continue to take deep breaths and cough in order to clear your lungs. Keep using the incentive spirometer until your health care provider says it is okay to stop using it. If you have been in the hospital, you may be told to keep using the spirometer at home. Contact a health care provider if: You are having difficulty using the spirometer. You have trouble using the spirometer as often as instructed. Your pain medicine is not giving enough relief for you to use the spirometer as told. You have a fever. Get help right away if: You develop shortness of breath. You develop a cough with bloody mucus from the lungs. You have fluid or blood coming from an incision site after you cough. Summary An incentive spirometer is a tool that can help you learn to take long, deep breaths to keep your lungs clear and active. You may be asked to use a spirometer after a surgery, if you have a lung problem or a history of smoking, or if you have been inactive for a long period of time. Use your incentive spirometer as instructed every 1-2 hours while you are awake. If you have an incision on your chest or abdomen, place a pillow or a rolled-up towel firmly against your incision when you cough. This will help to reduce pain. Get help right away if you have shortness of breath, you cough up bloody mucus, or blood comes from your incision when you cough. This information is not intended to replace advice given to you by your health care provider. Make sure you discuss any questions you have with your health care provider. Document Revised: 08/12/2019 Document Reviewed: 08/12/2019 Elsevier Patient Education  2023 [employer].    Please go to the following website to access important education materials concerning your upcoming joint replacement.  [email or web address]

## 2023-03-28 NOTE — Pre-Procedure Instructions (Signed)
Secure chat with surgeon's office and PCP in regards to aspirin 81 mg to continue or stop it. Per surgeon's office ok to continue with it if ok with Anesthesia, secure chat our NP Quentin Mulling and he is ok with that for patient to continue. Called patient and LVM informing above and not to take the morning of procedure.

## 2023-03-30 ENCOUNTER — Ambulatory Visit
Admission: RE | Admit: 2023-03-30 | Discharge: 2023-03-30 | Disposition: A | Discharge status: Home / Self Care | Payer: Medicare HMO | Source: Ambulatory Visit | Attending: Physical Medicine & Rehabilitation | Admitting: Physical Medicine & Rehabilitation

## 2023-03-30 DIAGNOSIS — G8929 Other chronic pain: Secondary | ICD-10-CM

## 2023-04-03 ENCOUNTER — Ambulatory Visit: Payer: Medicare HMO | Admitting: Urgent Care

## 2023-04-03 ENCOUNTER — Other Ambulatory Visit: Payer: Self-pay

## 2023-04-03 ENCOUNTER — Ambulatory Visit: Payer: Medicare HMO

## 2023-04-03 ENCOUNTER — Encounter
Admission: RE | Disposition: A | Discharge status: Home Health | Payer: Self-pay | Source: Ambulatory Visit | Attending: Orthopedic Surgery

## 2023-04-03 ENCOUNTER — Observation Stay
Admission: RE | Admit: 2023-04-03 | Discharge: 2023-04-04 | Disposition: A | Discharge status: Home Health | Payer: Medicare HMO | Source: Ambulatory Visit | Attending: Orthopedic Surgery | Admitting: Orthopedic Surgery

## 2023-04-03 ENCOUNTER — Encounter: Payer: Self-pay | Admitting: Orthopedic Surgery

## 2023-04-03 DIAGNOSIS — Z7982 Long term (current) use of aspirin: Secondary | ICD-10-CM | POA: Diagnosis not present

## 2023-04-03 DIAGNOSIS — Z8546 Personal history of malignant neoplasm of prostate: Secondary | ICD-10-CM | POA: Insufficient documentation

## 2023-04-03 DIAGNOSIS — Z96652 Presence of left artificial knee joint: Principal | ICD-10-CM

## 2023-04-03 DIAGNOSIS — I10 Essential (primary) hypertension: Secondary | ICD-10-CM | POA: Insufficient documentation

## 2023-04-03 DIAGNOSIS — M1712 Unilateral primary osteoarthritis, left knee: Secondary | ICD-10-CM | POA: Diagnosis present

## 2023-04-03 DIAGNOSIS — I4891 Unspecified atrial fibrillation: Secondary | ICD-10-CM | POA: Insufficient documentation

## 2023-04-03 DIAGNOSIS — Z79899 Other long term (current) drug therapy: Secondary | ICD-10-CM | POA: Diagnosis not present

## 2023-04-03 DIAGNOSIS — Z7902 Long term (current) use of antithrombotics/antiplatelets: Secondary | ICD-10-CM | POA: Diagnosis not present

## 2023-04-03 HISTORY — PX: TOTAL KNEE ARTHROPLASTY: SHX125

## 2023-04-03 SURGERY — ARTHROPLASTY, KNEE, TOTAL
Anesthesia: Spinal | Site: Knee | Laterality: Left

## 2023-04-03 MED ORDER — GLYCOPYRROLATE 0.2 MG/ML IJ SOLN
INTRAMUSCULAR | Status: DC | PRN
  Administered 2023-04-03: .2 mg via INTRAVENOUS

## 2023-04-03 MED ORDER — ACETAMINOPHEN 500 MG PO TABS
ORAL_TABLET | ORAL | Status: AC
Start: 2023-04-03 — End: ?
  Filled 2023-04-03: qty 2

## 2023-04-03 MED ORDER — BUPIVACAINE LIPOSOME 1.3 % IJ SUSP
INTRAMUSCULAR | Status: AC
  Filled 2023-04-03: qty 20

## 2023-04-03 MED ORDER — CEFAZOLIN SODIUM-DEXTROSE 2-4 GM/100ML-% IV SOLN
2.0000 g | Freq: Four times a day (QID) | INTRAVENOUS | Status: AC
  Administered 2023-04-03 – 2023-04-04 (×2): 2 g via INTRAVENOUS

## 2023-04-03 MED ORDER — TRAMADOL HCL 50 MG PO TABS
ORAL_TABLET | ORAL | Status: AC
  Filled 2023-04-03: qty 1

## 2023-04-03 MED ORDER — DOCUSATE SODIUM 100 MG PO CAPS
ORAL_CAPSULE | ORAL | Status: AC
  Filled 2023-04-03: qty 1

## 2023-04-03 MED ORDER — OXYCODONE HCL 5 MG PO TABS: 5.0000 mg | ORAL_TABLET | Freq: Once | ORAL | Status: DC | PRN

## 2023-04-03 MED ORDER — BUPIVACAINE HCL (PF) 0.5 % IJ SOLN
INTRAMUSCULAR | Status: DC | PRN
  Administered 2023-04-03: 3 mL

## 2023-04-03 MED ORDER — VANCOMYCIN HCL IN DEXTROSE 1-5 GM/200ML-% IV SOLN
INTRAVENOUS | Status: AC
Start: 2023-04-03 — End: ?
  Filled 2023-04-03: qty 200

## 2023-04-03 MED ORDER — METOCLOPRAMIDE HCL 10 MG PO TABS: 5.0000 mg | ORAL_TABLET | Freq: Three times a day (TID) | ORAL | Status: DC | PRN

## 2023-04-03 MED ORDER — TRAMADOL HCL 50 MG PO TABS
50.0000 mg | ORAL_TABLET | Freq: Four times a day (QID) | ORAL | Status: DC | PRN
  Administered 2023-04-03 – 2023-04-04 (×2): 50 mg via ORAL

## 2023-04-03 MED ORDER — CHLORHEXIDINE GLUCONATE 0.12 % MT SOLN
OROMUCOSAL | Status: AC
  Filled 2023-04-03: qty 15

## 2023-04-03 MED ORDER — ORAL CARE MOUTH RINSE: 15.0000 mL | Freq: Once | OROMUCOSAL | Status: AC

## 2023-04-03 MED ORDER — HYDRALAZINE HCL 50 MG PO TABS
50.0000 mg | ORAL_TABLET | Freq: Two times a day (BID) | ORAL | Status: DC
  Administered 2023-04-03 – 2023-04-04 (×2): 50 mg via ORAL

## 2023-04-03 MED ORDER — ONDANSETRON HCL 4 MG/2ML IJ SOLN: 4.0000 mg | Freq: Four times a day (QID) | INTRAMUSCULAR | Status: DC | PRN

## 2023-04-03 MED ORDER — SODIUM CHLORIDE 0.9 % IV SOLN: INTRAVENOUS | Status: DC

## 2023-04-03 MED ORDER — TRANEXAMIC ACID-NACL 1000-0.7 MG/100ML-% IV SOLN
INTRAVENOUS | Status: AC
Start: 2023-04-03 — End: ?
  Filled 2023-04-03: qty 100

## 2023-04-03 MED ORDER — FENTANYL CITRATE (PF) 100 MCG/2ML IJ SOLN: 25.0000 ug | INTRAMUSCULAR | Status: DC | PRN

## 2023-04-03 MED ORDER — PANTOPRAZOLE SODIUM 40 MG PO TBEC
DELAYED_RELEASE_TABLET | ORAL | Status: AC
  Filled 2023-04-03: qty 1

## 2023-04-03 MED ORDER — CEFAZOLIN SODIUM-DEXTROSE 2-4 GM/100ML-% IV SOLN
INTRAVENOUS | Status: AC
Start: 2023-04-03 — End: ?
  Filled 2023-04-03: qty 100

## 2023-04-03 MED ORDER — METOCLOPRAMIDE HCL 5 MG/ML IJ SOLN: 5.0000 mg | Freq: Three times a day (TID) | INTRAMUSCULAR | Status: DC | PRN

## 2023-04-03 MED ORDER — HYDROCODONE-ACETAMINOPHEN 5-325 MG PO TABS
ORAL_TABLET | ORAL | Status: AC
  Filled 2023-04-03: qty 2

## 2023-04-03 MED ORDER — CEFAZOLIN SODIUM-DEXTROSE 2-4 GM/100ML-% IV SOLN
2.0000 g | INTRAVENOUS | Status: AC
  Administered 2023-04-03: 2 g via INTRAVENOUS

## 2023-04-03 MED ORDER — KETOROLAC TROMETHAMINE 15 MG/ML IJ SOLN
INTRAMUSCULAR | Status: AC
  Filled 2023-04-03: qty 1

## 2023-04-03 MED ORDER — FENTANYL CITRATE (PF) 100 MCG/2ML IJ SOLN
INTRAMUSCULAR | Status: AC
  Filled 2023-04-03: qty 2

## 2023-04-03 MED ORDER — MENTHOL 3 MG MT LOZG
1.0000 | LOZENGE | OROMUCOSAL | Status: DC | PRN
  Filled 2023-04-03: qty 9

## 2023-04-03 MED ORDER — ASPIRIN 81 MG PO TBEC
81.0000 mg | DELAYED_RELEASE_TABLET | Freq: Every day | ORAL | Status: DC
  Administered 2023-04-04: 81 mg via ORAL
  Filled 2023-04-03: qty 1

## 2023-04-03 MED ORDER — BUPIVACAINE-EPINEPHRINE (PF) 0.25% -1:200000 IJ SOLN
INTRAMUSCULAR | Status: AC
  Filled 2023-04-03: qty 30

## 2023-04-03 MED ORDER — DEXAMETHASONE SODIUM PHOSPHATE 10 MG/ML IJ SOLN
8.0000 mg | Freq: Once | INTRAMUSCULAR | Status: AC
  Administered 2023-04-03: 10 mg via INTRAVENOUS

## 2023-04-03 MED ORDER — PROPOFOL 1000 MG/100ML IV EMUL
INTRAVENOUS | Status: AC
  Filled 2023-04-03: qty 200

## 2023-04-03 MED ORDER — IRBESARTAN 150 MG PO TABS
300.0000 mg | ORAL_TABLET | Freq: Every day | ORAL | Status: DC
  Administered 2023-04-03: 300 mg via ORAL
  Filled 2023-04-03 (×2): qty 2

## 2023-04-03 MED ORDER — MIDAZOLAM HCL 2 MG/2ML IJ SOLN
INTRAMUSCULAR | Status: AC
  Filled 2023-04-03: qty 2

## 2023-04-03 MED ORDER — HYDRALAZINE HCL 50 MG PO TABS
ORAL_TABLET | ORAL | Status: AC
  Filled 2023-04-03: qty 1

## 2023-04-03 MED ORDER — LACTATED RINGERS IV SOLN: INTRAVENOUS | Status: DC

## 2023-04-03 MED ORDER — DOCUSATE SODIUM 100 MG PO CAPS
100.0000 mg | ORAL_CAPSULE | Freq: Two times a day (BID) | ORAL | Status: DC
  Administered 2023-04-03: 100 mg via ORAL

## 2023-04-03 MED ORDER — HYDRALAZINE HCL 20 MG/ML IJ SOLN
INTRAMUSCULAR | Status: AC
  Filled 2023-04-03: qty 1

## 2023-04-03 MED ORDER — HYDRALAZINE HCL 20 MG/ML IJ SOLN
10.0000 mg | Freq: Once | INTRAMUSCULAR | Status: AC
  Administered 2023-04-03: 10 mg via INTRAVENOUS
  Filled 2023-04-03: qty 0.5

## 2023-04-03 MED ORDER — ONDANSETRON HCL 4 MG/2ML IJ SOLN
INTRAMUSCULAR | Status: DC | PRN
  Administered 2023-04-03: 4 mg via INTRAVENOUS

## 2023-04-03 MED ORDER — HYDROCODONE-ACETAMINOPHEN 5-325 MG PO TABS
1.0000 | ORAL_TABLET | ORAL | Status: DC | PRN
  Administered 2023-04-03 – 2023-04-04 (×4): 2 via ORAL

## 2023-04-03 MED ORDER — EPHEDRINE SULFATE-NACL 50-0.9 MG/10ML-% IV SOSY
PREFILLED_SYRINGE | INTRAVENOUS | Status: DC | PRN
  Administered 2023-04-03 (×2): 5 mg via INTRAVENOUS

## 2023-04-03 MED ORDER — SURGIPHOR WOUND IRRIGATION SYSTEM - OPTIME: TOPICAL | Status: DC | PRN

## 2023-04-03 MED ORDER — PANTOPRAZOLE SODIUM 40 MG PO TBEC
40.0000 mg | DELAYED_RELEASE_TABLET | Freq: Every day | ORAL | Status: DC
  Administered 2023-04-03 – 2023-04-04 (×2): 40 mg via ORAL

## 2023-04-03 MED ORDER — FENTANYL CITRATE (PF) 100 MCG/2ML IJ SOLN
INTRAMUSCULAR | Status: DC | PRN
  Administered 2023-04-03 (×4): 25 ug via INTRAVENOUS

## 2023-04-03 MED ORDER — CHLORHEXIDINE GLUCONATE 0.12 % MT SOLN
15.0000 mL | Freq: Once | OROMUCOSAL | Status: AC
  Administered 2023-04-03: 15 mL via OROMUCOSAL

## 2023-04-03 MED ORDER — TRANEXAMIC ACID-NACL 1000-0.7 MG/100ML-% IV SOLN
1000.0000 mg | INTRAVENOUS | Status: AC
  Administered 2023-04-03 (×2): 1000 mg via INTRAVENOUS

## 2023-04-03 MED ORDER — PHENYLEPHRINE HCL-NACL 20-0.9 MG/250ML-% IV SOLN
INTRAVENOUS | Status: AC
  Filled 2023-04-03: qty 250

## 2023-04-03 MED ORDER — MORPHINE SULFATE (PF) 4 MG/ML IV SOLN: 0.5000 mg | INTRAVENOUS | Status: DC | PRN

## 2023-04-03 MED ORDER — OXYCODONE HCL 5 MG/5ML PO SOLN
5.0000 mg | Freq: Once | ORAL | Status: DC | PRN
Start: 2023-04-03 — End: 2023-04-03

## 2023-04-03 MED ORDER — BISOPROLOL FUMARATE 5 MG PO TABS
5.0000 mg | ORAL_TABLET | Freq: Every evening | ORAL | Status: DC
  Administered 2023-04-03: 5 mg via ORAL
  Filled 2023-04-03 (×2): qty 1

## 2023-04-03 MED ORDER — MIDAZOLAM HCL 5 MG/5ML IJ SOLN
INTRAMUSCULAR | Status: DC | PRN
  Administered 2023-04-03: 2 mg via INTRAVENOUS

## 2023-04-03 MED ORDER — VANCOMYCIN HCL IN DEXTROSE 1-5 GM/200ML-% IV SOLN
1000.0000 mg | Freq: Once | INTRAVENOUS | Status: AC
  Administered 2023-04-03: 1000 mg via INTRAVENOUS

## 2023-04-03 MED ORDER — ENOXAPARIN SODIUM 30 MG/0.3ML IJ SOSY
30.0000 mg | PREFILLED_SYRINGE | Freq: Two times a day (BID) | INTRAMUSCULAR | Status: DC
  Administered 2023-04-04: 30 mg via SUBCUTANEOUS

## 2023-04-03 MED ORDER — PHENOL 1.4 % MT LIQD: 1.0000 | OROMUCOSAL | Status: DC | PRN

## 2023-04-03 MED ORDER — PROPOFOL 500 MG/50ML IV EMUL
INTRAVENOUS | Status: DC | PRN
  Administered 2023-04-03: 100 ug/kg/min via INTRAVENOUS

## 2023-04-03 MED ORDER — POTASSIUM CHLORIDE CRYS ER 10 MEQ PO TBCR
10.0000 meq | EXTENDED_RELEASE_TABLET | Freq: Every day | ORAL | Status: DC
  Administered 2023-04-03 – 2023-04-04 (×2): 10 meq via ORAL
  Filled 2023-04-03 (×2): qty 1

## 2023-04-03 MED ORDER — ACETAMINOPHEN 500 MG PO TABS
1000.0000 mg | ORAL_TABLET | Freq: Three times a day (TID) | ORAL | Status: DC
  Administered 2023-04-03 – 2023-04-04 (×2): 1000 mg via ORAL

## 2023-04-03 MED ORDER — KETOROLAC TROMETHAMINE 15 MG/ML IJ SOLN
7.5000 mg | Freq: Four times a day (QID) | INTRAMUSCULAR | Status: AC
  Administered 2023-04-03 – 2023-04-04 (×4): 7.5 mg via INTRAVENOUS

## 2023-04-03 MED ORDER — ONDANSETRON HCL 4 MG PO TABS: 4.0000 mg | ORAL_TABLET | Freq: Four times a day (QID) | ORAL | Status: DC | PRN

## 2023-04-03 MED ORDER — TRANEXAMIC ACID-NACL 1000-0.7 MG/100ML-% IV SOLN
INTRAVENOUS | Status: AC
  Filled 2023-04-03: qty 100

## 2023-04-03 MED ORDER — SODIUM CHLORIDE 0.9 % IR SOLN
Status: DC | PRN
  Administered 2023-04-03: 3000 mL

## 2023-04-03 MED ORDER — SODIUM CHLORIDE (PF) 0.9 % IJ SOLN
INTRAMUSCULAR | Status: DC | PRN
  Administered 2023-04-03: 71 mL

## 2023-04-03 MED ORDER — CEFAZOLIN SODIUM-DEXTROSE 2-4 GM/100ML-% IV SOLN
INTRAVENOUS | Status: AC
  Filled 2023-04-03: qty 100

## 2023-04-03 MED ORDER — SODIUM CHLORIDE FLUSH 0.9 % IV SOLN
INTRAVENOUS | Status: AC
  Filled 2023-04-03: qty 20

## 2023-04-03 SURGICAL SUPPLY — 79 items
ADH SKN CLS APL DERMABOND .7 (GAUZE/BANDAGES/DRESSINGS) ×1
APL PRP STRL LF DISP 70% ISPRP (MISCELLANEOUS) ×2
BLADE PATELLA W-PILOT HOLE 35 (BLADE) IMPLANT
BLADE SAGITTAL AGGR TOOTH XLG (BLADE) IMPLANT
BLADE SAW SAG 25X90X1.19 (BLADE) ×1 IMPLANT
BLADE SAW SAG 29X58X.64 (BLADE) ×1 IMPLANT
BOWL CEMENT MIX W/ADAPTER (MISCELLANEOUS) ×1 IMPLANT
BSPLAT TIB 5D H CMNT STM LT (Knees) ×1 IMPLANT
CEMENT BONE R 1X40 (Cement) ×2 IMPLANT
CHLORAPREP W/TINT 26 (MISCELLANEOUS) ×2 IMPLANT
COOLER POLAR GLACIER W/PUMP (MISCELLANEOUS) ×1 IMPLANT
CUFF TOURN SGL QUICK 24 (TOURNIQUET CUFF) ×1
CUFF TOURN SGL QUICK 30 (TOURNIQUET CUFF)
CUFF TRNQT CYL 24X4X16.5-23 (TOURNIQUET CUFF) IMPLANT
CUFF TRNQT CYL 30X4X21-28X (TOURNIQUET CUFF) IMPLANT
DERMABOND ADVANCED .7 DNX12 (GAUZE/BANDAGES/DRESSINGS) ×1 IMPLANT
DRAPE INCISE IOBAN 66X60 STRL (DRAPES) IMPLANT
DRAPE SHEET LG 3/4 BI-LAMINATE (DRAPES) ×1 IMPLANT
DRSG MEPILEX SACRM 8.7X9.8 (GAUZE/BANDAGES/DRESSINGS) ×1 IMPLANT
DRSG OPSITE POSTOP 4X10 (GAUZE/BANDAGES/DRESSINGS) IMPLANT
DRSG OPSITE POSTOP 4X8 (GAUZE/BANDAGES/DRESSINGS) IMPLANT
ELECT REM PT RETURN 9FT ADLT (ELECTROSURGICAL) ×1
ELECTRODE REM PT RTRN 9FT ADLT (ELECTROSURGICAL) ×1 IMPLANT
GLOVE BIO SURGEON STRL SZ8 (GLOVE) ×1 IMPLANT
GLOVE BIOGEL PI IND STRL 8 (GLOVE) ×1 IMPLANT
GLOVE PI ORTHO PRO STRL 7.5 (GLOVE) ×2 IMPLANT
GLOVE PI ORTHO PRO STRL SZ8 (GLOVE) ×2 IMPLANT
GLOVE SURG SYN 7.5 E (GLOVE) ×1
GLOVE SURG SYN 7.5 PF PI (GLOVE) ×1 IMPLANT
GOWN SRG XL LVL 3 NONREINFORCE (GOWNS) ×1 IMPLANT
GOWN STRL NON-REIN TWL XL LVL3 (GOWNS) ×1
GOWN STRL REUS W/ TWL LRG LVL3 (GOWN DISPOSABLE) ×1 IMPLANT
GOWN STRL REUS W/ TWL XL LVL3 (GOWN DISPOSABLE) ×1 IMPLANT
GOWN STRL REUS W/TWL LRG LVL3 (GOWN DISPOSABLE) ×1
GOWN STRL REUS W/TWL XL LVL3 (GOWN DISPOSABLE) ×1
HANDLE YANKAUER SUCT OPEN TIP (MISCELLANEOUS) ×1 IMPLANT
HOOD PEEL AWAY T7 (MISCELLANEOUS) ×2 IMPLANT
INSERT COMP PS 8-11 GH LT (Insert) IMPLANT
IV NS IRRIG 3000ML ARTHROMATIC (IV SOLUTION) ×1 IMPLANT
KIT TURNOVER KIT A (KITS) ×1 IMPLANT
MANIFOLD NEPTUNE II (INSTRUMENTS) ×1 IMPLANT
MARKER SKIN DUAL TIP RULER LAB (MISCELLANEOUS) ×1 IMPLANT
MAT ABSORB FLUID 56X50 GRAY (MISCELLANEOUS) ×1 IMPLANT
NDL FILTER BLUNT 18X1 1/2 (NEEDLE) ×1 IMPLANT
NDL HYPO 21X1.5 SAFETY (NEEDLE) ×1 IMPLANT
NDL SAFETY ECLIPSE 18X1.5 (NEEDLE) ×1 IMPLANT
NEEDLE FILTER BLUNT 18X1 1/2 (NEEDLE) ×1
NEEDLE HYPO 21X1.5 SAFETY (NEEDLE) ×1
PACK TOTAL KNEE (MISCELLANEOUS) ×1 IMPLANT
PAD ARMBOARD 7.5X6 YLW CONV (MISCELLANEOUS) ×3 IMPLANT
PAD WRAPON POLAR KNEE (MISCELLANEOUS) ×1 IMPLANT
PENCIL SMOKE EVACUATOR (MISCELLANEOUS) ×1 IMPLANT
PIN DRILL HDLS TROCAR 75 4PK (PIN) IMPLANT
PSN FEM CR CMT CCR STD SZ10 L (Joint) ×1 IMPLANT
PULSAVAC PLUS IRRIG FAN TIP (DISPOSABLE) ×1
Patella Reamer Blade with Pilot Hole IMPLANT
SCREW FEMALE HEX FIX 25X2.5 (ORTHOPEDIC DISPOSABLE SUPPLIES) IMPLANT
SCREW HEX HEADED 3.5X27 DISP (ORTHOPEDIC DISPOSABLE SUPPLIES) IMPLANT
SLEEVE SCD COMPRESS KNEE MED (STOCKING) ×1 IMPLANT
SOLUTION IRRIG SURGIPHOR (IV SOLUTION) ×1 IMPLANT
STEM POLY PAT PLY 35M KNEE (Knees) IMPLANT
STEM TIB ST PERS 14+30 (Stem) IMPLANT
STEM TIBIA 5 DEG SZ H L KNEE (Knees) IMPLANT
SURFACE ARTC PRSNA CCR SZ10 L (Joint) IMPLANT
SUT DVC 2 QUILL PDO T11 36X36 (SUTURE) ×1 IMPLANT
SUT QUILL MONODERM 3-0 PS-2 (SUTURE) ×1 IMPLANT
SUT VIC AB 0 CT1 36 (SUTURE) ×1 IMPLANT
SUT VIC AB 2-0 CT2 27 (SUTURE) ×2 IMPLANT
SUT VICRYL 1-0 27IN ABS (SUTURE) ×1
SUTURE VICRYL 1-0 27IN ABS (SUTURE) ×1 IMPLANT
SYR 30ML LL (SYRINGE) ×2 IMPLANT
SYR TB 1ML LL NO SAFETY (SYRINGE) ×1 IMPLANT
TAPE CLOTH 3X10 WHT NS LF (GAUZE/BANDAGES/DRESSINGS) ×1 IMPLANT
TIBIA STEM 5 DEG SZ H L KNEE (Knees) ×1 IMPLANT
TIP FAN IRRIG PULSAVAC PLUS (DISPOSABLE) ×1 IMPLANT
TOWEL OR 17X26 4PK STRL BLUE (TOWEL DISPOSABLE) IMPLANT
TRAP FLUID SMOKE EVACUATOR (MISCELLANEOUS) ×1 IMPLANT
WATER STERILE IRR 1000ML POUR (IV SOLUTION) ×1 IMPLANT
WRAPON POLAR PAD KNEE (MISCELLANEOUS) ×1

## 2023-04-03 NOTE — Anesthesia Procedure Notes (Signed)
Spinal  Patient location during procedure: OR Start time: 04/03/2023 10:28 AM End time: 04/03/2023 10:28 AM Reason for block: surgical anesthesia Staffing Performed: resident/CRNA  Anesthesiologist: Stephanie Coup, MD Resident/CRNA: Maryla Morrow., CRNA Performed by: Maryla Morrow., CRNA Authorized by: Stephanie Coup, MD   Preanesthetic Checklist Completed: patient identified, IV checked, site marked, risks and benefits discussed, surgical consent, monitors and equipment checked, pre-op evaluation and timeout performed Spinal Block Patient position: sitting Prep: Betadine Patient monitoring: heart rate, continuous pulse ox, blood pressure and cardiac monitor Approach: midline Location: L4-5 Injection technique: single-shot Needle Needle type: Whitacre and Introducer  Needle gauge: 24 G Needle length: 9 cm Assessment Events: CSF return Additional Notes Negative paresthesia. Negative blood return. Positive free-flowing CSF. Expiration date of kit checked and confirmed. Patient tolerated procedure well, without complications.

## 2023-04-03 NOTE — Interval H&P Note (Signed)
Patient history and physical updated. Consent reviewed including risks, benefits, and alternatives to surgery. Patient agrees with above plan to proceed with left total knee arthroplasty.

## 2023-04-03 NOTE — Transfer of Care (Signed)
Immediate Anesthesia Transfer of Care Note  Patient: Adam Glass  Procedure(s) Performed: TOTAL KNEE ARTHROPLASTY (Left: Knee)  Patient Location: PACU  Anesthesia Type:General  Level of Consciousness: awake, alert , and oriented  Airway & Oxygen Therapy: Patient Spontanous Breathing  Post-op Assessment: Report given to RN and Post -op Vital signs reviewed and stable  Post vital signs: stable  Last Vitals:  Vitals Value Taken Time  BP 131/71 04/03/23 1245  Temp    Pulse 59 04/03/23 1257  Resp 15 04/03/23 1257  SpO2 95 % 04/03/23 1257  Vitals shown include unfiled device data.  Last Pain:  Vitals:   04/03/23 0815  TempSrc: Temporal  PainSc: 4          Complications: No notable events documented.

## 2023-04-03 NOTE — H&P (Signed)
History of Present Illness: The patient is an 67 y.o. male seen in clinic today for follow-up evaluation of his left knee prior to planned left total knee replacement. The patient reports he is had good improvement in his diverticulitis and is back to a normal diet and having minimal upset stomach's. He continues to have severe pain in his left knee and feels that he is medically ready to move forward with surgery. He reports the pain is significantly limiting his pain and function with episodes of giving out of the knee. Overall he is improved and is functioning limited by his knee and ready to move forward with knee replacement. The patient denies fevers, chills, numbness, tingling, shortness of breath, chest pain, recent illness, or any trauma.  Patient is a non-smoker with a BMI of 25.5 and hemoglobin A1c of 5.4  Past Medical History: Past Medical History:  Diagnosis Date  Allergy  Arthritis  Atrial fibrillation (CMS/HHS-HCC)  Depression  Diverticulosis  Hypertension  Prostate cancer (CMS/HHS-HCC)  Sleep apnea   Past Surgical History: Past Surgical History:  Procedure Laterality Date  COLONOSCOPY 03/30/1999  Hyperplastic Polyps  COLONOSCOPY 03/28/2008  Int Hemorrhoids, Diverticulosis: CBF 03/2018  BIOPSY PROSTATE NEEDLE/PUNCH 04/2017  COLONOSCOPY 10/2017  COLONOSCOPY 10/20/2017  Adenomatous Polyps: CBF 10/2019  Left knee arthroscopy, partial lateral meniscectomy, and chondroplasty 11/09/2018  Dr Ernest Pine  COLONOSCOPY 03/06/2020  Tubular adenomas/PHx CP/Repeat 45yrs/TKT  ARTHROSCOPIC ROTATOR CUFF REPAIR Left  KNEE ARTHROSCOPY  Left knee arthrotomy and lateral meniscectomy  VASECTOMY   Past Family History: Family History  Problem Relation Age of Onset  Leukemia Father  Myocardial Infarction (Heart attack) Father  Heart disease Father  Diabetes type II Mother  High blood pressure (Hypertension) Mother  Diabetes type II Brother  High blood pressure (Hypertension) Brother   Diabetes type II Maternal Grandmother   Medications: Current Outpatient Medications  Medication Sig Dispense Refill  acetaminophen (TYLENOL) 650 MG ER tablet Take 650 mg by mouth every 8 (eight) hours as needed  aspirin 81 MG EC tablet Take 81 mg by mouth once daily.  bisoprolol (ZEBETA) 5 MG tablet Take 1 tablet (5 mg total) by mouth once daily 90 tablet 3  fluticasone propionate (FLONASE) 50 mcg/actuation nasal spray Place 1 spray into both nostrils once daily as needed  hydrALAZINE (APRESOLINE) 50 MG tablet Take 1 tablet (50 mg total) by mouth 2 (two) times daily 180 tablet 3  olmesartan (BENICAR) 40 MG tablet Take 1 tablet (40 mg total) by mouth once daily 90 tablet 3  olopatadine (PATANOL) 0.1 % ophthalmic solution Place 1 drop into both eyes 2 (two) times daily  potassium chloride (MICRO-K) 10 MEQ ER capsule Take 1 capsule (10 mEq total) by mouth once daily 90 capsule 3   No current facility-administered medications for this visit.   Allergies: Allergies  Allergen Reactions  Shellfish Containing Products Shortness Of Breath and Swelling    Visit Vitals: Vitals:  03/28/23 0955  BP: 130/86    Review of Systems:  A comprehensive 14 point ROS was performed, reviewed, and the pertinent orthopaedic findings are documented in the HPI.  Physical Exam: General/Constitutional: No apparent distress: well-nourished and well developed. Eyes: Pupils equal, round with synchronous movement. Pulmonary exam: Lungs clear to auscultation bilaterally no wheezing rales or rhonchi Cardiac exam: Regular rate and rhythm no obvious murmurs rubs or gallops. Integumentary: No impressive skin lesions present, except as noted in detailed exam. Neuro/Psych: Normal mood and affect, oriented to person, place and time.  Comprehensive Knee Exam:  Gait Non-antalgic and fluid  Alignment Left knee in valgus relative to right   Inspection Right Left  Skin Normal appearance with no obvious deformity. No  ecchymosis or erythema. Normal appearance with no obvious deformity. Healed arthroscopic portals and healed old surgical incision laterally. no ecchymosis or erythema.  Soft Tissue No focal soft tissue swelling No focal soft tissue swelling  Quad Atrophy None None   Palpation  Right Left  Tenderness No peripatellar, patellar tendon, quad tendon, medial/lateral joint line pain Lateral joint line tenderness palpation  Crepitus No patellofemoral or tibiofemoral crepitus + patellofemoral and tibiofemoral crepitus  Effusion None None   Range of Motion Right Left  Flexion 0-125 0-120  Extension Full knee extension without hyperextension Full knee extension without hyperextension   Ligamentous Exam Right Left  Lachman Normal Normal  Valgus 0 Normal Normal  Valgus 30 Normal Normal  Varus 0 Normal 2+ partially correctable deformity with endpoint  Varus 30 Normal Normal  Anterior Drawer Normal Normal  Posterior Drawer Normal Normal   Meniscal Exam Right Left  Hyperflexion Test Negative Positive  Hyperextension Test Negative Negative  McMurray's Negative Positive   Neurovascular Right Left  Quadriceps Strength 5/5 5/5  Hamstring Strength 5/5 5/5  Hip Abductor Strength 4/5 4/5  Distal Motor Normal Normal  Distal Sensory Normal light touch sensation Normal light touch sensation  Distal Pulses Normal Normal    Imaging Studies: I reviewed AP, lateral, sunrise, and flexed PA weightbearing x-rays (4 views) of the left knee taken at a previous office visit reviewed by myself. There is severe degenerative changes of the left knee with lateral bone-on-bone articulation, osteophyte formation, sclerosis, and subchondral cyst formation as well as patellofemoral joint space narrowing. Kellgren-Lawrence grade 4. AP, sunrise, and flexed PA x-rays of the right knee show some mild degenerative changes with some medial joint space narrowing and proximal tibial sclerosis. There is also some  patellofemoral degeneration with osteophytes. Kellgren-Lawrence grade 2/3. No fractures dislocations noted in either knee.   Assessment:  Left knee osteoarthritis  Plan: Adam Glass is a 67 year old male who presents with left knee bone on bone arthritis. Based upon the patient's continued symptoms and failure to respond to conservative treatment, I have recommended a left total knee replacement for this patient. A long discussion took place with the patient describing what a total joint replacement is and what the procedure would entail. A knee model, similar to the implants that will be used during the operation, was utilized to demonstrate the implants. Choices of implant manufactures were discussed and reviewed. The ability to secure the implant utilizing cement or cementless (press fit) fixation was discussed. The approach and exposure was discussed.   The hospitalization and post-operative care and rehabilitation were also discussed. The use of perioperative antibiotics and DVT prophylaxis were discussed. The risk, benefits and alternatives to a surgical intervention were discussed at length with the patient. The patient was also advised of risks related to the medical comorbidities and elevated body mass index (BMI). A lengthy discussion took place to review the most common complications including but not limited to: stiffness, loss of function, complex regional pain syndrome, deep vein thrombosis, pulmonary embolus, heart attack, stroke, infection, wound breakdown, numbness, intraoperative fracture, damage to nerves, tendon,muscles, arteries or other blood vessels, death and other possible complications from anesthesia. The patient was told that we will take steps to minimize these risks by using sterile technique, antibiotics and DVT prophylaxis when appropriate and follow the patient postoperatively in the office setting  to monitor progress. The possibility of recurrent pain, no improvement in pain and  actual worsening of pain were also discussed with the patient.   The discharge plan of care focused on the patient going home following surgery. The patient was encouraged to make the necessary arrangements to have someone stay with them when they are discharged home.   The benefits of surgery were discussed with the patient including the potential for improving the patient's current clinical condition through operative intervention. Alternatives to surgical intervention including continued conservative management were also discussed in detail. All questions were answered to the satisfaction of the patient. The patient participated and agreed to the plan of care as well as the use of the recommended implants for their total knee replacement surgery. An information packet was given to the patient to review prior to surgery.   Received medical clearance for surgery. All questions answered patient agrees above plan to make preparations for left total knee replacement.    Portions of this record have been created using Scientist, clinical (histocompatibility and immunogenetics). Dictation errors have been sought, but may not have been identified and corrected.  Reinaldo Berber MD

## 2023-04-03 NOTE — Anesthesia Preprocedure Evaluation (Signed)
Anesthesia Evaluation  Patient identified by MRN, date of birth, ID band Patient awake    Reviewed: Allergy & Precautions, H&P , NPO status , Patient's Chart, lab work & pertinent test results, reviewed documented beta blocker date and time   History of Anesthesia Complications Negative for: history of anesthetic complications  Airway Mallampati: II  TM Distance: >3 FB Neck ROM: full    Dental  (+) Dental Advidsory Given, Caps, Teeth Intact   Pulmonary neg shortness of breath, sleep apnea and Continuous Positive Airway Pressure Ventilation , neg COPD, neg recent URI          Cardiovascular Exercise Tolerance: Good hypertension, (-) angina (-) CAD, (-) Past MI, (-) Cardiac Stents and (-) CABG + dysrhythmias Atrial Fibrillation (-) Valvular Problems/Murmurs     Neuro/Psych  PSYCHIATRIC DISORDERS  Depression    negative neurological ROS     GI/Hepatic negative GI ROS, Neg liver ROS,,,  Endo/Other  negative endocrine ROS    Renal/GU      Musculoskeletal   Abdominal   Peds  Hematology negative hematology ROS (+)   Anesthesia Other Findings Past Medical History: No date: Change in bowel habits No date: Constipation No date: Hypertension No date: Sleep apnea   Reproductive/Obstetrics negative OB ROS                             Anesthesia Physical Anesthesia Plan  ASA: 2  Anesthesia Plan: General/Spinal   Post-op Pain Management:    Induction:   PONV Risk Score and Plan: 1 and Propofol infusion, TIVA, Midazolam and Ondansetron  Airway Management Planned: Natural Airway and Nasal Cannula  Additional Equipment:   Intra-op Plan:   Post-operative Plan:   Informed Consent: I have reviewed the patients History and Physical, chart, labs and discussed the procedure including the risks, benefits and alternatives for the proposed anesthesia with the patient or authorized representative who  has indicated his/her understanding and acceptance.     Dental Advisory Given  Plan Discussed with: Anesthesiologist, CRNA and Surgeon  Anesthesia Plan Comments: (Patient reports no bleeding problems and no anticoagulant use.  Plan for spinal with backup GA  Patient consented for risks of anesthesia including but not limited to:  - adverse reactions to medications - damage to eyes, teeth, lips or other oral mucosa - nerve damage due to positioning  - risk of bleeding, infection and or nerve damage from spinal that could lead to paralysis - risk of headache or failed spinal - damage to teeth, lips or other oral mucosa - sore throat or hoarseness - damage to heart, brain, nerves, lungs, other parts of body or loss of life  Patient voiced understanding and assent.)       Anesthesia Quick Evaluation

## 2023-04-03 NOTE — Op Note (Signed)
Patient Name: Adam Glass  ZOX:[id number]  Pre-Operative Diagnosis: Left knee Osteoarthritis  Post-Operative Diagnosis: (same)  Procedure: Left Total Knee Arthroplasty  Components/Implants: Femur: Persona Size 10 CR   Tibia: Persona Size H w/ 14x31mm stem extension  Poly: 11mm MC  Patella: 35x26mm symmetric  Femoral Valgus Cut Angle: 5 degrees  Distal Femoral Re-cut: none  Patella Resurfacing: yes   Date of Surgery: 04/03/2023  Surgeon: Reinaldo Berber MD  Assistant: Amador Cunas PA (present and scrubbed throughout the case, critical for assistance with exposure, retraction, instrumentation, and closure), Minor PAS   Anesthesiologist: Lorette Ang  Anesthesia: Spinal   Tourniquet Time: 70 min  EBL: 50cc  IVF: 600cc  Complications: None   Brief history: The patient is a 67 year old male with a history of osteoarthritis of the left knee with pain limiting their range of motion and activities of daily living, which has failed multiple attempts at conservative therapy.  The risks and benefits of total knee arthroplasty as definitive surgical treatment were discussed with the patient, who opted to proceed with the operation.  After outpatient medical clearance and optimization was completed the patient was admitted to [facility] for the procedure.  All preoperative films were reviewed and an appropriate surgical plan was made prior to surgery. Preoperative range of motion was 0 to 120. The patient was identified as having a Valgus alignment.   Description of procedure: The patient was brought to the operating room where laterality was confirmed by all those present to be the left side.   Spinal anesthesia was administered and the patient received an intravenous dose of antibiotics for surgical prophylaxis and a dose of tranexamic acid.  Patient is positioned supine on the operating room table with all bony prominences well-padded.  A well-padded tourniquet was  applied to the left thigh.  The knee was then prepped and draped in usual sterile fashion with multiple layers of adhesive and nonadhesive drapes.  All of those present in the operating room participated in a surgical timeout laterality and patient were confirmed.   An Esmarch was wrapped around the extremity and the leg was elevated and the knee flexed.  The tourniquet was inflated to a pressure of 275 mmHg. The Esmarch was removed and the leg was brought down to full extension.  The patella and tibial tubercle identified and outlined using a marking pen and a midline skin incision was made with a knife carried through the subcutaneous tissue down to the extensor retinaculum.  After exposure of the extensor mechanism the medial parapatellar arthrotomy was performed with a scalpel and electrocautery extending down medial and distal to the tibial tubercle taking care to avoid incising the patellar tendon.   A standard medial release was performed over the proximal tibia.  The knee was brought into extension in order to excise the fat pad taking care not to damage the patella tendon.  The superior soft tissue was removed from the anterior surface of the distal femur to visualize for the procedure.  The knee was then brought into flexion with the patella subluxed laterally and subluxing the tibia anteriorly.  The ACL was transected and removed with electrocautery and additional soft tissue was removed from the proximal surface of the tibia to fully expose. The PCL was found to be intact and was preserved.  An extramedullary tibial cutting guide was then applied to the leg with a spring-loaded ankle clamp placed around the distal tibia just above the malleoli the angulation of the guide was  adjusted to give some posterior slope in the tibial resection with an appropriate varus/valgus alignment.  The resection guide was then pinned to the proximal tibia and the proximal tibial surface was resected with an  oscillating saw.  Careful attention was paid to ensure the blade did not disrupt any of the soft tissues including any lateral or medial ligament.  Attention was then turned to the femur, with the knee slightly flexed a opening drill was used to enter the medullary canal of the femur.  After removing the drill marrow was suctioned out to decompress the distal femur.  An intramedullary femoral guide was then inserted into the drill hole and the alignment guide was seated firmly against the distal end of the medial femoral condyle.  The distal femoral cutting guide was then attached and pinned securely to the anterior surface of the femur and the intramedullary rod and alignment guide was removed.  Distal femur resection was then performed with an oscillating saw with retractors protecting medial and laterally.   The distal cutting block was then removed and the extension gap was checked with a spacer.  Extension gap was found to be appropriately sized to accommodate the spacer block.   The femoral sizing guide was then placed securely into the posterior condyles of the femur and the femoral size was measured and determined to be 10.  The size 10; 4-in-1 cutting guide was placed in position and secured with 2 pins.  The anterior posterior and chamfer resections were then performed with an oscillating saw.  Bony fragments and osteophytes were then removed.  Using a lamina spreader the posterior medial and lateral condyles were checked for additional osteophytes and posterior soft tissue remnants.  Any remaining meniscus was removed at this time.  Periarticular injection was performed in the meniscal rims and posterior capsule with aspiration performed to ensure no intravascular injection.   The tibia was then exposed and the tibial trial was pinned onto the plateau after confirming appropriate orientation and rotation.  Using the drill bushing the tibia was prepared to the appropriate drill depth.  Tibial  broach impactor was then driven through the punch guide using a mallet.  The femoral trial component was then inserted onto the femur. A trial tibial polyethylene bearing was then placed and the knee was reduced.  The knee achieved full extension with no hyperextension and was found to be balanced in flexion and extension with the trials in place.  The knee was then brought into full extension the patella was everted and held with 2 Kocher clamps.  The articular surface of the patella was then resected with an patella reamer and saw after careful measurement with a caliper.  The patella was then prepared with the drill guide and a trial patella was placed.  The knee was then taken through range of motion and it was found that the patella articulated appropriately with the trochlea and good patellofemoral motion without subluxation.    The correct final components for implantation were confirmed and opened by the circulator nurse.  The prepared surfaces of the patella femur and tibia were cleaned with pulsatile lavage to remove all blood fat and other material and then the surfaces were dried.  2 bags of cement were mixed under vacuum and the components were cemented into place.  Excess cement was removed with curettes and forceps. A trial polyethylene tibial component was placed and the knee was brought into extension to allow the cement to set.  At this  time the periarticular injection cocktail was placed in the soft tissues surrounding the knee.  After full curing of the cement the balance of the knee was checked again and the final polyethylene size was confirmed. The tibial component was irrigated and locking mechanism checked to ensure it was clear of debris. The real polyethylene tibial component was implanted and the knee was brought through a range of motion.   The knee was then irrigated with copious amount of normal saline via pulsatile lavage to remove all loose bodies and other debris.  The knee was  then irrigated with surgiphor betadine based wash and reirrigated with saline.  The tourniquet was then dropped and all bleeding vessels were identified and coagulated.  The arthrotomy was approximated with #1 Vicryl and closed with #2 Quill suture.  The knee was brought into slight flexion and the subcutaneous tissues were closed with 0 Vicryl, 2-0 Vicryl and a running subcuticular 3-0 monoderm quil suture.  Skin was then glued with Dermabond.  A sterile adhesive dressing was then placed along with a sequential compression device to the calf, a Ted stocking, and a cryotherapy cuff.   Sponge, needle, and Lap counts were all correct at the end of the case.   The patient was transferred off of the operating room table to a hospital bed, good pulses were found distally on the operative side.  The patient was transferred to the recovery room in stable condition.

## 2023-04-03 NOTE — Progress Notes (Signed)
Patient c/o abdominal pain/discomfort. Patient's bladder was scanned showing 715 cc, patient was in and out cath with an output of 820 cc. Patient reports abdomen feeling better. This RN noticed patient urine was red with blood clot at the end of catheter. Pt reports hx of "prostate problems." Patient was previously in and out cath and previous RN reports urine was clear and yellow. Dr. Joelene Millin notified of patient urine change. No new orders given. Patient tolerated procedure well. Patient denies any discomfort during in and out cath.

## 2023-04-04 ENCOUNTER — Encounter: Payer: Self-pay | Admitting: Orthopedic Surgery

## 2023-04-04 DIAGNOSIS — M1712 Unilateral primary osteoarthritis, left knee: Secondary | ICD-10-CM | POA: Diagnosis not present

## 2023-04-04 LAB — CBC
HCT: 38.4 % — ABNORMAL LOW (ref 39.0–52.0)
Hemoglobin: 13.4 g/dL (ref 13.0–17.0)
MCH: 31.2 pg (ref 26.0–34.0)
MCHC: 34.9 g/dL (ref 30.0–36.0)
MCV: 89.5 fL (ref 80.0–100.0)
Platelets: 239 10*3/uL (ref 150–400)
RBC: 4.29 MIL/uL (ref 4.22–5.81)
RDW: 13.2 % (ref 11.5–15.5)
WBC: 20.6 10*3/uL — ABNORMAL HIGH (ref 4.0–10.5)
nRBC: 0 % (ref 0.0–0.2)

## 2023-04-04 LAB — BASIC METABOLIC PANEL
Anion gap: 9 (ref 5–15)
BUN: 20 mg/dL (ref 8–23)
CO2: 25 mmol/L (ref 22–32)
Calcium: 8.8 mg/dL — ABNORMAL LOW (ref 8.9–10.3)
Chloride: 103 mmol/L (ref 98–111)
Creatinine, Ser: 1.03 mg/dL (ref 0.61–1.24)
GFR, Estimated: >60 mL/min (ref >60)
Glucose, Bld: 134 mg/dL — ABNORMAL HIGH (ref 70–99)
Potassium: 3.7 mmol/L (ref 3.5–5.1)
Sodium: 137 mmol/L (ref 135–145)

## 2023-04-04 MED ORDER — HYDROCODONE-ACETAMINOPHEN 5-325 MG PO TABS
ORAL_TABLET | ORAL | Status: AC
  Filled 2023-04-04: qty 2

## 2023-04-04 MED ORDER — TRAMADOL HCL 50 MG PO TABS
ORAL_TABLET | ORAL | Status: AC
  Filled 2023-04-04: qty 1

## 2023-04-04 MED ORDER — PANTOPRAZOLE SODIUM 40 MG PO TBEC
DELAYED_RELEASE_TABLET | ORAL | Status: AC
  Filled 2023-04-04: qty 1

## 2023-04-04 MED ORDER — ORAL CARE MOUTH RINSE: 15.0000 mL | OROMUCOSAL | Status: DC | PRN

## 2023-04-04 MED ORDER — CEFAZOLIN SODIUM-DEXTROSE 2-4 GM/100ML-% IV SOLN
INTRAVENOUS | Status: AC
  Filled 2023-04-04: qty 100

## 2023-04-04 MED ORDER — ONDANSETRON HCL 4 MG PO TABS: 4.0000 mg | ORAL_TABLET | Freq: Four times a day (QID) | ORAL | 0 refills | Status: DC | PRN

## 2023-04-04 MED ORDER — KETOROLAC TROMETHAMINE 15 MG/ML IJ SOLN
INTRAMUSCULAR | Status: AC
Start: 2023-04-04 — End: ?
  Filled 2023-04-04: qty 1

## 2023-04-04 MED ORDER — OXYCODONE HCL 5 MG PO TABS: 5.0000 mg | ORAL_TABLET | Freq: Three times a day (TID) | ORAL | 0 refills | Status: DC | PRN

## 2023-04-04 MED ORDER — ENOXAPARIN SODIUM 40 MG/0.4ML IJ SOSY: 40.0000 mg | PREFILLED_SYRINGE | INTRAMUSCULAR | 0 refills | Status: DC

## 2023-04-04 MED ORDER — ACETAMINOPHEN 500 MG PO TABS
ORAL_TABLET | ORAL | Status: AC
Start: 2023-04-04 — End: ?
  Filled 2023-04-04: qty 2

## 2023-04-04 MED ORDER — ACETAMINOPHEN 500 MG PO TABS: 1000.0000 mg | ORAL_TABLET | Freq: Three times a day (TID) | ORAL | 0 refills | Status: AC

## 2023-04-04 MED ORDER — CELECOXIB 100 MG PO CAPS: 100.0000 mg | ORAL_CAPSULE | Freq: Two times a day (BID) | ORAL | 0 refills | Status: AC

## 2023-04-04 MED ORDER — TRAMADOL HCL 50 MG PO TABS: 50.0000 mg | ORAL_TABLET | Freq: Four times a day (QID) | ORAL | 0 refills | Status: DC | PRN

## 2023-04-04 MED ORDER — POTASSIUM CHLORIDE CRYS ER 20 MEQ PO TBCR
EXTENDED_RELEASE_TABLET | ORAL | Status: AC
  Filled 2023-04-04: qty 1

## 2023-04-04 MED ORDER — ENOXAPARIN SODIUM 30 MG/0.3ML IJ SOSY
PREFILLED_SYRINGE | INTRAMUSCULAR | Status: AC
Start: 2023-04-04 — End: ?
  Filled 2023-04-04: qty 0.3

## 2023-04-04 MED ORDER — HYDRALAZINE HCL 50 MG PO TABS
ORAL_TABLET | ORAL | Status: AC
  Filled 2023-04-04: qty 1

## 2023-04-04 MED ORDER — ACETAMINOPHEN 10 MG/ML IV SOLN
INTRAVENOUS | Status: AC
Start: 2023-04-04 — End: ?
  Filled 2023-04-04: qty 100

## 2023-04-04 NOTE — Evaluation (Signed)
Physical Therapy Evaluation Patient Details Name: Adam Glass MRN: [id number] DOB: September 10, 1955 Today's Date: 04/04/2023  History of Present Illness  Pt is a 67 y/o male admitted 04/03/23 for Left Total Knee Replacement. PmHx includes: Afib, prostate cancer, and HTN.   Clinical Impression  Pt received in bed and agreed to PT session. Pt's wife joined session shortly after the start of session. Pt reports pain to be on a scale of 5/10 with majority of pain occurring when left knee is in extension. Pt demonstrated and verbally expressed understanding of HEP and precautions discussed. Pt performed supine>sit ModI with the assistance of bed rails while HOB elevated, STS CGA with the use of RW (2wheels), amb CGA, and stair negotiation CGA. Pt tolerated Tx well today and will continue to benefit from skilled PT sessions following d/c to improve strength, ROM, activity tolerance, balance, and functional  mobility.         If plan is discharge home, recommend the following: A little help with walking and/or transfers;A little help with bathing/dressing/bathroom;Assist for transportation;Help with stairs or ramp for entrance   Can travel by private vehicle        Equipment Recommendations Rolling walker (2 wheels)  Recommendations for Other Services       Functional Status Assessment Patient has not had a recent decline in their functional status     Precautions / Restrictions Precautions Precautions: Knee Precaution Booklet Issued: Yes (comment) Precaution Comments: PT TKR HEP packet and precuations were provided in addition to OT TKR handout. Restrictions Weight Bearing Restrictions: Yes LLE Weight Bearing: Weight bearing as tolerated      Mobility  Bed Mobility Overal bed mobility: Needs Assistance Bed Mobility: Supine to Sit     Supine to sit: Used rails, HOB elevated, Modified independent (Device/Increase time)     General bed mobility comments: Pt performed bed mobility  the the use of bedrails while HOB elevated.    Transfers Overall transfer level: Needs assistance Equipment used: Rolling walker (2 wheels) Transfers: Sit to/from Stand Sit to Stand: Contact guard assist           General transfer comment: Pt performed STS with the use of RW (2wheels) CGA for saftey purposes. VC necessary for RW management.    Ambulation/Gait Ambulation/Gait assistance: Contact guard assist Gait Distance (Feet): 150 Feet Assistive device: Rolling walker (2 wheels) Gait Pattern/deviations: Step-to pattern, Antalgic Gait velocity: decreased     General Gait Details: Pt amb in hallway with the use of RW (2wheels) CGA. Pt reports discomfort primarily with knee extension.  Stairs Stairs: Yes Stairs assistance: Contact guard assist Stair Management: No rails, One rail Left, Step to pattern, With walker Number of Stairs: 8 General stair comments: Pt has stairs without railings and stairs with railings at his home. Pt performed stair negotiation both ways, as if there were no handrailings pt used walker, then performed stair negotiation again with railing.  Wheelchair Mobility     Tilt Bed    Modified Rankin (Stroke Patients Only)       Balance Overall balance assessment: Needs assistance Sitting-balance support: Feet supported Sitting balance-Leahy Scale: Good     Standing balance support: Single extremity supported Standing balance-Leahy Scale: Good Standing balance comment: Pt presents with minimal left knee flexion when standing due to pain. VC necessary to adjust posture.  Pertinent Vitals/Pain Pain Assessment Pain Assessment: 0-10 Pain Score: 5  Pain Location: Left knee Pain Descriptors / Indicators: Aching, Constant, Sharp Pain Intervention(s): Monitored during session    Home Living Family/patient expects to be discharged to:: Private residence Living Arrangements: Spouse/significant  other;Children Available Help at Discharge: Family;Available 24 hours/day Type of Home: House Home Access: Stairs to enter Entrance Stairs-Rails: None Entrance Stairs-Number of Steps: 4 Alternate Level Stairs-Number of Steps: 17 Home Layout: Two level;Able to live on main level with bedroom/bathroom Home Equipment: None      Prior Function Prior Level of Function : Independent/Modified Independent             Mobility Comments: Pt ind prior to admission. ADLs Comments: Pt ind prior to admission.     Extremity/Trunk Assessment   Upper Extremity Assessment Upper Extremity Assessment: Overall WFL for tasks assessed    Lower Extremity Assessment Lower Extremity Assessment: LLE deficits/detail LLE Deficits / Details: Left TKR       Communication   Communication Communication: No apparent difficulties Cueing Techniques: Verbal cues  Cognition Arousal: Alert Behavior During Therapy: WFL for tasks assessed/performed Overall Cognitive Status: Within Functional Limits for tasks assessed                                 General Comments: AOx4. Pt pleasant and willing to participate in PT session.        General Comments      Exercises Total Joint Exercises Ankle Circles/Pumps: AROM, Strengthening, Both, 5 reps, Seated Quad Sets: AROM, Strengthening, Left, 5 reps, Seated Short Arc Quad: AROM, Strengthening, Left, 5 reps, Seated Heel Slides: AROM, Strengthening, Left, 5 reps, Seated Hip ABduction/ADduction: AROM, Strengthening, Left, 5 reps, Seated Straight Leg Raises: AROM, Strengthening, Left, 5 reps, Seated Long Arc Quad: AROM, Strengthening, Left, 5 reps, Seated Knee Flexion: AROM, Strengthening, Left, 5 reps, Seated Goniometric ROM: Left knee flexion= 102 degrees. Left knee extension= 5 degrees.   Assessment/Plan    PT Assessment Patient does not need any further PT services  PT Problem List Decreased strength;Decreased range of motion;Decreased  activity tolerance;Decreased mobility;Pain       PT Treatment Interventions      PT Goals (Current goals can be found in the Care Plan section)  Acute Rehab PT Goals Patient Stated Goal: To have decreased pain with extension PT Goal Formulation: With patient Time For Goal Achievement: 04/18/23 Potential to Achieve Goals: Good    Frequency       Co-evaluation               AM-PAC PT "6 Clicks" Mobility  Outcome Measure Help needed turning from your back to your side while in a flat bed without using bedrails?: A Little Help needed moving from lying on your back to sitting on the side of a flat bed without using bedrails?: A Little Help needed moving to and from a bed to a chair (including a wheelchair)?: A Little Help needed standing up from a chair using your arms (e.g., wheelchair or bedside chair)?: A Little Help needed to walk in hospital room?: A Little Help needed climbing 3-5 steps with a railing? : A Lot 6 Click Score: 17    End of Session Equipment Utilized During Treatment: Gait belt Activity Tolerance: Patient tolerated treatment well Patient left: with nursing/sitter in room Nurse Communication: Mobility status PT Visit Diagnosis: Other abnormalities of gait and mobility (R26.89);Muscle weakness (generalized) (M62.81);Difficulty  in walking, not elsewhere classified (R26.2);Pain Pain - Right/Left: Left Pain - part of body: Knee    Time: 0910-0950 PT Time Calculation (min) (ACUTE ONLY): 40 min   Charges:   PT Evaluation $PT Eval Low Complexity: 1 Low PT Treatments $Gait Training: 8-22 mins $Therapeutic Exercise: 8-22 mins PT General Charges $$ ACUTE PT VISIT: 1 Visit         Dimitra Woodstock Sauvignon Howard SPT, LAT, ATC   Micharl Helmes Sauvignon-Howard 04/04/2023, 2:18 PM

## 2023-04-04 NOTE — Plan of Care (Signed)
  Problem: Activity: Goal: Ability to avoid complications of mobility impairment will improve Outcome: Progressing   Problem: Pain Management: Goal: Pain level will decrease with appropriate interventions Outcome: Progressing   Problem: Skin Integrity: Goal: Will show signs of wound healing Outcome: Progressing   

## 2023-04-04 NOTE — Progress Notes (Signed)
DISCHARGE NOTE:  Pt and wife given discharge instructions and scripts and verbalized understanding. TED hose on both legs. Pt wheeled to car by staff, wife providing transportation.

## 2023-04-04 NOTE — Progress Notes (Signed)
   Subjective: 1 Day Post-Op Procedure(s) (LRB): TOTAL KNEE ARTHROPLASTY (Left) Patient reports pain as mild.   Patient is well, and has had no acute complaints or problems Denies any CP, SOB, ABD pain. We will continue therapy today.  Plan is to go Home after hospital stay.  Objective: Vital signs in last 24 hours: Temp:  [96.8 F (36 C)-97.7 F (36.5 C)] 97.7 F (36.5 C) (10/29 0541) Pulse Rate:  [54-68] 61 (10/29 0541) Resp:  [9-29] 16 (10/29 0541) BP: (129-205)/(70-154) 159/85 (10/29 0541) SpO2:  [91 %-99 %] 95 % (10/29 0541) Weight:  [93 kg] 93 kg (10/28 0815)  Intake/Output from previous day: 10/28 0701 - 10/29 0700 In: 1841.4 [I.V.:1341.4; IV Piggyback:500] Out: 800 [Urine:800] Intake/Output this shift: No intake/output data recorded.  Recent Labs    04/04/23 0530  HGB 13.4   Recent Labs    04/04/23 0530  WBC 20.6*  RBC 4.29  HCT 38.4*  PLT 239   Recent Labs    04/04/23 0530  NA 137  K 3.7  CL 103  CO2 25  BUN 20  CREATININE 1.03  GLUCOSE 134*  CALCIUM 8.8*   No results for input(s): "LABPT", "INR" in the last 72 hours.  EXAM General - Patient is Alert, Appropriate, and Oriented Extremity - Neurovascular intact Sensation intact distally Intact pulses distally Dorsiflexion/Plantar flexion intact Dressing - dressing C/D/I and no drainage Motor Function - intact, moving foot and toes well on exam.   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   Weight as of this encounter: 93 kg. Advance diet Up with therapy Pain well-controlled Labs and vital signs are stable Care management to assist with discharge to home with home health PT today pending safe completion of PT goals  DVT Prophylaxis - Lovenox, TED hose, and SCDs Weight-Bearing as tolerated to left leg   T. Cranston Neighbor, PA-C [facility] 04/04/2023, 7:30 AM

## 2023-04-04 NOTE — Anesthesia Postprocedure Evaluation (Signed)
Anesthesia Post Note  Patient: Adam Glass  Procedure(s) Performed: TOTAL KNEE ARTHROPLASTY (Left: Knee)  Patient location during evaluation: Nursing Unit Anesthesia Type: Combined General/Spinal and Spinal Level of consciousness: awake and alert, awake and oriented Pain management: pain level controlled Vital Signs Assessment: post-procedure vital signs reviewed and stable Respiratory status: spontaneous breathing, nonlabored ventilation and respiratory function stable Cardiovascular status: blood pressure returned to baseline and stable Postop Assessment: no headache and no backache Anesthetic complications: no  There were no known notable events for this encounter.   Last Vitals:  Vitals:   04/04/23 0541 04/04/23 0759  BP: (!) 159/85 (!) 142/90  Pulse: 61 61  Resp: 16 18  Temp: 36.5 C 36.8 C  SpO2: 95% 96%    Last Pain:  Vitals:   04/04/23 0800  TempSrc:   PainSc: 6                  Chiropodist

## 2023-04-04 NOTE — Discharge Summary (Signed)
Physician Discharge Summary  Patient ID: Adam Glass MRN: [id number] DOB/AGE: 10/01/1955 67 y.o.  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 male who was admitted 04/03/2023 with a diagnosis of S/P TKR (total knee replacement), left and went to the operating room on 04/03/2023 and underwent the above named procedures.    Surgeries: Procedure(s): TOTAL KNEE ARTHROPLASTY on 04/03/2023 Patient tolerated the surgery well. Taken to PACU where she was stabilized and then transferred to the orthopedic floor.  Started on Lovenox 30 mg q 12 hrs. TEDs and SCDs applied bilaterally. Heels elevated on bed. No evidence of DVT. Negative Homan. Physical therapy started on day #1 for gait training and transfer. OT started day #1 for ADL and assisted  devices.  Patient's IV was d/c on day #1. Patient was able to safely and independently complete all PT goals. PT recommending discharge to home.    On post op day #1 patient was stable and ready for discharge to home with home health PT.  Implants: Femur: Persona Size 10 CR   Tibia: Persona Size H w/ 14x18mm stem extension  Poly: 11mm MC  Patella: 35x75mm symmetric   He was given perioperative antibiotics:  Anti-infectives (From admission, onward)    Start     Dose/Rate Route Frequency Ordered Stop   04/03/23 1730  ceFAZolin (ANCEF) IVPB 2g/100 mL premix        2 g 200 mL/hr over 30 Minutes Intravenous Every 6 hours 04/03/23 1700 04/04/23 0141   04/03/23 0815  ceFAZolin (ANCEF) IVPB 2g/100 mL premix        2 g 200 mL/hr over 30 Minutes Intravenous On call to O.R. 04/03/23 0804 04/03/23 1041   04/03/23 0815  vancomycin (VANCOCIN) IVPB 1000 mg/200 mL premix        1,000 mg 200 mL/hr over 60 Minutes Intravenous  Once 04/03/23 0804 04/03/23 1023     .  He was given sequential compression devices, early ambulation, and Lovenox, teds for DVT prophylaxis.  He benefited maximally from the hospital stay and there were no complications.    Recent vital signs:  Vitals:   04/04/23 0149 04/04/23 0541  BP: (!) 164/89 (!) 159/85  Pulse: 68 61  Resp: 16 16  Temp: (!) 97.5  F (36.4 C) 97.7 F (36.5 C)  SpO2: 94% 95%    Recent laboratory studies:  Lab Results  Component Value Date   HGB 13.4 04/04/2023   HGB 14.3 03/28/2023   HGB 12.7 (L) 02/01/2023   Lab Results  Component Value Date   WBC 20.6 (H) 04/04/2023   PLT 239 04/04/2023   No results found for: "INR" Lab Results  Component Value Date   NA 137 04/04/2023   K 3.7 04/04/2023   CL 103 04/04/2023   CO2 25 04/04/2023   BUN 20 04/04/2023   CREATININE 1.03 04/04/2023   GLUCOSE 134 (H) 04/04/2023    Discharge Medications:   Allergies as of 04/04/2023       Reactions   Shellfish Allergy Shortness Of Breath, Swelling    Throat/tongue swells        Medication List     STOP taking these medications    acetaminophen 650 MG CR tablet Commonly known as: TYLENOL Replaced by: acetaminophen 500 MG tablet       TAKE these medications    acetaminophen 500 MG tablet Commonly known as: TYLENOL Take 2 tablets (1,000 mg total) by mouth every 8 (eight) hours. Replaces: acetaminophen 650 MG CR tablet   aspirin EC 81 MG tablet Take 81 mg by mouth daily.   bisoprolol 5 MG tablet Commonly known as: ZEBETA Take 5 mg by mouth every evening.   celecoxib 100 MG capsule Commonly known as: CeleBREX Take 1 capsule (100 mg total) by mouth 2 (two) times daily for 7 days.   docusate 50 MG/5ML liquid Commonly known as: COLACE Take by mouth daily.   enoxaparin 40 MG/0.4ML injection Commonly known as: LOVENOX Inject 0.4 mLs (40 mg total) into the skin daily for 14 days.   EPINEPHrine 0.3 mg/0.3 mL Soaj injection Commonly known as: EPI-PEN Inject 0.3 mg into the muscle as needed for anaphylaxis.   fluticasone 50 MCG/ACT nasal spray Commonly known as: FLONASE Place 1 spray into both nostrils daily as needed (allergies.).   hydrALAZINE 50 MG tablet Commonly known as: APRESOLINE Take 50 mg by mouth in the morning and at bedtime.   HYDRALAZINE HCL PO Take by mouth.   LASTACAFT OP Apply to eye.   Melatonin 10 MG Caps Take by mouth.   mupirocin ointment 2 % Commonly known as: BACTROBAN Apply small amount to the inside of both nostrils TWICE a day for the next 5 days.   olmesartan 40 MG tablet Commonly known as: BENICAR Take 40 mg by mouth every evening.   olopatadine 0.1 % ophthalmic solution Commonly known as: PATANOL Place 1 drop into both eyes daily as needed for allergies (dryness).   ondansetron 4 MG tablet Commonly known as: ZOFRAN Take 1 tablet (4 mg total) by mouth every 6 (six) hours as needed for nausea.   oxyCODONE 5 MG immediate release tablet Commonly known as:  Roxicodone Take 1 tablet (5 mg total) by mouth every 8 (eight) hours as needed for breakthrough pain.   potassium chloride 10 MEQ CR capsule Commonly known as: MICRO-K Take 10 mEq by mouth daily.   tadalafil 10 MG tablet Commonly known as: CIALIS 1-2 tabs 1 hour prior to intercourse. What changed:  how much to take how to take this when to take this reasons to take this additional instructions   traMADol 50 MG tablet Commonly known as: ULTRAM Take 1 tablet (50 mg total) by mouth every 6 (six) hours as needed for moderate pain (pain score  4-6).        Diagnostic Studies: DG Knee Left Port  Result Date: 04/03/2023 CLINICAL DATA:  Status post left knee replacement. EXAM: PORTABLE LEFT KNEE - 1-2 VIEW COMPARISON:  None Available. FINDINGS: Left knee arthroplasty in expected alignment. No periprosthetic lucency or fracture. There has been patellar resurfacing. Recent postsurgical change includes air and edema in the soft tissues and joint space. IMPRESSION: Left knee arthroplasty without immediate postoperative complication. Electronically Signed   By: Narda Rutherford M.D.   On: 04/03/2023 15:26    Disposition:      Follow-up Information     Evon Slack, PA-C Follow up in 2 week(s).   Specialties: Orthopedic Surgery, Emergency Medicine Contact information: [address] [place] [address] [phone]                  Signed: Patience Musca 04/04/2023, 7:34 AM

## 2023-04-04 NOTE — Progress Notes (Signed)
Pts post void residual is 25 ml, PA Chris, notified, no orders received.

## 2023-04-04 NOTE — Discharge Instructions (Signed)

## 2023-04-04 NOTE — Progress Notes (Signed)
Patient is not able to walk the distance required to go the bathroom, or he/she is unable to safely negotiate stairs required to access the bathroom.  A 3in1 BSC will alleviate this problem  

## 2023-04-04 NOTE — Plan of Care (Signed)
  Problem: Activity: Goal: Range of joint motion will improve Outcome: Progressing   Problem: Pain Management: Goal: Pain level will decrease with appropriate interventions Outcome: Progressing

## 2023-05-03 DIAGNOSIS — M25662 Stiffness of left knee, not elsewhere classified: Secondary | ICD-10-CM | POA: Insufficient documentation

## 2023-05-17 ENCOUNTER — Ambulatory Visit: Payer: Medicare HMO | Admitting: Gastroenterology

## 2023-05-17 ENCOUNTER — Encounter: Payer: Self-pay | Admitting: Gastroenterology

## 2023-05-17 DIAGNOSIS — R634 Abnormal weight loss: Secondary | ICD-10-CM | POA: Diagnosis not present

## 2023-05-17 DIAGNOSIS — Z860100 Personal history of colon polyps, unspecified: Secondary | ICD-10-CM

## 2023-05-17 DIAGNOSIS — R490 Dysphonia: Secondary | ICD-10-CM

## 2023-05-17 DIAGNOSIS — Z860101 Personal history of adenomatous and serrated colon polyps: Secondary | ICD-10-CM

## 2023-05-17 DIAGNOSIS — R1310 Dysphagia, unspecified: Secondary | ICD-10-CM | POA: Diagnosis not present

## 2023-05-17 DIAGNOSIS — R630 Anorexia: Secondary | ICD-10-CM | POA: Diagnosis not present

## 2023-05-17 DIAGNOSIS — Z8719 Personal history of other diseases of the digestive system: Secondary | ICD-10-CM

## 2023-05-17 MED ORDER — NA SULFATE-K SULFATE-MG SULF 17.5-3.13-1.6 GM/177ML PO SOLN: 1.0000 | ORAL | 0 refills | Status: DC

## 2023-05-17 NOTE — Patient Instructions (Signed)
_______________________________________________________  If your blood pressure at your visit was 140/90 or greater, please contact your primary care physician to follow up on this. _______________________________________________________  If you are age 67 or older, your body mass index should be between 23-30. Your Body mass index is 23.49 kg/m. If this is out of the aforementioned range listed, please consider follow up with your Primary Care Provider. _______________________________________________________  The [facility] providers would like to encourage you to use [facility] to communicate with providers for non-urgent requests or questions.  Due to long hold times on the telephone, sending your provider a message by [facility] may be a faster and more efficient way to get a response.  Please allow 48 business hours for a response.  Please remember that this is for non-urgent requests.  _______________________________________________________  Bonita Quin have been scheduled for an endoscopy and colonoscopy. Please follow the written instructions given to you at your visit today.  Please pick up your prep supplies at the pharmacy within the next 1-3 days.  If you use inhalers (even only as needed), please bring them with you on the day of your procedure.  DO NOT TAKE 7 DAYS PRIOR TO TEST- Trulicity (dulaglutide) Ozempic, Wegovy (semaglutide) Mounjaro (tirzepatide) Bydureon Bcise (exanatide extended release)  DO NOT TAKE 1 DAY PRIOR TO YOUR TEST Rybelsus (semaglutide) Adlyxin (lixisenatide) Victoza (liraglutide) Byetta (exanatide) ___________________________________________________________________________  Due to recent changes in healthcare laws, you may see the results of your imaging and laboratory studies on MyChart before your provider has had a chance to review them.  We understand that in some cases there may be results that are confusing or concerning to you. Not all laboratory results  come back in the same time frame and the provider may be waiting for multiple results in order to interpret others.  Please give [place] 48 hours in order for your provider to thoroughly review all the results before contacting the office for clarification of your results.   It was a pleasure to see you today!  Vito Cirigliano, D.O.

## 2023-05-17 NOTE — Progress Notes (Signed)
Chief Complaint: Diverticulitis   Referring Provider:     Danella Penton, MD   HPI:     Adam Glass is a 67 y.o. male with medical history significant of osteoarthritis, paroxysmal atrial fibrillation, prostate cancer, lumbar spondylosis, constipation, tubular adenoma, depression, hypertension, sleep apnea on CPAP, referred to the Gastroenterology Clinic to establish care.  Hospitalized 8/23-28 with acute sigmoid diverticulitis with concern for early perforation.  General Surgery consulted and recommended medical management.  Inpatient GI service was not consulted.  Was treated with IV ABX and transition to Augmentin x 14 days with recommendation for outpatient GI follow-up. - CT: Descending and sigmoid diverticulosis with focal wall thickening and surrounding inflammation of the proximal sigmoid colon consistent with acute diverticulitis.  Single locule of extraluminal gas along anterior aspect of colon concerning for early perforation.  No abscess.  Prior to Aug, had diverticulitis flare requiring hospitalization 15+ years ago. Has had mild flares over the years, treated with short courses of Cipro/Flagyl by PCM. Previously followed with GI at [facility], last seen years ago.   He does report decreased appetite since hospitalization in August.  Had lost 20# d/t decreased appetite and p.o. intake.  Weight now stable.  More recently, has been having intermittent solid food dysphagia after recent TKR on 04/03/2023.  He initially had sore throat and hoarseness that he attributed to intubation for surgery, but still with lingering intermittent dysphagia.  No food impactions.  No history of heartburn, regurgitation, or other reflux symptoms.  Last colonoscopy was 10/20/2017 by Dr. Mechele Collin at [facility] and notable for medium cecal polyp (adenoma), small ascending polyp (adenoma), small hepatic flexure polyp x 3 (SSP x 2, features of TSA x 1), small transverse colon polyp (benign),  diverticulosis in the transverse and ascending colon, small internal hemorrhoids   Underwent TKR 04/03/2023.  Was seen by his PCM yesterday for erythema multiforme secondary to oxycodone.  Treated with Kenalog x 1 and currently on prednisone x 10 days, stopped oxycodone and using Tylenol for postoperative pain.  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   a.) surgical PCR (+) 03/28/2023 prior to LEFT TKR   OSA on CPAP    Prostate cancer (HCC)    Spondylosis of lumbar region without myelopathy or radiculopathy    Tubular adenoma      Past Surgical History:  Procedure Laterality Date   COLONOSCOPY     COLONOSCOPY WITH PROPOFOL N/A 10/20/2017   Procedure: COLONOSCOPY WITH PROPOFOL;  Surgeon: Scot Jun, MD;  Location: [facility] ENDOSCOPY;  Service: Endoscopy;  Laterality: N/A;   EYE SURGERY Left    Retina Tear, repaired with laser   KNEE ARTHROSCOPY Left    KNEE ARTHROSCOPY Left 11/09/2018   Procedure: ARTHROSCOPY KNEE LEFT;  Surgeon: Donato Heinz, MD;  Location: [facility] ORS;  Service: Orthopedics;  Laterality: Left;   ROTATOR CUFF REPAIR Left    TOTAL KNEE ARTHROPLASTY Left 04/03/2023   Procedure: TOTAL KNEE ARTHROPLASTY;  Surgeon: Audelia Acton,  Earna Coder, MD;  Location: [facility] ORS;  Service: Orthopedics;  Laterality: Left;   VASECTOMY     Family History  Problem Relation Age of Onset   Heart attack Father    Diabetes Brother    Liver disease Neg Hx    Colon cancer Neg Hx    Esophageal cancer Neg Hx    Social History   Tobacco Use   Smoking status: Never   Smokeless tobacco: Never  Vaping Use   Vaping status: Never  Used  Substance Use Topics   Alcohol use: Yes    Comment: occasional   Drug use: Never   Current Outpatient Medications  Medication Sig Dispense Refill   acetaminophen (TYLENOL) 500 MG tablet Take 2 tablets (1,000 mg total) by mouth every 8 (eight) hours. 30 tablet 0   Alcaftadine (LASTACAFT OP) Apply to eye.     aspirin EC 81 MG tablet Take 81 mg by mouth daily.     bisoprolol (ZEBETA) 5 MG tablet Take 5 mg by mouth every evening.     docusate (COLACE) 50 MG/5ML liquid Take by mouth daily.     EPINEPHrine 0.3 mg/0.3 mL IJ SOAJ injection Inject 0.3 mg into the muscle as needed for anaphylaxis.     fluticasone (FLONASE) 50 MCG/ACT nasal spray Place 1 spray into both nostrils daily as needed (allergies.).      hydrALAZINE (APRESOLINE) 50 MG tablet Take 50 mg by mouth in the morning and at bedtime.     HYDRALAZINE HCL PO Take by mouth.     Melatonin 10 MG CAPS Take by mouth.     olmesartan (BENICAR) 40 MG tablet Take 40 mg by mouth every evening.      olopatadine (PATANOL) 0.1 % ophthalmic solution Place 1 drop into both eyes daily as needed for allergies (dryness).     ondansetron (ZOFRAN) 4 MG tablet Take 1 tablet (4 mg total) by mouth every 6 (six) hours as needed for nausea. 20 tablet 0   potassium chloride (MICRO-K) 10 MEQ CR capsule Take 10 mEq by mouth daily.      tadalafil (CIALIS) 10 MG tablet 1-2 tabs 1 hour prior to intercourse. (Patient taking differently: Take 10 mg by mouth daily as needed for erectile dysfunction.) 10 tablet 0   traMADol (ULTRAM) 50 MG tablet Take 1 tablet (50 mg total) by mouth every 6 (six) hours as needed for moderate pain (pain score 4-6). 30 tablet 0   enoxaparin (LOVENOX) 40 MG/0.4ML injection Inject 0.4 mLs (40 mg total) into the skin daily for 14 days. 5.6 mL 0   No current facility-administered medications for this visit.   Allergies  Allergen Reactions   Shellfish Allergy Shortness Of Breath and Swelling    Throat/tongue swells     Review of  Systems: All systems reviewed and negative except where noted in HPI.     Physical Exam:    Wt Readings from Last 3 Encounters:  05/17/23 193 lb (87.5 kg)  04/03/23 205 lb (93 kg)  03/28/23 205 lb (93 kg)    BP (!) 140/76   Pulse 76   Ht 6\' 4"  (1.93 m)   Wt 193 lb (87.5 kg)   BMI 23.49 kg/m  Constitutional:  Pleasant, in no acute distress. Psychiatric: Normal mood and affect. Behavior is normal. Cardiovascular: Normal rate, regular rhythm. No edema Pulmonary/chest: Effort normal and breath sounds normal. No wheezing, rales or rhonchi. Abdominal: Soft, nondistended, nontender. Bowel sounds active throughout. There are no masses palpable. No  hepatomegaly. Neurological: Alert and oriented to person place and time. Skin: Skin is warm and dry. No rashes noted.   ASSESSMENT AND PLAN;   1) History of diverticulitis Patient reports having hospitalization for diverticulitis 15+ years ago, then mild flares over the years that have been responsive to short courses of antibiotics.  Most recently hospitalized in 01/2023 with acute sigmoid diverticulitis with concern for early perforation.  Was managed medically with IV ABX and transition to Augmentin x 14 days.  No return of index symptoms. - Plan for colonoscopy to evaluate.  He would like to schedule for sometime in February after he has follow-up with his Orthopedic Surgeon after recent TKR in 2 allow for improved mobility with time - To call me if he has any concern for recurrence of diverticulitis with plan for labs, imaging, antibiotics as appropriate  2) History of colon polyps Last colonoscopy 10/2017 with several "small and medium" polyps, with histology showing a mixture of adenomas, SSP's, and 1 polyp with features of a traditional serrated adenoma. - Repeat colonoscopy for ongoing surveillance.  Timeline as above  3) Decreased appetite 4) Weight loss 5) Dysphagia 6) Hoarseness - EGD to evaluate for mucosal/luminal pathology.   To be done at the same time as colonoscopy, which she would like to defer until February as outlined above  7) Total knee replacement - Has follow-up with Orthopedic Surgery in February  The indications, risks, and benefits of EGD and colonoscopy were explained to the patient in detail. Risks include but are not limited to bleeding, perforation, adverse reaction to medications, and cardiopulmonary compromise. Sequelae include but are not limited to the possibility of surgery, hospitalization, and mortality. The patient verbalized understanding and wished to proceed. All questions answered, referred to scheduler and bowel prep ordered. Further recommendations pending results of the exam.     Shellia Cleverly, DO, FACG  05/17/2023, 9:21 AM   Danella Penton, MD

## 2023-05-19 ENCOUNTER — Other Ambulatory Visit: Payer: Medicare HMO

## 2023-05-24 ENCOUNTER — Ambulatory Visit: Payer: Medicare HMO | Admitting: Urology

## 2023-06-08 DIAGNOSIS — L309 Dermatitis, unspecified: Secondary | ICD-10-CM | POA: Diagnosis not present

## 2023-06-08 DIAGNOSIS — L308 Other specified dermatitis: Secondary | ICD-10-CM | POA: Diagnosis not present

## 2023-06-08 DIAGNOSIS — L578 Other skin changes due to chronic exposure to nonionizing radiation: Secondary | ICD-10-CM | POA: Diagnosis not present

## 2023-06-08 DIAGNOSIS — D1801 Hemangioma of skin and subcutaneous tissue: Secondary | ICD-10-CM | POA: Diagnosis not present

## 2023-06-11 NOTE — Progress Notes (Signed)
 New Patient Note  RE: Adam Glass MRN: [id number] DOB: 09/22/1955 Date of Office Visit: 06/12/2023  Consult requested by: Cleotilde Oneil FALCON, MD Primary care provider: Cleotilde Oneil FALCON, MD  Chief Complaint: Urticaria (Ongoing issues for 2 weeks since his left total knee replacement surgery - has been to several specialty doctors including derm for this issues. Never did any patch testing for metals ), Rash (Neck, scalp, torso both sides, knee around replacement, back, and waist ), and Other (Did 2 rounds of prednisone  with a steroid shot and after finishing medication it comes back )  History of Present Illness: I had the pleasure of seeing Adam Glass for initial evaluation at the [facility] on 06/12/2023. He is a 68 y.o. male, who is self-referred here for the evaluation of rash.  Discussed the use of AI scribe software for clinical note transcription with the patient, who gave verbal consent to proceed.  The patient, with a history of hypertension, arthritis, and recent left knee replacement surgery, presents with a persistent, itchy rash that began two weeks post-surgery. The rash initially appeared around the neck, scalp, underarms, waist and groin area. Despite two steroid injections and two courses of prednisone , the rash recurred each time the treatment ended, albeit less severe. The rash is described as flat, pinkish-red and circular.   The patient has undergone several medication changes in recent months due to acute diverticulitis and knee surgery, including pain medications, antibiotics. However, the rash persisted even after discontinuing these medications. The patient has been using a prescribed cream containing a steroid and antifungal, which provides some relief but does not completely clear the rash. Over-the-counter allergy medications like Benadryl and Zyrtec  have also been used with limited success.  The patient's knee replacement surgery was uneventful,  with no postoperative complications or infections. The surgical scar has healed well. The patient questioned if the rash could be a reaction to the metal used in the knee replacement. He does have some similar rash around the left knee.   A dermatologist recently performed a biopsy of the rash, with results pending. The patient is awaiting these results and is scheduled for a follow-up appointment in four weeks.  The patient has a known allergy to shellfish, which causes severe reactions including tongue swelling. He also has a history of environmental allergies, including dog dander and pollen, as confirmed by previous allergy testing.     Patient is up to date with the following cancer screening tests: physical exam, colonoscopy scheduled for February.  This was the the first knee replacement. No prior issues with any metals.   Assessment and Plan: Adam Glass is a 68 y.o. male with: Rash and other nonspecific skin eruption Pruritus Recurrent, pruritic rash on torso, back, and waist following left knee replacement surgery. Biopsy performed with pending results. Possible erythema multiforme vs urticaria. No prior history of similar rash. Recent changes in multiple medications due to diverticulitis and knee surgery. Improved with systemic steroids.  Etiology unclear.  Awaiting biopsy report - please send me the results once you get it. Keep track of rashes and take pictures. Start allegra  (fexofenadine ) 180mg  twice a day. If symptoms are not controlled or causes drowsiness let us  know. Start Pepcid  (famotidine ) 20mg  twice a day.  Avoid the following potential triggers: alcohol, tight clothing, NSAIDs, hot showers and getting overheated. See below for proper skin care.  Use triamcinolone  0.1% ointment twice a day as needed for rash flares. Do not use on  the face, neck, armpits or groin area. Do not use more than 3 weeks in a row.  Get bloodwork.  Anaphylactic reaction due to food, subsequent  encounter Hospitalized after shrimp reaction in the past. Tolerates finned fish. Continue to avoid shellfish. Get bloodwork. I have prescribed epinephrine  injectable device and demonstrated proper use. For mild symptoms you can take over the counter antihistamines such as Benadryl 1-2 tablets = 25-50mg  and monitor symptoms closely. If symptoms worsen or if you have severe symptoms including breathing issues, throat closure, significant swelling, whole body hives, severe diarrhea and vomiting, lightheadedness then inject epinephrine  and seek immediate medical care afterwards. Emergency action plan given.  Return in about 4 weeks (around 07/10/2023).  Meds ordered this encounter  Medications   famotidine  (PEPCID ) 20 MG tablet    Sig: Take 1 tablet (20 mg total) by mouth 2 (two) times daily.    Dispense:  60 tablet    Refill:  2   fexofenadine  (ALLEGRA ) 180 MG tablet    Sig: Take 1 tablet (180 mg total) by mouth daily.    Dispense:  60 tablet    Refill:  2   triamcinolone  ointment (KENALOG ) 0.1 %    Sig: Apply 1 Application topically 2 (two) times daily as needed (rash flare). Do not use on the face, neck, armpits or groin area. Do not use more than 3 weeks in a row.    Dispense:  30 g    Refill:  1   EPINEPHrine  0.3 mg/0.3 mL IJ SOAJ injection    Sig: Inject 0.3 mg into the muscle as needed for anaphylaxis.    Dispense:  2 each    Refill:  1    May dispense generic/Mylan/Teva brand.   Lab Orders         Allergen Profile, Shellfish         Alpha-Gal Panel         ANA, IFA (with reflex)         C3 and C4         CBC with Differential/Platelet         Chronic Urticaria         Comprehensive metabolic panel         C-reactive protein         Tryptase         Thyroid  Cascade Profile         Sedimentation rate      Other allergy screening: Asthma: no Rhino conjunctivitis:  Allergic to cat, dog, weeds in the past.  Food allergy: yes Shellfish - tongue swelling started in age  30. Epinephrine  expired. Medication allergy: no Hymenoptera allergy: no Eczema:no History of recurrent infections suggestive of immunodeficency: no  Diagnostics: None.    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   Past Medical History:  Diagnosis Date   Acute diverticulitis    Arthritis    Atrial fibrillation (HCC)    Change in bowel habits    Constipation    Depression  Dysrhythmia    A fib   Hypertension    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   a.) surgical PCR (+) 03/28/2023 prior to LEFT TKR   OSA on CPAP    Prostate cancer (HCC)    Spondylosis of lumbar region without myelopathy or radiculopathy    Tubular adenoma    Urticaria    Past Surgical History: Past Surgical History:  Procedure Laterality Date   COLONOSCOPY     COLONOSCOPY WITH PROPOFOL  N/A 10/20/2017   Procedure: COLONOSCOPY WITH PROPOFOL ;  Surgeon: Viktoria Lamar DASEN, MD;  Location: [facility] ENDOSCOPY;  Service: Endoscopy;  Laterality: N/A;   EYE SURGERY Left    Retina Tear, repaired with laser   KNEE ARTHROSCOPY Left    KNEE ARTHROSCOPY Left 11/09/2018   Procedure: ARTHROSCOPY KNEE LEFT;  Surgeon: Mardee Lynwood SQUIBB, MD;  Location: [facility] ORS;  Service: Orthopedics;  Laterality: Left;   ROTATOR CUFF REPAIR Left    TOTAL KNEE ARTHROPLASTY Left 04/03/2023   Procedure: TOTAL KNEE ARTHROPLASTY;  Surgeon: Lorelle Hussar, MD;  Location: [facility] ORS;  Service: Orthopedics;  Laterality: Left;   VASECTOMY     Medication  List:  Current Outpatient Medications  Medication Sig Dispense Refill   acetaminophen  (TYLENOL ) 500 MG tablet Take 2 tablets (1,000 mg total) by mouth every 8 (eight) hours. 30 tablet 0   Alcaftadine (LASTACAFT OP) Apply to eye.     aspirin  EC 81 MG tablet Take 81 mg by mouth daily.     bisoprolol  (ZEBETA ) 5 MG tablet Take 5 mg by mouth every evening.     docusate (COLACE) 50 MG/5ML liquid Take by mouth daily.     EPINEPHrine  0.3 mg/0.3 mL IJ SOAJ injection Inject 0.3 mg into the muscle as needed for anaphylaxis. 2 each 1   famotidine  (PEPCID ) 20 MG tablet Take 1 tablet (20 mg total) by mouth 2 (two) times daily. 60 tablet 2   fexofenadine  (ALLEGRA ) 180 MG tablet Take 1 tablet (180 mg total) by mouth daily. 60 tablet 2   fluticasone (FLONASE) 50 MCG/ACT nasal spray Place 1 spray into both nostrils daily as needed (allergies.).      hydrALAZINE  (APRESOLINE ) 50 MG tablet Take 50 mg by mouth in the morning and at bedtime.     HYDRALAZINE  HCL PO Take by mouth.     Melatonin 10 MG CAPS Take by mouth.     Na Sulfate-K Sulfate-Mg Sulf (SUPREP BOWEL PREP KIT) 17.5-3.13-1.6 GM/177ML SOLN Take 1 kit by mouth as directed. 324 mL 0   olmesartan (BENICAR) 40 MG tablet Take 40 mg by mouth every evening.      olopatadine (PATANOL) 0.1 % ophthalmic solution Place 1 drop into both eyes daily as needed for allergies (dryness).     potassium chloride  (MICRO-K ) 10 MEQ CR capsule Take 10 mEq by mouth daily.      temazepam (RESTORIL) 30 MG capsule Take 30 mg by mouth at bedtime.     triamcinolone  ointment (KENALOG ) 0.1 % Apply 1 Application topically 2 (two) times daily as needed (rash flare). Do not use on the face, neck, armpits or groin area. Do not use more than 3 weeks in a row. 30 g 1   No current facility-administered medications for this visit.   Allergies: Allergies  Allergen Reactions   Shellfish Allergy Shortness Of Breath and Swelling    Throat/tongue swells   Social History: Social History    Socioeconomic History   Marital status: Married    Spouse name: Not on  file   Number of children: 2   Years of education: Not on file   Highest education level: Not on file  Occupational History   Occupation: retired  Tobacco Use   Smoking status: Never   Smokeless tobacco: Never  Vaping Use   Vaping status: Never Used  Substance and Sexual Activity   Alcohol use: Yes    Comment: occasional   Drug use: Never   Sexual activity: Yes    Birth control/protection: None  Other Topics Concern   Not on file  Social History Narrative   Not on file   Social Drivers of Health   Financial Resource Strain: Low Risk  (05/16/2023)   Received from [facility]   Overall Financial Resource Strain (CARDIA)    Difficulty of Paying Living Expenses: Not hard at all  Food Insecurity: No Food Insecurity (05/16/2023)   Received from [facility]   Hunger Vital Sign    Worried About Running Out of Food in the Last Year: Never true    Ran Out of Food in the Last Year: Never true  Transportation Needs: No Transportation Needs (05/16/2023)   Received from [facility] - Transportation    In the past 12 months, has lack of transportation kept you from medical appointments or from getting medications?: No    Lack of Transportation (Non-Medical): No  Physical Activity: Not on file  Stress: Not on file  Social Connections: Not on file   Lives in a house. Smoking: denies Occupation: retired  Landscape Architect HistorySurveyor, Minerals in the house: no Engineer, Civil (consulting) in the family room: no Carpet in the bedroom: yes Heating: gas Cooling: central Pet: yes 1 dog x many years  Family History: Family History  Problem Relation Age of Onset   Heart attack Father    Diabetes Brother    Liver disease Neg Hx    Colon cancer Neg Hx    Esophageal cancer Neg Hx    Problem                               Relation Asthma                                    no Eczema                                no Food allergy                          no Allergic rhino conjunctivitis     mother   Review of Systems  Constitutional:  Negative for appetite change, chills, fever and unexpected weight change.  HENT:  Negative for congestion and rhinorrhea.   Eyes:  Negative for itching.  Respiratory:  Negative for cough, chest tightness, shortness of breath and wheezing.   Cardiovascular:  Negative for chest pain.  Gastrointestinal:  Negative for abdominal pain.  Genitourinary:  Negative for difficulty urinating.  Skin:  Positive for rash.  Neurological:  Negative for headaches.    Objective: BP 134/88 (Cuff Size: Normal)   Pulse 68   Temp 98 F (36.7 C)   Resp 18   Ht 6' 4 (1.93 m)   Wt 190 lb 12.8 oz (  86.5 kg)   SpO2 96%   BMI 23.22 kg/m  Body mass index is 23.22 kg/m. Physical Exam Vitals and nursing note reviewed.  Constitutional:      Appearance: Normal appearance. He is well-developed.  HENT:     Head: Normocephalic and atraumatic.     Right Ear: Tympanic membrane and external ear normal.     Left Ear: Tympanic membrane and external ear normal.     Nose: Nose normal.     Mouth/Throat:     Mouth: Mucous membranes are moist.     Pharynx: Oropharynx is clear.  Eyes:     Conjunctiva/sclera: Conjunctivae normal.  Cardiovascular:     Rate and Rhythm: Normal rate and regular rhythm.     Heart sounds: Normal heart sounds. No murmur heard.    No friction rub. No gallop.  Pulmonary:     Effort: Pulmonary effort is normal.     Breath sounds: Normal breath sounds. No wheezing, rhonchi or rales.  Musculoskeletal:     Cervical back: Neck supple.  Skin:    General: Skin is warm.     Findings: Rash present.     Comments: Diffuse circular erythematous patches on torso, upper extremities which are blanchable.   Neurological:     Mental Status: He is alert and oriented to person, place, and time.  Psychiatric:        Behavior:  Behavior normal.   The plan was reviewed with the patient/family, and all questions/concerned were addressed.  It was my pleasure to see Adam Glass today and participate in his care. Please feel free to contact me with any questions or concerns.  Sincerely,  Orlan Cramp, DO Allergy & Immunology  [facility]  [facility] office: [phone] [facility] office: [phone]

## 2023-06-12 ENCOUNTER — Other Ambulatory Visit: Payer: Self-pay

## 2023-06-12 ENCOUNTER — Encounter: Payer: Self-pay | Admitting: Allergy

## 2023-06-12 ENCOUNTER — Ambulatory Visit (INDEPENDENT_AMBULATORY_CARE_PROVIDER_SITE_OTHER): Payer: No Typology Code available for payment source | Admitting: Allergy

## 2023-06-12 VITALS — BP 134/88 | HR 68 | Temp 98.0°F | Resp 18 | Ht 76.0 in | Wt 190.8 lb

## 2023-06-12 DIAGNOSIS — T7800XD Anaphylactic reaction due to unspecified food, subsequent encounter: Secondary | ICD-10-CM | POA: Diagnosis not present

## 2023-06-12 DIAGNOSIS — R21 Rash and other nonspecific skin eruption: Secondary | ICD-10-CM

## 2023-06-12 DIAGNOSIS — L299 Pruritus, unspecified: Secondary | ICD-10-CM

## 2023-06-12 DIAGNOSIS — M25662 Stiffness of left knee, not elsewhere classified: Secondary | ICD-10-CM | POA: Diagnosis not present

## 2023-06-12 MED ORDER — FAMOTIDINE 20 MG PO TABS: 20.0000 mg | ORAL_TABLET | Freq: Two times a day (BID) | ORAL | 2 refills | Status: DC

## 2023-06-12 MED ORDER — EPINEPHRINE 0.3 MG/0.3ML IJ SOAJ: 0.3000 mg | INTRAMUSCULAR | 1 refills | Status: AC | PRN

## 2023-06-12 MED ORDER — TRIAMCINOLONE ACETONIDE 0.1 % EX OINT: 1.0000 | TOPICAL_OINTMENT | Freq: Two times a day (BID) | CUTANEOUS | 1 refills | Status: DC | PRN

## 2023-06-12 MED ORDER — FEXOFENADINE HCL 180 MG PO TABS: 180.0000 mg | ORAL_TABLET | Freq: Every day | ORAL | 2 refills | Status: DC

## 2023-06-12 NOTE — Patient Instructions (Addendum)
 Rash Etiology unclear.  Awaiting biopsy report - please send me the results once you get it. Keep track of rashes and take pictures. Start allegra  (fexofenadine ) 180mg  twice a day. If symptoms are not controlled or causes drowsiness let us  know. Start Pepcid  (famotidine ) 20mg  twice a day.  Avoid the following potential triggers: alcohol, tight clothing, NSAIDs, hot showers and getting overheated. See below for proper skin care.   Use triamcinolone  0.1% ointment twice a day as needed for rash flares. Do not use on the face, neck, armpits or groin area. Do not use more than 3 weeks in a row.   Get bloodwork. We are ordering labs, so please allow 1-2 weeks for the results to come back. With the newly implemented Cures Act, the labs might be visible to you at the same time that they become visible to me. However, I will not address the results until all of the results are back, so please be patient.   Food allergy Continue to avoid shellfish. I have prescribed epinephrine  injectable device and demonstrated proper use. For mild symptoms you can take over the counter antihistamines such as Benadryl 1-2 tablets = 25-50mg  and monitor symptoms closely. If symptoms worsen or if you have severe symptoms including breathing issues, throat closure, significant swelling, whole body hives, severe diarrhea and vomiting, lightheadedness then inject epinephrine  and seek immediate medical care afterwards. Emergency action plan given.  Return in about 4 weeks (around 07/10/2023). Or sooner if needed.  Skin care recommendations  Bath time: Always use lukewarm water. AVOID very hot or cold water. Keep bathing time to 5-10 minutes. Do NOT use bubble bath. Use a mild soap and use just enough to wash the dirty areas. Do NOT scrub skin vigorously.  After bathing, pat dry your skin with a towel. Do NOT rub or scrub the skin.  Moisturizers and prescriptions:  ALWAYS apply moisturizers immediately after bathing  (within 3 minutes). This helps to lock-in moisture. Use the moisturizer several times a day over the whole body. Good summer moisturizers include: Aveeno, CeraVe, Cetaphil. Good winter moisturizers include: Aquaphor, Vaseline, Cerave, Cetaphil, Eucerin, Vanicream. When using moisturizers along with medications, the moisturizer should be applied about one hour after applying the medication to prevent diluting effect of the medication or moisturize around where you applied the medications. When not using medications, the moisturizer can be continued twice daily as maintenance.  Laundry and clothing: Avoid laundry products with added color or perfumes. Use unscented hypo-allergenic laundry products such as Tide free, Cheer free & gentle, and All free and clear.  If the skin still seems dry or sensitive, you can try double-rinsing the clothes. Avoid tight or scratchy clothing such as wool. Do not use fabric softeners or dyer sheets.

## 2023-06-20 ENCOUNTER — Encounter: Payer: Self-pay | Admitting: Allergy

## 2023-06-21 DIAGNOSIS — M25662 Stiffness of left knee, not elsewhere classified: Secondary | ICD-10-CM | POA: Diagnosis not present

## 2023-06-21 LAB — CBC WITH DIFFERENTIAL/PLATELET
Basophils Absolute: 0 10*3/uL (ref 0.0–0.2)
Basos: 0 %
EOS (ABSOLUTE): 0.1 10*3/uL (ref 0.0–0.4)
Eos: 1 %
Hematocrit: 45.9 % (ref 37.5–51.0)
Hemoglobin: 15.2 g/dL (ref 13.0–17.7)
Immature Grans (Abs): 0 10*3/uL (ref 0.0–0.1)
Immature Granulocytes: 0 %
Lymphocytes Absolute: 2.3 10*3/uL (ref 0.7–3.1)
Lymphs: 23 %
MCH: 30.6 pg (ref 26.6–33.0)
MCHC: 33.1 g/dL (ref 31.5–35.7)
MCV: 93 fL (ref 79–97)
Monocytes Absolute: 0.8 10*3/uL (ref 0.1–0.9)
Monocytes: 8 %
Neutrophils Absolute: 6.6 10*3/uL (ref 1.4–7.0)
Neutrophils: 68 %
Platelets: 240 10*3/uL (ref 150–450)
RBC: 4.96 x10E6/uL (ref 4.14–5.80)
RDW: 12.8 % (ref 11.6–15.4)
WBC: 9.8 10*3/uL (ref 3.4–10.8)

## 2023-06-21 LAB — COMPREHENSIVE METABOLIC PANEL
ALT: 20 [IU]/L (ref 0–44)
AST: 17 [IU]/L (ref 0–40)
Albumin: 4.5 g/dL (ref 3.9–4.9)
Alkaline Phosphatase: 89 [IU]/L (ref 44–121)
BUN/Creatinine Ratio: 22 (ref 10–24)
BUN: 24 mg/dL (ref 8–27)
Bilirubin Total: 0.3 mg/dL (ref 0.0–1.2)
CO2: 25 mmol/L (ref 20–29)
Calcium: 9.9 mg/dL (ref 8.6–10.2)
Chloride: 102 mmol/L (ref 96–106)
Creatinine, Ser: 1.1 mg/dL (ref 0.76–1.27)
Globulin, Total: 2.4 g/dL (ref 1.5–4.5)
Glucose: 119 mg/dL — ABNORMAL HIGH (ref 70–99)
Potassium: 4.3 mmol/L (ref 3.5–5.2)
Sodium: 141 mmol/L (ref 134–144)
Total Protein: 6.9 g/dL (ref 6.0–8.5)
eGFR: 74 mL/min/{1.73_m2} (ref >59)

## 2023-06-21 LAB — ALLERGEN PROFILE, SHELLFISH
Clam IgE: <0.1 kU/L
F023-IgE Crab: 0.27 kU/L — AB
F080-IgE Lobster: 0.14 kU/L — AB
F290-IgE Oyster: <0.1 kU/L
Scallop IgE: <0.1 kU/L
Shrimp IgE: 0.31 kU/L — AB

## 2023-06-21 LAB — ANTINUCLEAR ANTIBODIES, IFA: ANA Titer 1: NEGATIVE

## 2023-06-21 LAB — THYROID CASCADE PROFILE: TSH: 1.49 u[IU]/mL (ref 0.450–4.500)

## 2023-06-21 LAB — C3 AND C4
Complement C3, Serum: 144 mg/dL (ref 82–167)
Complement C4, Serum: 24 mg/dL (ref 12–38)

## 2023-06-21 LAB — ALPHA-GAL PANEL
Allergen Lamb IgE: 0.35 kU/L — AB
Beef IgE: 0.85 kU/L — AB
IgE (Immunoglobulin E), Serum: 152 [IU]/mL (ref 6–495)
O215-IgE Alpha-Gal: 2.49 kU/L — AB
Pork IgE: 0.34 kU/L — AB

## 2023-06-21 LAB — C-REACTIVE PROTEIN: CRP: 1 mg/L (ref 0–10)

## 2023-06-21 LAB — SEDIMENTATION RATE: Sed Rate: 14 mm/h (ref 0–30)

## 2023-06-21 LAB — TRYPTASE: Tryptase: 5.5 ug/L (ref 2.2–13.2)

## 2023-06-21 LAB — CHRONIC URTICARIA: cu index: 5.9 (ref <10)

## 2023-06-21 NOTE — Telephone Encounter (Signed)
 Received fax from [facility] regarding patient's biopsy  - documents have been placed in provider's in basket.  Forwarding message to provider as update.

## 2023-06-26 ENCOUNTER — Encounter: Payer: Self-pay | Admitting: Allergy

## 2023-06-26 NOTE — Telephone Encounter (Signed)
Reviewed notes from  [facility] dermat[facility] . Date of service: Jan 2025. See scanned notes for full documentation. 06/08/2023 biopsy results. "Infiltrate of lymphocytes and eos that focally obscure the dermo-epidermal junction, accompanied by somewhat subtle vacuolar change. While the findings are not entirely specific, an interface drug eruption seems most likely in this biopsy. An early lesion of erythema multiforme could also be a possibility. A PAS stain is negative for fungi."

## 2023-06-27 ENCOUNTER — Ambulatory Visit (INDEPENDENT_AMBULATORY_CARE_PROVIDER_SITE_OTHER): Payer: No Typology Code available for payment source | Admitting: Allergy

## 2023-06-27 ENCOUNTER — Encounter: Payer: Self-pay | Admitting: Allergy

## 2023-06-27 VITALS — BP 126/76 | HR 65 | Temp 98.5°F | Resp 16

## 2023-06-27 DIAGNOSIS — L509 Urticaria, unspecified: Secondary | ICD-10-CM | POA: Diagnosis not present

## 2023-06-27 DIAGNOSIS — Z91018 Allergy to other foods: Secondary | ICD-10-CM | POA: Diagnosis not present

## 2023-06-27 DIAGNOSIS — R21 Rash and other nonspecific skin eruption: Secondary | ICD-10-CM

## 2023-06-27 DIAGNOSIS — T781XXD Other adverse food reactions, not elsewhere classified, subsequent encounter: Secondary | ICD-10-CM | POA: Diagnosis not present

## 2023-06-27 MED ORDER — METHYLPREDNISOLONE ACETATE 80 MG/ML IJ SUSP
80.0000 mg | Freq: Once | INTRAMUSCULAR | Status: AC
  Administered 2023-06-27: 80 mg via INTRAMUSCULAR

## 2023-06-27 MED ORDER — METHYLPREDNISOLONE 4 MG PO TBPK: ORAL_TABLET | ORAL | 0 refills | Status: DC

## 2023-06-27 MED ORDER — CETIRIZINE HCL 10 MG PO TABS: 10.0000 mg | ORAL_TABLET | Freq: Two times a day (BID) | ORAL | 3 refills | Status: DC

## 2023-06-27 MED ORDER — CETIRIZINE HCL 10 MG PO TABS: 10.0000 mg | ORAL_TABLET | Freq: Two times a day (BID) | ORAL | 3 refills | Status: AC

## 2023-06-27 MED ORDER — MONTELUKAST SODIUM 10 MG PO TABS: 10.0000 mg | ORAL_TABLET | Freq: Every day | ORAL | 3 refills | Status: DC

## 2023-06-27 NOTE — Progress Notes (Signed)
Follow Up Note  RE: Adam Glass MRN: [id number] DOB: 04-15-1956 Date of Office Visit: 06/27/2023  Referring provider: Danella Penton, MD Primary care provider: Danella Penton, MD  Chief Complaint: Rash  History of Present Illness: I had the pleasure of seeing Adam Glass for a follow up visit at the [facility] on 06/27/2023. He is a 68 y.o. male, who is being followed for rash and food allergy. His previous allergy office visit was on 06/12/2023 with Dr. Selena Batten. Today is a new complaint visit of rash . He is accompanied today by his spouse who provided/contributed to the history.   Discussed the use of AI scribe software for clinical note transcription with the patient, who gave verbal consent to proceed.  The patient presented with a persistent, itchy rash that started approximately two weeks post knee surgery in October. The rash, initially appearing on the legs, has since spread to various parts of the body, including the fingers, back, and shoulder. The rash's appearance varies from bright red to more muted tones, and it seems to move around the body, with some areas clearing up while new ones appear. The patient has been using a steroid cream, triamcinolone, which provides temporary relief from itching.  The patient has been taking Allegra (180mg  twice daily) and famotidine (twice daily) for the rash, but reports no noticeable improvement in itching. He also takes several medications for hypertension and has been on temazepam for sleep since his knee surgery. The patient has been adhering to a diet that includes red meat, despite recent advice to avoid it due to a potential alpha-gal allergy.  The patient has had two steroid shots for the rash, each time accompanied by a course of prednisone. These treatments seemed to calm the rash for several weeks, but it always returned.     I reviewed the bloodwork. Blood count, kidney function, liver function, electrolytes,  thyroid, autoimmune screener, inflammation markers, chronic urticaria index (checks for autoantibodies that trigger mast cells), tryptase (checks for mast cell issues) were all normal which is great.    Shellfish panel was borderline positive. Start to avoid shellfish panel. 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   06/08/2023 biopsy results. "Infiltrate of lymphocytes and eos that focally obscure the dermo-epidermal junction, accompanied by somewhat subtle vacuolar change. While the findings are not entirely specific, an interface drug eruption seems most likely in this biopsy. An early lesion of erythema multiforme could also be a possibility. A PAS stain is negative for fungi."  Assessment and Plan: Adam Glass is a 68 y.o. male with: Rash and other nonspecific skin eruption Urticaria Past history - Recurrent, pruritic rash on torso, back, and waist following left knee replacement surgery. No prior history of similar rash. Recent changes in multiple medications due to diverticulitis and knee surgery. Improved with systemic steroids.  Interim history - 2025 labs unremarkable except shellfish panel borderline positive and alpha gal positive. Skin biopsy ? Drug eruption vs early erythema multiforme. Persistent rash despite antihistamine therapy.  Diffuse blanchable erythematous patches throughout the body concerning for urticaria. Possible association with red meat consumption due to positive alpha-gal allergy test. No clear association with medication use. Stopped ASA wit no benefit. Biopsy results don't really correlate with clinical history. Patient concerned about the left knee replacement joint which healed appropriately with no infections however there seems to be a more clustering of the rash/hives on the left knee  sparing the right knee.  Depo 80mg  given today. Start medrol pak. Keep track of rashes and take pictures. Stop allegra as ineffective.   Start zyrtec 10mg  twice a day and may take up to 20mg  twice a day.  If symptoms are not controlled or causes drowsiness let us know. Continue Pepcid (famotidine) 20mg  twice a day.  Start Singulair (montelukast) 10mg  daily at night. Cautioned that in some children/adults can experience behavioral changes including hyperactivity, agitation, depression, sleep disturbances and suicidal ideations. These side effects are rare, but if you notice them you should notify me and discontinue Singulair (montelukast). Avoid the following potential triggers: alcohol, tight clothing, NSAIDs, hot showers and getting overheated. Continue proper skin care.  Use triamcinolone 0.1% ointment twice a day as needed for rash flares. Do not use on the face, neck, armpits or groin area. Do not use more than 3 weeks in a row.  May use topical benadryl for the itching as needed.  If no improvement - then will start Xolair injections. Handout given.  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 Tolerates finned fish. Interim history - 2025 bloodwork borderline positive to shellfish and positive to alpha gal.  Continue to avoid shellfish and red meat.  For mild symptoms you can take over the counter antihistamines such as Benadryl 1-2 tablets = 25-50mg  and monitor symptoms closely. If symptoms worsen or if you have severe symptoms including breathing issues, throat closure, significant swelling, whole body hives, severe diarrhea and vomiting, lightheadedness then inject epinephrine and seek immediate medical care afterwards. Emergency action plan in place.  Return in about 4 weeks (around 07/25/2023).  Meds ordered this encounter  Medications   methylPREDNISolone (MEDROL DOSEPAK) 4 MG TBPK tablet    Sig: Take 6 tablets on day 1, 5 tablets on day 2, 4 tabs on day 3, 3 tabs on day 4, 2 tabs on day 5, 1 tab on  day 6.    Dispense:  21 tablet    Refill:  0   montelukast (SINGULAIR) 10 MG tablet    Sig: Take 1 tablet (10 mg total) by mouth at bedtime.    Dispense:  30 tablet    Refill:  3   DISCONTD: cetirizine (ZYRTEC ALLERGY) 10 MG tablet    Sig: Take 1 tablet (10 mg total) by mouth 2 (two) times daily.    Dispense:  60 tablet    Refill:  3   methylPREDNISolone acetate (DEPO-MEDROL) injection 80 mg   cetirizine (ZYRTEC ALLERGY) 10 MG tablet    Sig: Take 1 tablet (10 mg total) by mouth 2 (two) times daily.    Dispense:  60 tablet    Refill:  3   Lab Orders  No laboratory test(s) ordered today    Diagnostics: None.   Medication List:  Current Outpatient Medications  Medication Sig Dispense Refill   acetaminophen (TYLENOL) 500 MG tablet Take 2 tablets (1,000 mg total) by mouth every 8 (eight) hours. 30 tablet 0   Alcaftadine (LASTACAFT OP) Apply to eye.     bisoprolol (ZEBETA) 5 MG tablet Take 5 mg by mouth every evening.     docusate (COLACE) 50 MG/5ML liquid Take by mouth daily.     EPINEPHrine 0.3 mg/0.3 mL IJ SOAJ injection Inject 0.3 mg into the muscle as needed for anaphylaxis. 2 each 1   famotidine (PEPCID) 20 MG tablet Take 1 tablet (20 mg total) by  mouth 2 (two) times daily. 60 tablet 2   fluticasone (FLONASE) 50 MCG/ACT nasal spray Place 1 spray into both nostrils daily as needed (allergies.).      hydrALAZINE (APRESOLINE) 50 MG tablet Take 50 mg by mouth in the morning and at bedtime.     HYDRALAZINE HCL PO Take by mouth.     Melatonin 10 MG CAPS Take by mouth.     methylPREDNISolone (MEDROL DOSEPAK) 4 MG TBPK tablet Take 6 tablets on day 1, 5 tablets on day 2, 4 tabs on day 3, 3 tabs on day 4, 2 tabs on day 5, 1 tab on day 6. 21 tablet 0   montelukast (SINGULAIR) 10 MG tablet Take 1 tablet (10 mg total) by mouth at bedtime. 30 tablet 3   Na Sulfate-K Sulfate-Mg Sulf (SUPREP BOWEL PREP KIT) 17.5-3.13-1.6 GM/177ML SOLN Take 1 kit by mouth as directed. 324 mL 0   olmesartan  (BENICAR) 40 MG tablet Take 40 mg by mouth every evening.      olopatadine (PATANOL) 0.1 % ophthalmic solution Place 1 drop into both eyes daily as needed for allergies (dryness).     potassium chloride (MICRO-K) 10 MEQ CR capsule Take 10 mEq by mouth daily.      temazepam (RESTORIL) 30 MG capsule Take 30 mg by mouth at bedtime.     triamcinolone ointment (KENALOG) 0.1 % Apply 1 Application topically 2 (two) times daily as needed (rash flare). Do not use on the face, neck, armpits or groin area. Do not use more than 3 weeks in a row. 30 g 1   cetirizine (ZYRTEC ALLERGY) 10 MG tablet Take 1 tablet (10 mg total) by mouth 2 (two) times daily. 60 tablet 3   No current facility-administered medications for this visit.   Allergies: Allergies  Allergen Reactions   Shellfish Allergy Shortness Of Breath and Swelling    Throat/tongue swells   Alpha-Gal    I reviewed his past medical history, social history, family history, and environmental history and no significant changes have been reported from his previous visit.  Review of Systems  Constitutional:  Negative for appetite change, chills, fever and unexpected weight change.  HENT:  Negative for congestion and rhinorrhea.   Eyes:  Negative for itching.  Respiratory:  Negative for cough, chest tightness, shortness of breath and wheezing.   Cardiovascular:  Negative for chest pain.  Gastrointestinal:  Negative for abdominal pain.  Genitourinary:  Negative for difficulty urinating.  Skin:  Positive for rash.  Allergic/Immunologic: Positive for food allergies.  Neurological:  Negative for headaches.    Objective: BP 126/76   Pulse 65   Temp 98.5 F (36.9 C) (Temporal)   Resp 16   SpO2 96%  There is no height or weight on file to calculate BMI. Physical Exam Vitals and nursing note reviewed.  Constitutional:      Appearance: Normal appearance. He is well-developed.  HENT:     Head: Normocephalic and atraumatic.     Right Ear: Tympanic  membrane and external ear normal.     Left Ear: Tympanic membrane and external ear normal.     Nose: Nose normal.     Mouth/Throat:     Mouth: Mucous membranes are moist.     Pharynx: Oropharynx is clear.  Eyes:     Conjunctiva/sclera: Conjunctivae normal.  Cardiovascular:     Rate and Rhythm: Normal rate and regular rhythm.     Heart sounds: Normal heart sounds. No murmur heard.  No friction rub. No gallop.  Pulmonary:     Effort: Pulmonary effort is normal.     Breath sounds: Normal breath sounds. No wheezing, rhonchi or rales.  Musculoskeletal:     Cervical back: Neck supple.  Skin:    General: Skin is warm.     Findings: Rash present.     Comments: Diffuse circular erythematous patches on torso, upper extremities, lower extremities, groin area, waist area which are blanchable.   Neurological:     Mental Status: He is alert and oriented to person, place, and time.  Psychiatric:        Behavior: Behavior normal.    Previous notes and tests were reviewed. The plan was reviewed with the patient/family, and all questions/concerned were addressed.  It was my pleasure to see Adam Glass today and participate in his care. Please feel free to contact me with any questions or concerns.  Sincerely,  Wyline Mood, DO Allergy & Immunology  [facility] office: [phone] [facility] office: [phone]

## 2023-06-27 NOTE — Patient Instructions (Addendum)
Rash Depo 80mg  given today. Start medrol pak.  Keep track of rashes and take pictures. Stop allegra Start zyrtec 10mg  twice a day and may take up to 20mg  twice a day.  If symptoms are not controlled or causes drowsiness let us know. Continue Pepcid (famotidine) 20mg  twice a day.  Start Singulair (montelukast) 10mg  daily at night. Cautioned that in some children/adults can experience behavioral changes including hyperactivity, agitation, depression, sleep disturbances and suicidal ideations. These side effects are rare, but if you notice them you should notify me and discontinue Singulair (montelukast).  Avoid the following potential triggers: alcohol, tight clothing, NSAIDs, hot showers and getting overheated. Continue proper skin care.   Use triamcinolone 0.1% ointment twice a day as needed for rash flares. Do not use on the face, neck, armpits or groin area. Do not use more than 3 weeks in a row.  May use topical benadryl for the itching as needed.  If no improvement - then will start Xolair injections. Handout given.  Food allergy Continue to avoid shellfish and red meat.  For mild symptoms you can take over the counter antihistamines such as Benadryl 1-2 tablets = 25-50mg  and monitor symptoms closely. If symptoms worsen or if you have severe symptoms including breathing issues, throat closure, significant swelling, whole body hives, severe diarrhea and vomiting, lightheadedness then inject epinephrine and seek immediate medical care afterwards. Emergency action plan in place.   Return in about 4 weeks (around 07/25/2023). Or sooner if needed.  Skin care recommendations  Bath time: Always use lukewarm water. AVOID very hot or cold water. Keep bathing time to 5-10 minutes. Do NOT use bubble bath. Use a mild soap and use just enough to wash the dirty areas. Do NOT scrub skin vigorously.  After bathing, pat dry your skin with a towel. Do NOT rub or scrub the skin.  Moisturizers and  prescriptions:  ALWAYS apply moisturizers immediately after bathing (within 3 minutes). This helps to lock-in moisture. Use the moisturizer several times a day over the whole body. Good summer moisturizers include: Aveeno, CeraVe, Cetaphil. Good winter moisturizers include: Aquaphor, Vaseline, Cerave, Cetaphil, Eucerin, Vanicream. When using moisturizers along with medications, the moisturizer should be applied about one hour after applying the medication to prevent diluting effect of the medication or moisturize around where you applied the medications. When not using medications, the moisturizer can be continued twice daily as maintenance.  Laundry and clothing: Avoid laundry products with added color or perfumes. Use unscented hypo-allergenic laundry products such as Tide free, Cheer free & gentle, and All free and clear.  If the skin still seems dry or sensitive, you can try double-rinsing the clothes. Avoid tight or scratchy clothing such as wool. Do not use fabric softeners or dyer sheets.

## 2023-07-10 ENCOUNTER — Ambulatory Visit: Payer: No Typology Code available for payment source | Admitting: Allergy

## 2023-07-11 ENCOUNTER — Ambulatory Visit (INDEPENDENT_AMBULATORY_CARE_PROVIDER_SITE_OTHER): Payer: No Typology Code available for payment source | Admitting: Allergy

## 2023-07-11 ENCOUNTER — Encounter: Payer: Self-pay | Admitting: Allergy

## 2023-07-11 VITALS — BP 130/88 | HR 55 | Temp 97.9°F | Resp 14

## 2023-07-11 DIAGNOSIS — Z91018 Allergy to other foods: Secondary | ICD-10-CM | POA: Diagnosis not present

## 2023-07-11 DIAGNOSIS — T7800XD Anaphylactic reaction due to unspecified food, subsequent encounter: Secondary | ICD-10-CM | POA: Diagnosis not present

## 2023-07-11 DIAGNOSIS — L501 Idiopathic urticaria: Secondary | ICD-10-CM | POA: Diagnosis not present

## 2023-07-11 DIAGNOSIS — R21 Rash and other nonspecific skin eruption: Secondary | ICD-10-CM | POA: Diagnosis not present

## 2023-07-11 DIAGNOSIS — L509 Urticaria, unspecified: Secondary | ICD-10-CM

## 2023-07-11 MED ORDER — OMALIZUMAB 150 MG ~~LOC~~ SOLR
300.0000 mg | SUBCUTANEOUS | Status: DC
  Administered 2023-07-11: 300 mg via SUBCUTANEOUS

## 2023-07-11 MED ORDER — PREDNISONE 10 MG PO TABS: ORAL_TABLET | ORAL | 0 refills | Status: DC

## 2023-07-11 NOTE — Patient Instructions (Addendum)
 Rash/hives Take prednisone  20mg  (2 tablets of 10mg ) once a day for 5 days then 1 tablet once a day for 5 days.  Xolair  300mg  injection sample given today. Continue every 4 weeks at our [place] office.  Tammy will be in touch with you regarding coverage.  For mild symptoms you can take over the counter antihistamines such as Benadryl 1-2 tablets = 25-50mg  and monitor symptoms closely. If symptoms worsen or if you have severe symptoms including breathing issues, throat closure, significant swelling, whole body hives, severe diarrhea and vomiting, lightheadedness then inject epinephrine  and seek immediate medical care afterwards. Emergency action plan in place. Consent was signed.   Keep track of rashes and take pictures. Continue zyrtec  10mg  twice a day and may take up to 20mg  twice a day.  If symptoms are not controlled or causes drowsiness let us  know. Continue Pepcid  (famotidine ) 20mg  twice a day.  Continue Singulair  (montelukast ) 10mg  daily at night. Avoid the following potential triggers: alcohol, tight clothing, NSAIDs, hot showers and getting overheated. Continue proper skin care.   Food allergy Continue to avoid shellfish and red meat.  Okay to reintroduce diary back into diet. For mild symptoms you can take over the counter antihistamines such as Benadryl 1-2 tablets = 25-50mg  and monitor symptoms closely. If symptoms worsen or if you have severe symptoms including breathing issues, throat closure, significant swelling, whole body hives, severe diarrhea and vomiting, lightheadedness then inject epinephrine  and seek immediate medical care afterwards. Emergency action plan in place.  Check website about your medications whether they are on the list or not.  [email or web address]  Return in about 2 months (around 09/08/2023). Or sooner if needed.

## 2023-07-11 NOTE — Progress Notes (Signed)
 Follow Up Note  RE: RAMSAY Glass MRN: [id number] DOB: 09-Oct-1955 Date of Office Visit: 07/11/2023  Referring provider: Cleotilde Oneil FALCON, MD Primary care provider: Cleotilde Oneil FALCON, MD  Chief Complaint: Urticaria (Hives came back )  History of Present Illness: I had the pleasure of seeing [name] for a [facility]  on 07/11/2023. He is a 68 y.o. male, who is being followed for rash/hives. His previous allergy office visit was on 06/27/2023 with Dr. Luke. Today is a new complaint visit of breaking out . He is accompanied today by his wife who provided/contributed to the history.   Discussed the use of AI scribe software for clinical note transcription with the patient, who gave verbal consent to proceed.  Adam Glass is a 68 year old male with recurrent hives who presents with worsening rash and itching.  He has been experiencing a recurrent rash that initially resolved after receiving a steroid injection and oral steroid medication. The rash cleared within two to three days, but symptoms returned approximately two days after completing the medication course. Over the past week, the rash has progressively worsened, with initial itching followed by the appearance of spots. It has spread further down his legs, affecting previously unaffected areas.  Since his knee surgery, he has undergone three rounds of steroids since November, which temporarily resolve the symptoms, but the rash returns once the medication is stopped. He is currently taking Zyrtec  10mg  BID, famotidine , one pill twice daily, and Singulair  at bedtime, but these medications have not made a significant difference. No fever, chills, or gastrointestinal symptoms such as stomachache, vomiting, or diarrhea are present. The itching is severe and impacts his sleep.  He has a history of a shellfish allergy, which previously caused his tongue to swell. He denies any dietary intake of shellfish  or red meat, although he has consumed some dairy products without noticing any worsening of symptoms.      Assessment and Plan: [name] is a 68 y.o. male with: Rash and other nonspecific skin eruption Urticaria Past history - Recurrent, pruritic rash on torso, back, and waist following left knee replacement surgery. No prior history of similar rash. Recent changes in multiple medications due to diverticulitis and knee surgery. Improved with systemic steroids. 2025 labs unremarkable except shellfish panel borderline positive and alpha gal positive. Skin biopsy ? Drug eruption vs early erythema multiforme. Interim history -  Recurrent rash with itching, responsive to systemic steroids but recurs after discontinuation. No improvement with Zyrtec , Famotidine , and Montelukast . No clear trigger identified.  Will do a trial of Xolair  injection given persistent symptoms and resolution with steroids.  Take prednisone  20mg  (2 tablets of 10mg ) once a day for 5 days then 1 tablet once a day for 5 days.  Xolair  300mg  injection sample given today. Continue every 4 weeks at our [place] office.  Tammy will be in touch with you regarding coverage.  For mild symptoms you can take over the counter antihistamines such as Benadryl 1-2 tablets = 25-50mg  and monitor symptoms closely. If symptoms worsen or if you have severe symptoms including breathing issues, throat closure, significant swelling, whole body hives, severe diarrhea and vomiting, lightheadedness then inject epinephrine  and seek immediate medical care afterwards. Emergency action plan in place. Consent was signed.  Keep track of rashes and take pictures. Continue zyrtec  10mg  twice a day and may take up to 20mg  twice a day.  If symptoms are not controlled  or causes drowsiness let us  know. Continue Pepcid  (famotidine ) 20mg  twice a day.  Continue Singulair  (montelukast ) 10mg  daily at night. Avoid the following potential triggers: alcohol, tight clothing,  NSAIDs, hot showers and getting overheated. Continue proper skin care.    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 Tolerates finned fish. 2025 bloodwork borderline positive to shellfish and positive to alpha gal. Interim history -  avoiding red meat and dairy. Continue to avoid shellfish and red meat.  Okay to reintroduce diary back into diet. For mild symptoms you can take over the counter antihistamines such as Benadryl 1-2 tablets = 25-50mg  and monitor symptoms closely. If symptoms worsen or if you have severe symptoms including breathing issues, throat closure, significant swelling, whole body hives, severe diarrhea and vomiting, lightheadedness then inject epinephrine  and seek immediate medical care afterwards. Emergency action plan in place.  Check website about your medications whether they are on the list or not.  [email or web address]  Return in about 2 months (around 09/08/2023).  Meds ordered this encounter  Medications   omalizumab  (XOLAIR ) injection 300 mg   predniSONE  (DELTASONE ) 10 MG tablet    Sig: Take 2 tablets once a day for 5 days then 1 tablet once a day for 5 days.    Dispense:  15 tablet    Refill:  0   Lab Orders  No laboratory test(s) ordered today    Diagnostics: None.   Medication List:  Current Outpatient Medications  Medication Sig Dispense Refill   acetaminophen  (TYLENOL ) 500 MG tablet Take 2 tablets (1,000 mg total) by mouth every 8 (eight) hours. 30 tablet 0   Alcaftadine (LASTACAFT OP) Apply to eye.     ALPRAZolam (XANAX) 0.5 MG tablet Take 0.5 mg by mouth at bedtime as needed.     bisoprolol  (ZEBETA ) 5 MG tablet Take 5 mg by mouth every evening.     cetirizine  (ZYRTEC  ALLERGY) 10 MG tablet Take 1 tablet (10 mg total) by mouth 2 (two) times daily. 60 tablet 3   clotrimazole-betamethasone  (LOTRISONE) cream Apply topically 2  (two) times daily.     docusate (COLACE) 50 MG/5ML liquid Take by mouth daily.     EPINEPHrine  0.3 mg/0.3 mL IJ SOAJ injection Inject 0.3 mg into the muscle as needed for anaphylaxis. 2 each 1   famotidine  (PEPCID ) 20 MG tablet Take 1 tablet (20 mg total) by mouth 2 (two) times daily. 60 tablet 2   fluticasone (FLONASE) 50 MCG/ACT nasal spray Place 1 spray into both nostrils daily as needed (allergies.).      hydrALAZINE  (APRESOLINE ) 50 MG tablet Take 50 mg by mouth in the morning and at bedtime.     HYDRALAZINE  HCL PO Take by mouth.     montelukast  (SINGULAIR ) 10 MG tablet Take 1 tablet (10 mg total) by mouth at bedtime. 30 tablet 3   Na Sulfate-K Sulfate-Mg Sulf (SUPREP BOWEL PREP KIT) 17.5-3.13-1.6 GM/177ML SOLN Take 1 kit by mouth as directed. 324 mL 0   olmesartan (BENICAR) 40 MG tablet Take 40 mg by mouth every evening.      olopatadine (PATANOL) 0.1 % ophthalmic solution Place 1 drop into both eyes daily as needed for allergies (dryness).     potassium chloride  (MICRO-K ) 10 MEQ CR capsule Take 10 mEq by mouth daily.      predniSONE  (DELTASONE ) 10 MG tablet Take 2 tablets once a day for 5 days then 1 tablet  once a day for 5 days. 15 tablet 0   tamsulosin (FLOMAX) 0.4 MG CAPS capsule Take 0.4 mg by mouth daily.     temazepam (RESTORIL) 30 MG capsule Take 30 mg by mouth at bedtime.     triamcinolone  ointment (KENALOG ) 0.1 % Apply 1 Application topically 2 (two) times daily as needed (rash flare). Do not use on the face, neck, armpits or groin area. Do not use more than 3 weeks in a row. 30 g 1   zolpidem (AMBIEN) 5 MG tablet Take 5 mg by mouth at bedtime as needed.     Current Facility-Administered Medications  Medication Dose Route Frequency Provider Last Rate Last Admin   omalizumab  (XOLAIR ) injection 300 mg  300 mg Subcutaneous Q28 days Luke Orlan HERO, DO   300 mg at 07/11/23 1012   Allergies: Allergies  Allergen Reactions   Shellfish Allergy Shortness Of Breath and Swelling     Throat/tongue swells   Alpha-Gal    I reviewed his past medical history, social history, family history, and environmental history and no significant changes have been reported from his previous visit.  Review of Systems  Constitutional:  Negative for appetite change, chills, fever and unexpected weight change.  HENT:  Negative for congestion and rhinorrhea.   Eyes:  Negative for itching.  Respiratory:  Negative for cough, chest tightness, shortness of breath and wheezing.   Cardiovascular:  Negative for chest pain.  Gastrointestinal:  Negative for abdominal pain.  Genitourinary:  Negative for difficulty urinating.  Skin:  Positive for rash.  Allergic/Immunologic: Positive for food allergies.  Neurological:  Negative for headaches.    Objective: BP 130/88   Pulse (!) 55   Temp 97.9 F (36.6 C) (Temporal)   Resp 14   SpO2 96%  There is no height or weight on file to calculate BMI. Physical Exam Vitals and nursing note reviewed.  Constitutional:      Appearance: Normal appearance. He is well-developed.  HENT:     Head: Normocephalic and atraumatic.     Right Ear: Tympanic membrane and external ear normal.     Left Ear: Tympanic membrane and external ear normal.     Nose: Nose normal.     Mouth/Throat:     Mouth: Mucous membranes are moist.     Pharynx: Oropharynx is clear.  Eyes:     Conjunctiva/sclera: Conjunctivae normal.  Cardiovascular:     Rate and Rhythm: Normal rate and regular rhythm.     Heart sounds: Normal heart sounds. No murmur heard.    No friction rub. No gallop.  Pulmonary:     Effort: Pulmonary effort is normal.     Breath sounds: Normal breath sounds. No wheezing, rhonchi or rales.  Musculoskeletal:     Cervical back: Neck supple.  Skin:    General: Skin is warm.     Findings: Rash present.     Comments: Diffuse circular erythematous patches on torso which are blanchable.   Neurological:     Mental Status: He is alert and oriented to person,  place, and time.  Psychiatric:        Behavior: Behavior normal.    Previous notes and tests were reviewed. The plan was reviewed with the patient/family, and all questions/concerned were addressed.  It was my pleasure to see Adam Glass today and participate in his care. Please feel free to contact me with any questions or concerns.  Sincerely,  Orlan Luke, DO Allergy & Immunology  [facility] [place]  [place] office: [phone] [facility] office: [phone]

## 2023-07-17 ENCOUNTER — Telehealth: Payer: Self-pay | Admitting: Gastroenterology

## 2023-07-17 NOTE — Telephone Encounter (Signed)
 Patient describes allergic reaction to post operative medications recently. States he is on multiple antihistamines, prednisone  and now xolair  injections and wants to know if these medications will interfere with endoscopy/colonoscopy. We discussed that these medications should not interfere with his procedure. However, after more discussion, patient is uneasy about moving forward with endoscopy/colonoscopy at the currently scheduled 07/24/23 date having just had allergic reaction with recurrent hives and being on multiple medications. Therefore, we have rescheduled his procedures to 08/16/23 which he is much more comfortable with.

## 2023-07-17 NOTE — Telephone Encounter (Signed)
 Patient called stated he had a knee procedure and it caused him to have an allergic reaction and broke out in hives. He is now on anti- hive injections. He is scheduled for a double procedure on 07/24/23 would like to know if he can proceed. Please advise.

## 2023-07-18 DIAGNOSIS — Z96652 Presence of left artificial knee joint: Secondary | ICD-10-CM | POA: Diagnosis not present

## 2023-07-20 DIAGNOSIS — M5451 Vertebrogenic low back pain: Secondary | ICD-10-CM | POA: Diagnosis not present

## 2023-07-20 DIAGNOSIS — M5416 Radiculopathy, lumbar region: Secondary | ICD-10-CM | POA: Diagnosis not present

## 2023-07-24 ENCOUNTER — Encounter: Payer: Medicare HMO | Admitting: Gastroenterology

## 2023-07-25 ENCOUNTER — Other Ambulatory Visit (HOSPITAL_COMMUNITY): Payer: Self-pay

## 2023-07-25 ENCOUNTER — Telehealth: Payer: Self-pay | Admitting: *Deleted

## 2023-07-25 MED ORDER — OMALIZUMAB 150 MG/ML ~~LOC~~ SOSY: 300.0000 mg | PREFILLED_SYRINGE | SUBCUTANEOUS | 11 refills | Status: DC

## 2023-07-25 NOTE — Telephone Encounter (Signed)
 Called patient and discussed PAP vs PPP/OOP for Xolair he will not qualify for PAP so we will send his Rx to [facility] for them to send out to our clinic for admin. He already has appt for next injection

## 2023-07-25 NOTE — Telephone Encounter (Signed)
-----   Message from Ellamae Sia sent at 07/11/2023 12:50 PM EST ----- Please start PA for Xolair 300mg  every 4 weeks for hives. Sample dose given today. Will be getting it at the [facility] office. Thank you.

## 2023-07-26 ENCOUNTER — Ambulatory Visit: Payer: No Typology Code available for payment source | Admitting: Allergy

## 2023-07-27 ENCOUNTER — Other Ambulatory Visit: Payer: Self-pay

## 2023-07-27 ENCOUNTER — Other Ambulatory Visit (HOSPITAL_COMMUNITY): Payer: Self-pay

## 2023-07-27 MED ORDER — XOLAIR 300 MG/2ML ~~LOC~~ SOSY
300.0000 mg | PREFILLED_SYRINGE | SUBCUTANEOUS | 11 refills | Status: AC
  Filled 2023-07-27: qty 2, 28d supply, fill #0
  Filled 2023-08-18: qty 2, 28d supply, fill #1
  Filled 2023-09-21: qty 2, 28d supply, fill #2
  Filled 2023-10-20: qty 2, 28d supply, fill #3
  Filled 2023-11-27: qty 2, 28d supply, fill #4
  Filled 2023-12-22: qty 2, 28d supply, fill #5
  Filled 2024-01-26: qty 2, 28d supply, fill #6
  Filled 2024-02-19 – 2024-03-04 (×2): qty 2, 28d supply, fill #7
  Filled 2024-03-29: qty 2, 28d supply, fill #8
  Filled 2024-04-24: qty 2, 28d supply, fill #9
  Filled 2024-06-26: qty 2, 28d supply, fill #10

## 2023-07-27 NOTE — Progress Notes (Signed)
 Specialty Pharmacy Initial Fill Coordination Note  Adam Glass is a 68 y.o. male contacted today regarding initial fill of specialty medication(s) Omalizumab Geoffry [place])   Patient requested Courier to Provider Office   Delivery date: 08/02/23   Verified address: [address] [place] [address]   Medication will be filled on 02/25.   Patient is aware of $843.71 copayment.

## 2023-07-27 NOTE — Addendum Note (Signed)
 Addended by: Devoria Glassing on: 07/27/2023 11:51 AM   Modules accepted: Orders

## 2023-07-27 NOTE — Progress Notes (Signed)
 Specialty Pharmacy Initiation Note   Adam Glass is a 68 y.o. male who will be followed by the specialty pharmacy service for RxSp Allergy    Review of administration, indication, effectiveness, safety, potential side effects, storage/disposable, and missed dose instructions occurred today for patient's specialty medication(s) Omalizumab (Xolair)     Patient/Caregiver did not have any additional questions or concerns.   Patient's therapy is appropriate to: Continue    Goals Addressed             This Visit's Progress    Reduce signs and symptoms       Patient is on track. Patient will maintain adherence         Otto Herb Specialty Pharmacist

## 2023-08-01 ENCOUNTER — Other Ambulatory Visit: Payer: Self-pay

## 2023-08-08 ENCOUNTER — Ambulatory Visit: Payer: No Typology Code available for payment source

## 2023-08-08 ENCOUNTER — Other Ambulatory Visit (HOSPITAL_COMMUNITY): Payer: Self-pay

## 2023-08-08 DIAGNOSIS — L501 Idiopathic urticaria: Secondary | ICD-10-CM

## 2023-08-08 MED ORDER — OMALIZUMAB 300 MG/2  ML ~~LOC~~ SOSY
300.0000 mg | PREFILLED_SYRINGE | SUBCUTANEOUS | Status: DC
  Administered 2023-08-08: 300 mg via SUBCUTANEOUS

## 2023-08-10 DIAGNOSIS — M5416 Radiculopathy, lumbar region: Secondary | ICD-10-CM | POA: Diagnosis not present

## 2023-08-14 ENCOUNTER — Telehealth: Payer: Self-pay | Admitting: Gastroenterology

## 2023-08-14 ENCOUNTER — Encounter: Payer: Self-pay | Admitting: Certified Registered Nurse Anesthetist

## 2023-08-14 NOTE — Telephone Encounter (Signed)
 Inbound call from patient, states he is no longer having dysphagia and would like to know if he can cancel his endo and only have a colonoscopy. Patient would like to speak to a nurse in regards. Procedure is scheduled for Wednesday. 3/12.

## 2023-08-14 NOTE — Telephone Encounter (Signed)
 Called and left patient a detailed voicemail informing him that EGD is also to evaluate for his weight loss. I advised pt that it will be his decision if he chooses to have continue with EGD and colonoscopy. I advised patient to call back if he has any questions.

## 2023-08-16 ENCOUNTER — Ambulatory Visit: Payer: Medicare HMO | Admitting: Gastroenterology

## 2023-08-16 ENCOUNTER — Encounter: Payer: Self-pay | Admitting: Gastroenterology

## 2023-08-16 VITALS — BP 178/92 | HR 61 | Temp 98.4°F | Resp 12 | Ht 76.0 in | Wt 193.0 lb

## 2023-08-16 DIAGNOSIS — G4733 Obstructive sleep apnea (adult) (pediatric): Secondary | ICD-10-CM | POA: Diagnosis not present

## 2023-08-16 DIAGNOSIS — Z1211 Encounter for screening for malignant neoplasm of colon: Secondary | ICD-10-CM

## 2023-08-16 DIAGNOSIS — D123 Benign neoplasm of transverse colon: Secondary | ICD-10-CM

## 2023-08-16 DIAGNOSIS — R1310 Dysphagia, unspecified: Secondary | ICD-10-CM

## 2023-08-16 DIAGNOSIS — Z860100 Personal history of colon polyps, unspecified: Secondary | ICD-10-CM | POA: Diagnosis not present

## 2023-08-16 DIAGNOSIS — K5730 Diverticulosis of large intestine without perforation or abscess without bleeding: Secondary | ICD-10-CM

## 2023-08-16 DIAGNOSIS — D125 Benign neoplasm of sigmoid colon: Secondary | ICD-10-CM

## 2023-08-16 DIAGNOSIS — K635 Polyp of colon: Secondary | ICD-10-CM

## 2023-08-16 DIAGNOSIS — Z860101 Personal history of adenomatous and serrated colon polyps: Secondary | ICD-10-CM | POA: Diagnosis not present

## 2023-08-16 DIAGNOSIS — I4891 Unspecified atrial fibrillation: Secondary | ICD-10-CM | POA: Diagnosis not present

## 2023-08-16 DIAGNOSIS — K640 First degree hemorrhoids: Secondary | ICD-10-CM | POA: Diagnosis not present

## 2023-08-16 DIAGNOSIS — R634 Abnormal weight loss: Secondary | ICD-10-CM

## 2023-08-16 DIAGNOSIS — D122 Benign neoplasm of ascending colon: Secondary | ICD-10-CM

## 2023-08-16 DIAGNOSIS — K648 Other hemorrhoids: Secondary | ICD-10-CM

## 2023-08-16 MED ORDER — SODIUM CHLORIDE 0.9 % IV SOLN
500.0000 mL | Freq: Once | INTRAVENOUS | Status: DC
Start: 2023-08-16 — End: 2023-08-16

## 2023-08-16 NOTE — Progress Notes (Signed)
 1437 BP 197/110, Labetalol given IV, MD update, vss

## 2023-08-16 NOTE — Progress Notes (Signed)
 Report given to PACU, vss

## 2023-08-16 NOTE — Progress Notes (Signed)
 BP 206/113, Labetalol given IV, MD update, vss

## 2023-08-16 NOTE — Progress Notes (Signed)
 Called to room to assist during endoscopic procedure.  Patient ID and intended procedure confirmed with present staff. Received instructions for my participation in the procedure from the performing physician.

## 2023-08-16 NOTE — Op Note (Signed)
  [facility] Patient Name: Adam Glass Procedure Date: 08/16/2023 2:11 PM MRN: [id number] Endoscopist: Doristine Locks , MD, [id number] Age: 68 Referring MD:  Date of Birth: [birth year] Gender: Male Account #: [id number] Procedure:                Colonoscopy Indications:              High risk colon cancer surveillance: Personal                            history of colonic polyps                           History of diverticulitis, including hospital                            admission in 01/2023 with acute sigmoid                            diverticulitis with concern for early perforation,                            managed non-operatively.                           Last colonoscopy was 10/20/2017 at [facility] and                            notable for medium cecal polyp (adenoma), small                            ascending polyp (adenoma), small hepatic flexure                            polyp x 3 (SSP x 2, features of TSA x 1), small                            transverse colon polyp (benign), diverticulosis in                            the transverse and ascending colon, small internal                            hemorrhoids. Medicines:                Monitored Anesthesia Care Procedure:                Pre-Anesthesia Assessment:                           - Prior to the procedure, a History and Physical                            was performed, and patient medications and                            allergies were reviewed. The patient's tolerance  of                            previous anesthesia was also reviewed. The risks                            and benefits of the procedure and the sedation                            options and risks were discussed with the patient.                            All questions were answered, and informed consent                            was obtained. Prior Anticoagulants: The patient has                            taken no  anticoagulant or antiplatelet agents. ASA                            Grade Assessment: II - A patient with mild systemic                            disease. After reviewing the risks and benefits,                            the patient was deemed in satisfactory condition to                            undergo the procedure.                           After obtaining informed consent, the colonoscope                            was passed under direct vision. Throughout the                            procedure, the patient's blood pressure, pulse, and                            oxygen saturations were monitored continuously. The                            Olympus CF-HQ190L ([id number]) Colonoscope was                            introduced through the anus and advanced to the the                            terminal ileum. The colonoscopy was performed  without difficulty. The patient tolerated the                            procedure well. The quality of the bowel                            preparation was good. The terminal ileum, ileocecal                            valve, appendiceal orifice, and rectum were                            photographed. Scope In: 2:20:36 PM Scope Out: 2:41:06 PM Scope Withdrawal Time: 0 hours 18 minutes 46 seconds  Total Procedure Duration: 0 hours 20 minutes 30 seconds  Findings:                 The perianal and digital rectal examinations were                            normal.                           Three sessile polyps were found in the transverse                            colon and ascending colon. The polyps were 3 to 8                            mm in size. These polyps were removed with a cold                            snare. Resection and retrieval were complete.                            Estimated blood loss was minimal.                           A 3 mm polyp was found in the sigmoid colon. The                             polyp was sessile. The polyp was removed with a                            cold snare. Resection and retrieval were complete.                            Estimated blood loss was minimal.                           Multiple large-mouthed and small-mouthed                            diverticula were found in the sigmoid colon,  descending colon, transverse colon and ascending                            colon.                           Non-bleeding internal hemorrhoids were found during                            retroflexion. The hemorrhoids were small.                           The terminal ileum appeared normal. Complications:            No immediate complications. Estimated Blood Loss:     Estimated blood loss was minimal. Impression:               - Three 3 to 8 mm polyps in the transverse colon                            and in the ascending colon, removed with a cold                            snare. Resected and retrieved.                           - One 3 mm polyp in the sigmoid colon, removed with                            a cold snare. Resected and retrieved.                           - Diverticulosis in the sigmoid colon, in the                            descending colon, in the transverse colon and in                            the ascending colon.                           - Non-bleeding internal hemorrhoids.                           - The examined portion of the ileum was normal. Recommendation:           - Patient has a contact number available for                            emergencies. The signs and symptoms of potential                            delayed complications were discussed with the                            patient. Return to normal  activities tomorrow.                            Written discharge instructions were provided to the                            patient.                           - Resume previous diet.                            - Continue present medications.                           - Await pathology results.                           - Repeat colonoscopy for surveillance based on                            pathology results.                           - Return to GI office PRN. Doristine Locks, MD 08/16/2023 2:48:41 PM

## 2023-08-16 NOTE — Patient Instructions (Signed)
 Please read handouts provided. Continue present medications. Await pathology results. Return to GI office as needed. Resume previous diet.   YOU HAD AN ENDOSCOPIC PROCEDURE TODAY AT THE [facility]:   Refer to the procedure report that was given to you for any specific questions about what was found during the examination.  If the procedure report does not answer your questions, please call your gastroenterologist to clarify.  If you requested that your care partner not be given the details of your procedure findings, then the procedure report has been included in a sealed envelope for you to review at your convenience later.  YOU SHOULD EXPECT: Some feelings of bloating in the abdomen. Passage of more gas than usual.  Walking can help get rid of the air that was put into your GI tract during the procedure and reduce the bloating. If you had a lower endoscopy (such as a colonoscopy or flexible sigmoidoscopy) you may notice spotting of blood in your stool or on the toilet paper. If you underwent a bowel prep for your procedure, you may not have a normal bowel movement for a few days.  Please Note:  You might notice some irritation and congestion in your nose or some drainage.  This is from the oxygen used during your procedure.  There is no need for concern and it should clear up in a day or so.  SYMPTOMS TO REPORT IMMEDIATELY:  Following lower endoscopy (colonoscopy or flexible sigmoidoscopy):  Excessive amounts of blood in the stool  Significant tenderness or worsening of abdominal pains  Swelling of the abdomen that is new, acute  Fever of 100F or higher.  For urgent or emergent issues, a gastroenterologist can be reached at any hour by calling [phone]. Do not use MyChart messaging for urgent concerns.    DIET:  We do recommend a small meal at first, but then you may proceed to your regular diet.  Drink plenty of fluids but you should avoid alcoholic beverages for 24  hours.  ACTIVITY:  You should plan to take it easy for the rest of today and you should NOT DRIVE or use heavy machinery until tomorrow (because of the sedation medicines used during the test).    FOLLOW UP: Our staff will call the number listed on your records the next business day following your procedure.  We will call around 7:15- 8:00 am to check on you and address any questions or concerns that you may have regarding the information given to you following your procedure. If we do not reach you, we will leave a message.     If any biopsies were taken you will be contacted by phone or by letter within the next 1-3 weeks.  Please call us at [phone] if you have not heard about the biopsies in 3 weeks.    SIGNATURES/CONFIDENTIALITY: You and/or your care partner have signed paperwork which will be entered into your electronic medical record.  These signatures attest to the fact that that the information above on your After Visit Summary has been reviewed and is understood.  Full responsibility of the confidentiality of this discharge information lies with you and/or your care-partner.

## 2023-08-16 NOTE — Progress Notes (Signed)
 GASTROENTEROLOGY PROCEDURE H&P NOTE   Primary Care Physician: Danella Penton, MD    Reason for Procedure:  Diverticulitis, history of colon polyps  Plan:    Colonoscopy  Patient is appropriate for endoscopic procedure(s) in the ambulatory ([facility]) setting.  The nature of the procedure, as well as the risks, benefits, and alternatives were carefully and thoroughly reviewed with the patient. Ample time for discussion and questions allowed. The patient understood, was satisfied, and agreed to proceed.     HPI: Adam Glass is a 68 y.o. male who presents for colonoscopy for evaluation of recent hospitalization for diverticulitis, along with surveillance due to prior history of colon polyps.  He was originally scheduled for EGD as well due to dysphagia, but those symptoms have completely resolved.  Now tolerating all p.o. intake without any limitations.  Weight is stable, and he would like to hold off on EGD and proceed with colonoscopy alone.  Hospitalized 8/23-28 with acute sigmoid diverticulitis with concern for early perforation.  General Surgery consulted and recommended medical management.  Inpatient GI service was not consulted.  Was treated with IV ABX and transition to Augmentin x 14 days with recommendation for outpatient GI follow-up. - CT: Descending and sigmoid diverticulosis with focal wall thickening and surrounding inflammation of the proximal sigmoid colon consistent with acute diverticulitis.  Single locule of extraluminal gas along anterior aspect of colon concerning for early perforation.  No abscess.   Prior to Aug, had diverticulitis flare requiring hospitalization 15+ years ago. Has had mild flares over the years, treated with short courses of Cipro/Flagyl by PCM. Previously followed with GI at [facility], last seen years ago.    Last colonoscopy was 10/20/2017 by Dr. 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   a.) surgical PCR (+) 03/28/2023 prior to LEFT TKR   OSA on CPAP    Prostate cancer (HCC)    Sleep apnea    uses CPAP   Spondylosis of lumbar region without myelopathy or radiculopathy    Tubular adenoma    Urticaria     Past Surgical History:  Procedure Laterality Date   COLONOSCOPY     COLONOSCOPY WITH PROPOFOL N/A 10/20/2017   Procedure: COLONOSCOPY WITH PROPOFOL;  Surgeon: Scot Jun, MD;  Location: [facility] ENDOSCOPY;  Service: Endoscopy;  Laterality: N/A;   EYE SURGERY Left    Retina Tear, repaired with laser   KNEE ARTHROSCOPY Left    KNEE ARTHROSCOPY Left 11/09/2018   Procedure: ARTHROSCOPY KNEE LEFT;  Surgeon: Donato Heinz, MD;  Location: [facility] ORS;  Service: Orthopedics;  Laterality: Left;   ROTATOR CUFF REPAIR Left    TOTAL KNEE ARTHROPLASTY Left 04/03/2023   Procedure: TOTAL KNEE ARTHROPLASTY;  Surgeon: Reinaldo Berber, MD;  Location: [facility] ORS;  Service: Orthopedics;  Laterality: Left;   VASECTOMY      Prior to Admission medications   Medication Sig Start Date  End Date Taking? Authorizing Provider  acetaminophen (TYLENOL) 500 MG tablet Take 2 tablets (1,000 mg total) by mouth every 8 (eight) hours. 04/04/23  Yes Evon Slack, PA-C  bisoprolol (ZEBETA) 5 MG tablet Take 5 mg by mouth every evening. 12/27/21  Yes [provider]  cetirizine (ZYRTEC ALLERGY) 10 MG tablet Take 1 tablet (10 mg total) by mouth 2 (two) times daily. 06/27/23  Yes Ellamae Sia, DO  docusate (COLACE) 50 MG/5ML liquid  Take by mouth daily.   Yes [provider]  famotidine (PEPCID) 20 MG tablet Take 1 tablet (20 mg total) by mouth 2 (two) times daily. 06/12/23  Yes Ellamae Sia, DO  hydrALAZINE (APRESOLINE) 50 MG tablet Take 50 mg by mouth in the morning and at bedtime.   Yes [provider]  montelukast (SINGULAIR) 10 MG tablet Take 1 tablet (10 mg total) by mouth at bedtime. 06/27/23  Yes Ellamae Sia, DO  olmesartan (BENICAR) 40 MG tablet Take 40 mg by mouth every evening.    Yes [provider]  omalizumab [facility]) 300 MG/2  ML prefilled syringe Inject 300 mg into the skin every 28 (twenty-eight) days. 07/27/23  Yes Ellamae Sia, DO  potassium chloride (MICRO-K) 10 MEQ CR capsule Take 10 mEq by mouth daily.  02/16/18  Yes [provider]  clotrimazole-betamethasone (LOTRISONE) cream Apply topically 2 (two) times daily. Patient not taking: Reported on 08/16/2023 06/05/23   [provider]  EPINEPHrine 0.3 mg/0.3 mL IJ SOAJ injection Inject 0.3 mg into the muscle as needed for anaphylaxis. Patient not taking: Reported on 08/16/2023 06/12/23   Ellamae Sia, DO  fluticasone [facility]) 50 MCG/ACT nasal spray Place 1 spray into both nostrils daily as needed (allergies.).     [provider]  olopatadine (PATANOL) 0.1 % ophthalmic solution Place 1 drop into both eyes daily as needed for allergies (dryness).    [provider]  tamsulosin (FLOMAX) 0.4 MG CAPS capsule Take 0.4 mg by mouth daily. Patient not taking: Reported on 08/16/2023 05/15/23   [provider]  temazepam (RESTORIL) 30 MG capsule Take 30 mg by mouth at bedtime. 05/16/23 07/15/23  [provider]  triamcinolone ointment (KENALOG) 0.1 % Apply 1 Application topically 2 (two) times daily as needed (rash flare). Do not use on the face, neck, armpits or groin area. Do not use more than 3 weeks in a row. 06/12/23   Ellamae Sia, DO    Current Outpatient Medications  Medication Sig Dispense  Refill   acetaminophen (TYLENOL) 500 MG tablet Take 2 tablets (1,000 mg total) by mouth every 8 (eight) hours. 30 tablet 0   bisoprolol (ZEBETA) 5 MG tablet Take 5 mg by mouth every evening.     cetirizine (ZYRTEC ALLERGY) 10 MG tablet Take 1 tablet (10 mg total) by mouth 2 (two) times daily. 60 tablet 3   docusate (COLACE) 50 MG/5ML liquid Take by mouth daily.     famotidine (PEPCID) 20 MG tablet Take 1 tablet (20 mg total) by mouth 2 (two) times daily. 60 tablet 2   hydrALAZINE (APRESOLINE) 50 MG tablet Take 50 mg by mouth in the morning and at bedtime.     montelukast (SINGULAIR) 10 MG tablet Take 1 tablet (10 mg total) by mouth at bedtime. 30 tablet 3   olmesartan (BENICAR) 40 MG tablet Take 40 mg by mouth every evening.      omalizumab (XOLAIR) 300 MG/2  ML prefilled syringe Inject 300 mg  into the skin every 28 (twenty-eight) days. 2 mL 11   potassium chloride (MICRO-K) 10 MEQ CR capsule Take 10 mEq by mouth daily.      clotrimazole-betamethasone (LOTRISONE) cream Apply topically 2 (two) times daily. (Patient not taking: Reported on 08/16/2023)     EPINEPHrine 0.3 mg/0.3 mL IJ SOAJ injection Inject 0.3 mg into the muscle as needed for anaphylaxis. (Patient not taking: Reported on 08/16/2023) 2 each 1   fluticasone (FLONASE) 50 MCG/ACT nasal spray Place 1 spray into both nostrils daily as needed (allergies.).      olopatadine (PATANOL) 0.1 % ophthalmic solution Place 1 drop into both eyes daily as needed for allergies (dryness).     tamsulosin (FLOMAX) 0.4 MG CAPS capsule Take 0.4 mg by mouth daily. (Patient not taking: Reported on 08/16/2023)     temazepam (RESTORIL) 30 MG capsule Take 30 mg by mouth at bedtime.     triamcinolone ointment (KENALOG) 0.1 % Apply 1 Application topically 2 (two) times daily as needed (rash flare). Do not use on the face, neck, armpits or groin area. Do not use more than 3 weeks in a row. 30 g 1   Current Facility-Administered Medications  Medication Dose Route  Frequency Provider Last Rate Last Admin   0.9 %  sodium chloride infusion  500 mL Intravenous Once Anes Rigel V, DO        Allergies as of 08/16/2023 - Review Complete 08/16/2023  Allergen Reaction Noted   Shellfish allergy Shortness Of Breath and Swelling 10/19/2017   Alpha-gal Hives 06/27/2023   Oxycodone Hives and Itching 08/16/2023    Family History  Problem Relation Age of Onset   Heart attack Father    Diabetes Brother    Liver disease Neg Hx    Colon cancer Neg Hx    Esophageal cancer Neg Hx    Rectal cancer Neg Hx    Stomach cancer Neg Hx     Social History   Socioeconomic History   Marital status: Married    Spouse name: Not on file   Number of children: 2   Years of education: Not on file   Highest education level: Not on file  Occupational History   Occupation: retired  Tobacco Use   Smoking status: Never   Smokeless tobacco: Never  Vaping Use   Vaping status: Never Used  Substance and Sexual Activity   Alcohol use: Yes    Comment: occasional   Drug use: Never   Sexual activity: Yes    Birth control/protection: None  Other Topics Concern   Not on file  Social History Narrative   Not on file   Social Drivers of Health   Financial Resource Strain: Low Risk  (05/16/2023)   Received from [facility]   Overall Financial Resource Strain (CARDIA)    Difficulty of Paying Living Expenses: Not hard at all  Food Insecurity: No Food Insecurity (05/16/2023)   Received from [facility]   Hunger Vital Sign    Worried About Running Out of Food in the Last Year: Never true    Ran Out of Food in the Last Year: Never true  Transportation Needs: No Transportation Needs (05/16/2023)   Received from [facility] - Transportation    In the past 12 months, has lack of transportation kept you from medical appointments or from getting medications?: No    Lack of Transportation (Non-Medical): No   Physical Activity: Not on file  Stress: Not on file  Social Connections: Not on file  Intimate Partner Violence: Not At Risk (04/03/2023)   Humiliation, Afraid, Rape, and Kick questionnaire    Fear of Current or Ex-Partner: No    Emotionally Abused: No    Physically Abused: No    Sexually Abused: No    Physical Exam: Vital signs in last 24 hours: @BP  (!) 194/98   Pulse (!) 58   Temp 98.4 F (36.9 C) (Temporal)   Resp 10   Ht 6\' 4"  (1.93 m)   Wt 193 lb (87.5 kg)   SpO2 96%   BMI 23.49 kg/m  GEN: NAD EYE: Sclerae anicteric ENT: MMM CV: Non-tachycardic Pulm: CTA b/l GI: Soft, NT/ND NEURO:  Alert & Oriented x 3   Doristine Locks, DO [facility]   08/16/2023 2:13 PM

## 2023-08-17 ENCOUNTER — Telehealth: Payer: Self-pay | Admitting: *Deleted

## 2023-08-17 NOTE — Telephone Encounter (Signed)
  Follow up Call-     08/16/2023    1:41 PM  Call back number  Post procedure Call Back phone  # [phone]  Permission to leave phone message Yes     Patient questions:  Do you have a fever, pain , or abdominal swelling? No. Pain Score  0 *  Have you tolerated food without any problems? Yes.    Have you been able to return to your normal activities? Yes.    Do you have any questions about your discharge instructions: Diet   No. Medications  No. Follow up visit  No.  Do you have questions or concerns about your Care? No.  Actions: * If pain score is 4 or above: No action needed, pain <4.

## 2023-08-18 ENCOUNTER — Other Ambulatory Visit: Payer: Self-pay

## 2023-08-18 NOTE — Progress Notes (Signed)
 Specialty Pharmacy Refill Coordination Note  Adam Glass is a 68 y.o. male contacted today regarding refills of specialty medication(s) Omalizumab (Xolair)   Patient requested Courier to Provider Office   Delivery date: 09/01/23   Verified address: [address] [place] [address]   Medication will be filled on 03.27.25.

## 2023-08-21 ENCOUNTER — Encounter: Payer: Self-pay | Admitting: Gastroenterology

## 2023-08-21 LAB — SURGICAL PATHOLOGY

## 2023-08-23 ENCOUNTER — Other Ambulatory Visit: Payer: Self-pay

## 2023-08-30 DIAGNOSIS — M47896 Other spondylosis, lumbar region: Secondary | ICD-10-CM | POA: Diagnosis not present

## 2023-08-31 ENCOUNTER — Other Ambulatory Visit: Payer: Self-pay

## 2023-08-31 ENCOUNTER — Other Ambulatory Visit (HOSPITAL_COMMUNITY): Payer: Self-pay

## 2023-09-04 ENCOUNTER — Other Ambulatory Visit: Payer: Self-pay | Admitting: Urology

## 2023-09-04 DIAGNOSIS — C61 Malignant neoplasm of prostate: Secondary | ICD-10-CM

## 2023-09-04 DIAGNOSIS — M25551 Pain in right hip: Secondary | ICD-10-CM | POA: Diagnosis not present

## 2023-09-05 ENCOUNTER — Other Ambulatory Visit (HOSPITAL_COMMUNITY): Payer: Self-pay

## 2023-09-05 ENCOUNTER — Ambulatory Visit: Admitting: *Deleted

## 2023-09-05 DIAGNOSIS — L501 Idiopathic urticaria: Secondary | ICD-10-CM | POA: Diagnosis not present

## 2023-09-05 MED ORDER — OMALIZUMAB 300 MG/2  ML ~~LOC~~ SOSY
300.0000 mg | PREFILLED_SYRINGE | SUBCUTANEOUS | Status: AC
  Administered 2023-09-05 – 2024-07-03 (×10): 300 mg via SUBCUTANEOUS

## 2023-09-05 NOTE — Progress Notes (Deleted)
 Follow Up Note  RE: Adam Glass MRN: [id number] DOB: December 18, 1955 Date of Office Visit: 09/06/2023  Referring provider: Danella Penton, MD Primary care provider: Danella Penton, MD  Chief Complaint: No chief complaint on file.  History of Present Illness: I had the pleasure of seeing Christien Frankl for a follow up visit at the [facility] on 09/05/2023. He is a 68 y.o. male, who is being followed for urticaria on Xolair, adverse food reaction and alpha gal allergy. His previous allergy office visit was on 07/11/2023 with Dr. Selena Batten. Today is a regular follow up visit.  Discussed the use of AI scribe software for clinical note transcription with the patient, who gave verbal consent to proceed.  History of Present Illness            ***  Assessment and Plan: Ejay is a 68 y.o. male with: Rash and other nonspecific skin eruption Urticaria Past history - Recurrent, pruritic rash on torso, back, and waist following left knee replacement surgery. No prior history of similar rash. Recent changes in multiple medications due to diverticulitis and knee surgery. Improved with systemic steroids. 2025 labs unremarkable except shellfish panel borderline positive and alpha gal positive. Skin biopsy ? Drug eruption vs early erythema multiforme. Interim history -  Recurrent rash with itching, responsive to systemic steroids but recurs after discontinuation. No improvement with Zyrtec, Famotidine, and Montelukast. No clear trigger identified.  Will do a trial of Xolair injection given persistent symptoms and resolution with steroids.  Take prednisone 20mg  (2 tablets of 10mg ) once a day for 5 days then 1 tablet once a day for 5 days.  Xolair 300mg  injection sample given today. Continue every 4 weeks at our [place] office.  Tammy will be in touch with you regarding coverage.  For mild symptoms you can take over the counter antihistamines such as Benadryl 1-2 tablets = 25-50mg  and  monitor symptoms closely. If symptoms worsen or if you have severe symptoms including breathing issues, throat closure, significant swelling, whole body hives, severe diarrhea and vomiting, lightheadedness then inject epinephrine and seek immediate medical care afterwards. Emergency action plan in place. Consent was signed.  Keep track of rashes and take pictures. Continue zyrtec 10mg  twice a day and may take up to 20mg  twice a day.  If symptoms are not controlled or causes drowsiness let us know. Continue Pepcid (famotidine) 20mg  twice a day.  Continue Singulair (montelukast) 10mg  daily at night. Avoid the following potential triggers: alcohol, tight clothing, NSAIDs, hot showers and getting overheated. Continue proper skin care.    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 Tolerates finned fish. 2025 bloodwork borderline positive to shellfish and positive to alpha gal. Interim history -  avoiding red meat and dairy. Continue to avoid shellfish and red meat.  Okay to reintroduce diary back into diet. For mild symptoms you can take over the counter antihistamines such as Benadryl 1-2 tablets = 25-50mg  and monitor symptoms closely. If symptoms worsen or if you have severe symptoms including breathing issues, throat closure, significant swelling, whole body hives, severe diarrhea and vomiting, lightheadedness then inject epinephrine and seek immediate medical care afterwards. Emergency action plan in place.  Check website about your medications whether they are on the list or not.  [email or web address] Assessment and Plan              No follow-ups on  file.  No orders of the defined types were placed in this encounter.  Lab Orders  No laboratory test(s) ordered today    Diagnostics: Spirometry:  Tracings reviewed. His effort: {Blank single:19197::"Good  reproducible efforts.","It was hard to get consistent efforts and there is a question as to whether this reflects a maximal maneuver.","Poor effort, data can not be interpreted."} FVC: ***L FEV1: ***L, ***% predicted FEV1/FVC ratio: ***% Interpretation: {Blank single:19197::"Spirometry consistent with mild obstructive disease","Spirometry consistent with moderate obstructive disease","Spirometry consistent with severe obstructive disease","Spirometry consistent with possible restrictive disease","Spirometry consistent with mixed obstructive and restrictive disease","Spirometry uninterpretable due to technique","Spirometry consistent with normal pattern","No overt abnormalities noted given today's efforts"}.  Please see scanned spirometry results for details.  Skin Testing: {Blank single:19197::"Select foods","Environmental allergy panel","Environmental allergy panel and select foods","Food allergy panel","None","Deferred due to recent antihistamines use"}. *** Results discussed with patient/family.   Medication List:  Current Outpatient Medications  Medication Sig Dispense Refill   acetaminophen (TYLENOL) 500 MG tablet Take 2 tablets (1,000 mg total) by mouth every 8 (eight) hours. 30 tablet 0   bisoprolol (ZEBETA) 5 MG tablet Take 5 mg by mouth every evening.     cetirizine (ZYRTEC ALLERGY) 10 MG tablet Take 1 tablet (10 mg total) by mouth 2 (two) times daily. 60 tablet 3   clotrimazole-betamethasone (LOTRISONE) cream Apply topically 2 (two) times daily. (Patient not taking: Reported on 08/16/2023)     docusate (COLACE) 50 MG/5ML liquid Take by mouth daily.     EPINEPHrine 0.3 mg/0.3 mL IJ SOAJ injection Inject 0.3 mg into the muscle as needed for anaphylaxis. (Patient not taking: Reported on 08/16/2023) 2 each 1   famotidine (PEPCID) 20 MG tablet Take 1 tablet (20 mg total) by mouth 2 (two) times daily. 60 tablet 2   fluticasone (FLONASE) 50 MCG/ACT nasal spray Place 1 spray into both nostrils  daily as needed (allergies.).      hydrALAZINE (APRESOLINE) 50 MG tablet Take 50 mg by mouth in the morning and at bedtime.     montelukast (SINGULAIR) 10 MG tablet Take 1 tablet (10 mg total) by mouth at bedtime. 30 tablet 3   olmesartan (BENICAR) 40 MG tablet Take 40 mg by mouth every evening.      olopatadine (PATANOL) 0.1 % ophthalmic solution Place 1 drop into both eyes daily as needed for allergies (dryness).     omalizumab (XOLAIR) 300 MG/2  ML prefilled syringe Inject 300 mg into the skin every 28 (twenty-eight) days. 2 mL 11   potassium chloride (MICRO-K) 10 MEQ CR capsule Take 10 mEq by mouth daily.      tamsulosin (FLOMAX) 0.4 MG CAPS capsule Take 0.4 mg by mouth daily. (Patient not taking: Reported on 08/16/2023)     temazepam (RESTORIL) 30 MG capsule Take 30 mg by mouth at bedtime.     triamcinolone ointment (KENALOG) 0.1 % Apply 1 Application topically 2 (two) times daily as needed (rash flare). Do not use on the face, neck, armpits or groin area. Do not use more than 3 weeks in a row. 30 g 1   No current facility-administered medications for this visit.   Allergies: Allergies  Allergen Reactions   Shellfish Allergy Shortness Of Breath and Swelling    Throat/tongue swells   Alpha-Gal Hives   Oxycodone Hives and Itching    Unsure if it was the oxycodone, was taking and stopped at the same time as pt dx with Alpha-gal allergy   I reviewed his past medical history, social history,  family history, and environmental history and no significant changes have been reported from his previous visit.  Review of Systems  Constitutional:  Negative for appetite change, chills, fever and unexpected weight change.  HENT:  Negative for congestion and rhinorrhea.   Eyes:  Negative for itching.  Respiratory:  Negative for cough, chest tightness, shortness of breath and wheezing.   Cardiovascular:  Negative for chest pain.  Gastrointestinal:  Negative for abdominal pain.  Genitourinary:   Negative for difficulty urinating.  Skin:  Positive for rash.  Allergic/Immunologic: Positive for food allergies.  Neurological:  Negative for headaches.    Objective: There were no vitals taken for this visit. There is no height or weight on file to calculate BMI. Physical Exam Vitals and nursing note reviewed.  Constitutional:      Appearance: Normal appearance. He is well-developed.  HENT:     Head: Normocephalic and atraumatic.     Right Ear: Tympanic membrane and external ear normal.     Left Ear: Tympanic membrane and external ear normal.     Nose: Nose normal.     Mouth/Throat:     Mouth: Mucous membranes are moist.     Pharynx: Oropharynx is clear.  Eyes:     Conjunctiva/sclera: Conjunctivae normal.  Cardiovascular:     Rate and Rhythm: Normal rate and regular rhythm.     Heart sounds: Normal heart sounds. No murmur heard.    No friction rub. No gallop.  Pulmonary:     Effort: Pulmonary effort is normal.     Breath sounds: Normal breath sounds. No wheezing, rhonchi or rales.  Musculoskeletal:     Cervical back: Neck supple.  Skin:    General: Skin is warm.     Findings: Rash present.     Comments: Diffuse circular erythematous patches on torso which are blanchable.   Neurological:     Mental Status: He is alert and oriented to person, place, and time.  Psychiatric:        Behavior: Behavior normal.    Previous notes and tests were reviewed. The plan was reviewed with the patient/family, and all questions/concerned were addressed.  It was my pleasure to see Maysen today and participate in his care. Please feel free to contact me with any questions or concerns.  Sincerely,  Wyline Mood, DO Allergy & Immunology  [facility] office: [phone] [facility] office: [phone]

## 2023-09-06 ENCOUNTER — Ambulatory Visit (INDEPENDENT_AMBULATORY_CARE_PROVIDER_SITE_OTHER): Admitting: Allergy

## 2023-09-06 ENCOUNTER — Other Ambulatory Visit: Payer: Self-pay

## 2023-09-06 ENCOUNTER — Encounter: Payer: Self-pay | Admitting: Allergy

## 2023-09-06 ENCOUNTER — Ambulatory Visit: Payer: No Typology Code available for payment source | Admitting: Allergy

## 2023-09-06 VITALS — BP 132/80 | HR 61 | Temp 97.9°F | Resp 16

## 2023-09-06 DIAGNOSIS — Z91018 Allergy to other foods: Secondary | ICD-10-CM | POA: Diagnosis not present

## 2023-09-06 DIAGNOSIS — L501 Idiopathic urticaria: Secondary | ICD-10-CM

## 2023-09-06 DIAGNOSIS — T781XXD Other adverse food reactions, not elsewhere classified, subsequent encounter: Secondary | ICD-10-CM

## 2023-09-06 NOTE — Progress Notes (Signed)
 Follow Up Note  RE: [name] MRN: [id number] DOB: [birth year] Date of Office Visit: 09/06/2023  Referring provider: Danella Penton, MD Primary care provider: Danella Penton, MD  Chief Complaint: Urticaria and Rash  History of Present Illness: I had the pleasure of seeing Adam Glass for a follow up visit at the [facility] on 09/06/2023. He is a 68 y.o. male, who is being followed for urticaria on Xolair, alpha gal allergy. His previous allergy office visit was on 07/11/2023 with Dr. Selena Batten. Today is a regular follow up visit.  Discussed the use of AI scribe software for clinical note transcription with the patient, who gave verbal consent to proceed.    He has been receiving Xolair injections since February, with the most recent being yesterday. No adverse reactions or breakouts have occurred since starting the treatment. Initial prednisone treatment cleared his symptoms almost immediately, within two days, and he has not experienced a recurrence of symptoms.   He continues to take Zyrtec, Pepcid, and Singulair as prescribed, with Zyrtec and Pepcid taken twice daily and Singulair at night. He recalls possibly missing one or two doses of his evening medications but did not experience any flares or symptoms as a result. He also has two EpiPens at home for emergency use.  Avoiding all red meats and shellfish. He has reintroduced milk into his diet without any issues. He has not experienced any recent tick bites and ensures that none of his medications contain red meat byproducts.  He has recently started taking Mobic for hip pain, which is suspected to be arthritic in nature. He describes the pain as a constant ache, likening it to a 'slight headache in my hip.'     Assessment and Plan: [name] is a 68 y.o. male with: Chronic idiopathic urticaria Past history - Recurrent, pruritic rash on torso, back, and waist following left knee replacement surgery. No prior history of  similar rash. Recent changes in multiple medications due to diverticulitis and knee surgery. Improved with systemic steroids. 2025 labs unremarkable except shellfish panel borderline positive and alpha gal positive. Skin biopsy ? Drug eruption vs early erythema multiforme. Interim history - resolved within 2 days after starting Xolair in February 2025.  Continue Xolair 300mg  injection - every 4 weeks.  Keep track of rashes and take pictures. Continue zyrtec 10mg  twice a day.  If symptoms are not controlled or causes drowsiness let us know. Continue Pepcid (famotidine) 20mg  twice a day.  Stop Singulair (montelukast).  Avoid the following potential triggers: alcohol, tight clothing, NSAIDs, hot showers and getting overheated. Continue proper skin care.   Step down schedule:  Stop Singulair. Continue with zyrtec 10mg  twice a day. Continue Pepcid 20mg  twice a day. If no hives/itching for 2 weeks then: Decrease Pepcid to 20mg  once a day. Continue with zyrtec 10mg  twice a day. If no hives/itching for 2 weeks then: Decrease zyrtec to 10mg  once a day. Continue Pepcid 20mg  once a day. If no hives/itching for 2 weeks then: Stop Pepcid. Continue zyrtec 20mg  once a day. If no hives/itching for 2 weeks then: Stop zyrtec.  If you get hives then go back to the dose where you didn't have any symptoms.    Allergy to alpha-gal Other adverse food reactions, not elsewhere classified, subsequent encounter Past history - Hospitalized after shrimp reaction in the past. Tolerates finned fish. 2025 bloodwork borderline positive to shellfish and positive to alpha gal. Interim history -  tolerates dairy.  Continue to avoid shellfish and red meat.  For mild symptoms you can take over the counter antihistamines such as Benadryl 1-2 tablets = 25-50mg  and monitor symptoms closely. If symptoms worsen or if you have severe symptoms including breathing issues, throat closure, significant swelling, whole body hives, severe  diarrhea and vomiting, lightheadedness then inject epinephrine and seek immediate medical care afterwards. Emergency action plan in place.  Recheck alpha-gal at next visit.   Return in about 4 months (around 01/06/2024).  No orders of the defined types were placed in this encounter.  Lab Orders  No laboratory test(s) ordered today    Diagnostics: None.   Medication List:  Current Outpatient Medications  Medication Sig Dispense Refill   acetaminophen (TYLENOL) 500 MG tablet Take 2 tablets (1,000 mg total) by mouth every 8 (eight) hours. 30 tablet 0   bisoprolol (ZEBETA) 5 MG tablet Take 5 mg by mouth every evening.     cetirizine (ZYRTEC ALLERGY) 10 MG tablet Take 1 tablet (10 mg total) by mouth 2 (two) times daily. 60 tablet 3   docusate (COLACE) 50 MG/5ML liquid Take by mouth daily.     EPINEPHrine 0.3 mg/0.3 mL IJ SOAJ injection Inject 0.3 mg into the muscle as needed for anaphylaxis. 2 each 1   famotidine (PEPCID) 20 MG tablet Take 1 tablet (20 mg total) by mouth 2 (two) times daily. 60 tablet 2   fluticasone (FLONASE) 50 MCG/ACT nasal spray Place 1 spray into both nostrils daily as needed (allergies.).      hydrALAZINE (APRESOLINE) 50 MG tablet Take 50 mg by mouth in the morning and at bedtime.     meloxicam (MOBIC) 7.5 MG tablet Take 1 tablet twice a day by oral route with meal(s) for 14 days, for hip pain.     olmesartan (BENICAR) 40 MG tablet Take 40 mg by mouth every evening.      olopatadine (PATANOL) 0.1 % ophthalmic solution Place 1 drop into both eyes daily as needed for allergies (dryness).     omalizumab (XOLAIR) 300 MG/2  ML prefilled syringe Inject 300 mg into the skin every 28 (twenty-eight) days. 2 mL 11   potassium chloride (MICRO-K) 10 MEQ CR capsule Take 10 mEq by mouth daily.      temazepam (RESTORIL) 30 MG capsule Take 30 mg by mouth at bedtime.     Current Facility-Administered Medications  Medication Dose Route Frequency Provider Last Rate Last Admin    omalizumab Geoffry Paradise) prefilled syringe 300 mg  300 mg Subcutaneous Q28 days Ellamae Sia, DO   300 mg at 09/05/23 4098   Allergies: Allergies  Allergen Reactions   Shellfish Allergy Shortness Of Breath and Swelling    Throat/tongue swells   Alpha-Gal Hives   Oxycodone Hives and Itching    Unsure if it was the oxycodone, was taking and stopped at the same time as pt dx with Alpha-gal allergy   I reviewed his past medical history, social history, family history, and environmental history and no significant changes have been reported from his previous visit.  Review of Systems  Constitutional:  Negative for appetite change, chills, fever and unexpected weight change.  HENT:  Negative for congestion and rhinorrhea.   Eyes:  Negative for itching.  Respiratory:  Negative for cough, chest tightness, shortness of breath and wheezing.   Cardiovascular:  Negative for chest pain.  Gastrointestinal:  Negative for abdominal pain.  Genitourinary:  Negative for difficulty urinating.  Skin:  Negative for rash.  Allergic/Immunologic: Positive  for food allergies.  Neurological:  Negative for headaches.    Objective: BP 132/80 (BP Location: Left Arm, Patient Position: Sitting, Cuff Size: Normal)   Pulse 61   Temp 97.9 F (36.6 C) (Temporal)   Resp 16   SpO2 95%  There is no height or weight on file to calculate BMI. Physical Exam Vitals and nursing note reviewed.  Constitutional:      Appearance: Normal appearance. He is well-developed.  HENT:     Head: Normocephalic and atraumatic.     Right Ear: Tympanic membrane and external ear normal.     Left Ear: Tympanic membrane and external ear normal.     Nose: Nose normal.     Mouth/Throat:     Mouth: Mucous membranes are moist.     Pharynx: Oropharynx is clear.  Eyes:     Conjunctiva/sclera: Conjunctivae normal.  Cardiovascular:     Rate and Rhythm: Normal rate and regular rhythm.     Heart sounds: Normal heart sounds. No murmur heard.     No friction rub. No gallop.  Pulmonary:     Effort: Pulmonary effort is normal.     Breath sounds: Normal breath sounds. No wheezing, rhonchi or rales.  Musculoskeletal:     Cervical back: Neck supple.  Skin:    General: Skin is warm.     Findings: No rash.  Neurological:     Mental Status: He is alert and oriented to person, place, and time.  Psychiatric:        Behavior: Behavior normal.    Previous notes and tests were reviewed. The plan was reviewed with the patient/family, and all questions/concerned were addressed.  It was my pleasure to see Adam Glass today and participate in his care. Please feel free to contact me with any questions or concerns.  Sincerely,  Wyline Mood, DO Allergy & Immunology  [facility] office: [phone] [facility] office: [phone]

## 2023-09-06 NOTE — Patient Instructions (Addendum)
 Rash/hives Xolair 300mg  injection - every 4 weeks.   Keep track of rashes and take pictures. Continue zyrtec 10mg  twice a day.  If symptoms are not controlled or causes drowsiness let us know. Continue Pepcid (famotidine) 20mg  twice a day.  Stop Singulair (montelukast).  Avoid the following potential triggers: alcohol, tight clothing, NSAIDs, hot showers and getting overheated. Continue proper skin care.   Step down schedule:  Stop Singulair. Continue with zyrtec 10mg  twice a day. Continue Pepcid 20mg  twice a day. If no hives/itching for 2 weeks then: Decrease Pepcid to 20mg  once a day. Continue with zyrtec 10mg  twice a day. If no hives/itching for 2 weeks then: Decrease zyrtec to 10mg  once a day. Continue Pepcid 20mg  once a day. If no hives/itching for 2 weeks then: Stop Pepcid. Continue zyrtec 20mg  once a day. If no hives/itching for 2 weeks then: Stop zyrtec.  If you get hives then go back to the dose where you didn't have any symptoms.     Food allergy Continue to avoid shellfish and red meat.  For mild symptoms you can take over the counter antihistamines such as Benadryl 1-2 tablets = 25-50mg  and monitor symptoms closely. If symptoms worsen or if you have severe symptoms including breathing issues, throat closure, significant swelling, whole body hives, severe diarrhea and vomiting, lightheadedness then inject epinephrine and seek immediate medical care afterwards. Emergency action plan in place.   Return in about 4 months (around 01/06/2024). Or sooner if needed.  Alpha-gal and Red Meat Allergy   Overview An allergy to "alpha-gal" refers to having a severe and potentially life-threatening allergy to a carbohydrate molecule called galactose-alpha-1,3-galactose that is found in most mammalian or "red meat". Unlike other food allergies which typically occur within minutes of ingestion, symptoms from eating red meat such as pork, lamb or beef may be delayed, occurring 3-8 hours after  eating. Most food allergies are directed against a protein molecule, but alpha-gal is unusual because it is a carbohydrate, and a delay in its absorption may explain the delay in symptoms.  Helpful website: [employer]  What are the symptoms of an alpha-gal allergy? As with other food allergies, signs or symptoms of an allergy to alpha-gal may include: Hives and itching  Swelling of your lips, face or eyelids  Shortness of breath, cough or wheezing  Abdominal pain, nausea, diarrhea or vomiting The most severe reaction, anaphylaxis, can present as a combination of several of these symptoms, may include low blood pressure, and is potentially fatal.  Because these symptoms are delayed, you may only wake up with them in the middle of the night after an evening meal.  How is an alpha-gal allergy diagnosed? Diagnosis of this allergy starts with your allergist taking an appropriate history and physical examination. Because the onset is usually quite delayed, it can be hard to associate the symptoms with eating red meat many hours previously. Triggers include any red meat - including beef, pork, lamb or even horse products. It may occur after eating hotdogs and hamburgers. In very rare cases the reaction may extend to milk or dairy proteins and gelatin.  Your allergist may recommend testing that includes skin tests to the relevant animal proteins and blood tests which measure the levels of a specific immunoglobulin E (IgE) antibody, to mammalian meats. An investigational blood test, IgE against alpha-gal itself, may also aid in the diagnosis.  How is an alpha-gal allergy treated? Immediate symptoms such as hives or shortness of breath are treated the same as  any other food allergy - in an urgent care setting with anti-histamines, epinephrine and other medications. Prevention long-term involves avoidance of all red meat in sensitized individuals. You may be advised to carry an epinephrine  auto-injector, to be used in case of subsequent accidental exposures and reaction. These measures do not necessarily mean switching to a full vegetarian diet, since poultry and fish can be consumed and do not cause similar reactions. As with other food allergies, there is the possibility that over time the sensitivity diminishes - although these changes may take many years to become apparent.  How do you become allergic to alpha-gal? Alpha-gal is a molecule carried in the saliva of the [place] tick and other potential arthropods typically after feeding on mammalian blood. People that are bitten by the tick, especially those that are bitten repeatedly, are at risk of becoming sensitized and producing the IgE necessary to then cause allergic reactions. Interestingly, allergic reactions may occur to red meat, to subsequent tick bites, and even to medications that contain alpha-gal. Cetuximab is a cancer medication that contains alpha-gal, and people who have had allergic reactions to this medication (these are typically immediate reactions, because it is infused intravenously) have a higher risk for red meat allergy and are likely to have been bitten by ticks in the past. As might be expected, the incidence of tick bites is much higher in the [place] the traditional habitat for the tick. However, cases are now increasingly reported in the [place] and [place] states. And it is a phenomenon that has been observed worldwide, with different ticks responsible for similar cases of red meat allergy in many other countries such as [place], [place] and [place].  The discovery of this peculiar allergy has allowed researchers to correlate tick bites with many cases of anaphylaxis that would previously have been classified as 'idiopathic', or of unknown cause. Also, while it was originally thought that the [employer] tick had to feast on mammalian blood in order to carry the alpha-gal molecule, more  recent research has shown that it may carry this molecule and be capable of sensitizing humans independently.  How do you prevent an alpha-gal allergy? Because this allergy is predominantly tick born, you are more likely at risk if you often go outdoors in wooded areas for activities such as hiking, fishing or hunting. The key strategy is to prevent tick bites. This may include wearing long sleeved shirts or pants, using appropriate insect repellants, and surveying for ticks after spending time outdoors. Any observed ticks should be removed carefully by cleaning the site with rubbing alcohol, then using tweezers to pull the tick's head up carefully from the skin using steady pressure. Clean your hands and the site one more time and make sure not to crush the tick between your fingers.

## 2023-09-08 DIAGNOSIS — M25562 Pain in left knee: Secondary | ICD-10-CM | POA: Diagnosis not present

## 2023-09-08 DIAGNOSIS — Z96652 Presence of left artificial knee joint: Secondary | ICD-10-CM | POA: Diagnosis not present

## 2023-09-14 DIAGNOSIS — M47816 Spondylosis without myelopathy or radiculopathy, lumbar region: Secondary | ICD-10-CM | POA: Diagnosis not present

## 2023-09-21 ENCOUNTER — Other Ambulatory Visit: Payer: Self-pay

## 2023-09-21 NOTE — Progress Notes (Signed)
 Specialty Pharmacy Refill Coordination Note  Adam Glass is a 68 y.o. male contacted today regarding refills of specialty medication(s) Omalizumab [name])   Patient requested (Patient-Rptd) Delivery   Delivery date: (Patient-Rptd) 09/27/23   Verified address: (Patient-Rptd) [facility] [address]   Medication will be filled on 04.22.25.

## 2023-09-26 ENCOUNTER — Other Ambulatory Visit: Payer: Self-pay

## 2023-10-03 ENCOUNTER — Ambulatory Visit (INDEPENDENT_AMBULATORY_CARE_PROVIDER_SITE_OTHER)

## 2023-10-03 DIAGNOSIS — L501 Idiopathic urticaria: Secondary | ICD-10-CM

## 2023-10-05 ENCOUNTER — Encounter: Payer: Self-pay | Admitting: Urology

## 2023-10-05 DIAGNOSIS — M5136 Other intervertebral disc degeneration, lumbar region with discogenic back pain only: Secondary | ICD-10-CM | POA: Diagnosis not present

## 2023-10-09 DIAGNOSIS — L821 Other seborrheic keratosis: Secondary | ICD-10-CM | POA: Diagnosis not present

## 2023-10-09 DIAGNOSIS — D2262 Melanocytic nevi of left upper limb, including shoulder: Secondary | ICD-10-CM | POA: Diagnosis not present

## 2023-10-09 DIAGNOSIS — D225 Melanocytic nevi of trunk: Secondary | ICD-10-CM | POA: Diagnosis not present

## 2023-10-09 DIAGNOSIS — L814 Other melanin hyperpigmentation: Secondary | ICD-10-CM | POA: Diagnosis not present

## 2023-10-09 DIAGNOSIS — L578 Other skin changes due to chronic exposure to nonionizing radiation: Secondary | ICD-10-CM | POA: Diagnosis not present

## 2023-10-09 DIAGNOSIS — D485 Neoplasm of uncertain behavior of skin: Secondary | ICD-10-CM | POA: Diagnosis not present

## 2023-10-12 DIAGNOSIS — M7061 Trochanteric bursitis, right hip: Secondary | ICD-10-CM | POA: Diagnosis not present

## 2023-10-17 DIAGNOSIS — Z96652 Presence of left artificial knee joint: Secondary | ICD-10-CM | POA: Diagnosis not present

## 2023-10-19 ENCOUNTER — Ambulatory Visit
Admission: RE | Admit: 2023-10-19 | Discharge: 2023-10-19 | Disposition: A | Discharge status: Home / Self Care | Source: Ambulatory Visit | Attending: Urology

## 2023-10-19 DIAGNOSIS — K5730 Diverticulosis of large intestine without perforation or abscess without bleeding: Secondary | ICD-10-CM | POA: Diagnosis not present

## 2023-10-19 DIAGNOSIS — R972 Elevated prostate specific antigen [PSA]: Secondary | ICD-10-CM | POA: Diagnosis not present

## 2023-10-19 DIAGNOSIS — C61 Malignant neoplasm of prostate: Secondary | ICD-10-CM

## 2023-10-19 MED ORDER — GADOPICLENOL 0.5 MMOL/ML IV SOLN
9.0000 mL | Freq: Once | INTRAVENOUS | Status: AC | PRN
  Administered 2023-10-19: 9 mL via INTRAVENOUS

## 2023-10-20 ENCOUNTER — Other Ambulatory Visit: Payer: Self-pay

## 2023-10-20 NOTE — Progress Notes (Signed)
 Specialty Pharmacy Refill Coordination Note  Adam Glass is a 68 y.o. male contacted today regarding refills of specialty medication(s) Omalizumab  (XOLAIR )   Patient requested Courier to Provider Office   Delivery date: 11/02/23   Verified address: [facility]   Medication will be filled on 11/01/23.

## 2023-10-24 DIAGNOSIS — M25551 Pain in right hip: Secondary | ICD-10-CM | POA: Diagnosis not present

## 2023-10-26 DIAGNOSIS — D225 Melanocytic nevi of trunk: Secondary | ICD-10-CM | POA: Diagnosis not present

## 2023-10-26 DIAGNOSIS — D485 Neoplasm of uncertain behavior of skin: Secondary | ICD-10-CM | POA: Diagnosis not present

## 2023-10-26 DIAGNOSIS — D2362 Other benign neoplasm of skin of left upper limb, including shoulder: Secondary | ICD-10-CM | POA: Diagnosis not present

## 2023-11-01 ENCOUNTER — Other Ambulatory Visit: Payer: Self-pay

## 2023-11-06 ENCOUNTER — Ambulatory Visit

## 2023-11-06 DIAGNOSIS — L501 Idiopathic urticaria: Secondary | ICD-10-CM

## 2023-11-07 DIAGNOSIS — M4810 Ankylosing hyperostosis [Forestier], site unspecified: Secondary | ICD-10-CM | POA: Diagnosis not present

## 2023-11-07 DIAGNOSIS — M25551 Pain in right hip: Secondary | ICD-10-CM | POA: Diagnosis not present

## 2023-11-07 DIAGNOSIS — M545 Low back pain, unspecified: Secondary | ICD-10-CM | POA: Diagnosis not present

## 2023-11-10 DIAGNOSIS — M25551 Pain in right hip: Secondary | ICD-10-CM | POA: Diagnosis not present

## 2023-11-14 DIAGNOSIS — C61 Malignant neoplasm of prostate: Secondary | ICD-10-CM | POA: Diagnosis not present

## 2023-11-17 DIAGNOSIS — M25551 Pain in right hip: Secondary | ICD-10-CM | POA: Diagnosis not present

## 2023-11-27 ENCOUNTER — Other Ambulatory Visit: Payer: Self-pay

## 2023-11-27 NOTE — Progress Notes (Signed)
 Specialty Pharmacy Refill Coordination Note  Adam Glass is a 68 y.o. male contacted today regarding refills of specialty medication(s) Omalizumab  (XOLAIR )   Patient requested Courier to Provider Office   Delivery date: 11/29/23   Verified address: [address] [place] [address]   Medication will be filled on 11/28/23.

## 2023-11-28 DIAGNOSIS — M25551 Pain in right hip: Secondary | ICD-10-CM | POA: Diagnosis not present

## 2023-12-04 ENCOUNTER — Ambulatory Visit

## 2023-12-04 DIAGNOSIS — L501 Idiopathic urticaria: Secondary | ICD-10-CM | POA: Diagnosis not present

## 2023-12-05 DIAGNOSIS — M7071 Other bursitis of hip, right hip: Secondary | ICD-10-CM | POA: Diagnosis not present

## 2023-12-05 DIAGNOSIS — M545 Low back pain, unspecified: Secondary | ICD-10-CM | POA: Diagnosis not present

## 2023-12-20 DIAGNOSIS — M542 Cervicalgia: Secondary | ICD-10-CM | POA: Diagnosis not present

## 2023-12-20 DIAGNOSIS — M4810 Ankylosing hyperostosis [Forestier], site unspecified: Secondary | ICD-10-CM | POA: Diagnosis not present

## 2023-12-20 DIAGNOSIS — Z6824 Body mass index (BMI) 24.0-24.9, adult: Secondary | ICD-10-CM | POA: Diagnosis not present

## 2023-12-20 DIAGNOSIS — M546 Pain in thoracic spine: Secondary | ICD-10-CM | POA: Diagnosis not present

## 2023-12-21 DIAGNOSIS — Z125 Encounter for screening for malignant neoplasm of prostate: Secondary | ICD-10-CM | POA: Diagnosis not present

## 2023-12-21 DIAGNOSIS — Z79899 Other long term (current) drug therapy: Secondary | ICD-10-CM | POA: Diagnosis not present

## 2023-12-21 DIAGNOSIS — R739 Hyperglycemia, unspecified: Secondary | ICD-10-CM | POA: Diagnosis not present

## 2023-12-22 ENCOUNTER — Other Ambulatory Visit: Payer: Self-pay

## 2023-12-22 NOTE — Progress Notes (Signed)
   Specialty Pharmacy Refill Coordination Note  DESHAY BLUMENFELD is a 68 y.o. male assessed today regarding refills of clinic administered specialty medication(s) Omalizumab  (XOLAIR )   Clinic requested Courier to Provider Office   Delivery date: 12/26/23   Injection date: 01/01/24  Verified address: [address] [place] [address]   Medication will be filled on 12/25/23.

## 2023-12-25 ENCOUNTER — Other Ambulatory Visit: Payer: Self-pay

## 2024-01-01 ENCOUNTER — Ambulatory Visit (INDEPENDENT_AMBULATORY_CARE_PROVIDER_SITE_OTHER)

## 2024-01-01 DIAGNOSIS — M5416 Radiculopathy, lumbar region: Secondary | ICD-10-CM | POA: Diagnosis not present

## 2024-01-01 DIAGNOSIS — Z125 Encounter for screening for malignant neoplasm of prostate: Secondary | ICD-10-CM | POA: Diagnosis not present

## 2024-01-01 DIAGNOSIS — Z79899 Other long term (current) drug therapy: Secondary | ICD-10-CM | POA: Diagnosis not present

## 2024-01-01 DIAGNOSIS — Z1331 Encounter for screening for depression: Secondary | ICD-10-CM | POA: Diagnosis not present

## 2024-01-01 DIAGNOSIS — C61 Malignant neoplasm of prostate: Secondary | ICD-10-CM | POA: Diagnosis not present

## 2024-01-01 DIAGNOSIS — L501 Idiopathic urticaria: Secondary | ICD-10-CM

## 2024-01-01 DIAGNOSIS — E538 Deficiency of other specified B group vitamins: Secondary | ICD-10-CM | POA: Diagnosis not present

## 2024-01-01 DIAGNOSIS — Z0000 Encounter for general adult medical examination without abnormal findings: Secondary | ICD-10-CM | POA: Diagnosis not present

## 2024-01-01 DIAGNOSIS — G4733 Obstructive sleep apnea (adult) (pediatric): Secondary | ICD-10-CM | POA: Diagnosis not present

## 2024-01-01 DIAGNOSIS — R739 Hyperglycemia, unspecified: Secondary | ICD-10-CM | POA: Diagnosis not present

## 2024-01-02 ENCOUNTER — Encounter (HOSPITAL_BASED_OUTPATIENT_CLINIC_OR_DEPARTMENT_OTHER): Payer: Self-pay | Admitting: Physical Therapy

## 2024-01-02 ENCOUNTER — Ambulatory Visit (HOSPITAL_BASED_OUTPATIENT_CLINIC_OR_DEPARTMENT_OTHER): Attending: Orthopedic Surgery | Admitting: Physical Therapy

## 2024-01-02 ENCOUNTER — Ambulatory Visit (HOSPITAL_BASED_OUTPATIENT_CLINIC_OR_DEPARTMENT_OTHER): Admitting: Physical Therapy

## 2024-01-02 ENCOUNTER — Encounter (HOSPITAL_BASED_OUTPATIENT_CLINIC_OR_DEPARTMENT_OTHER): Payer: Self-pay

## 2024-01-02 DIAGNOSIS — M6281 Muscle weakness (generalized): Secondary | ICD-10-CM | POA: Insufficient documentation

## 2024-01-02 DIAGNOSIS — R2689 Other abnormalities of gait and mobility: Secondary | ICD-10-CM | POA: Insufficient documentation

## 2024-01-02 DIAGNOSIS — M5459 Other low back pain: Secondary | ICD-10-CM | POA: Insufficient documentation

## 2024-01-02 NOTE — Therapy (Signed)
 OUTPATIENT PHYSICAL THERAPY THORACOLUMBAR EVALUATION   Patient Name: Adam Glass MRN: [id number] DOB:March 23, 1956, 68 y.o., male Today's Date: 01/02/2024  END OF SESSION:  PT End of Session - 01/02/24 1634     Visit Number 1    Number of Visits 8    Date for PT Re-Evaluation 02/09/24    Authorization Type [employer]    PT Start Time [id number]    PT Stop Time 1015    PT Time Calculation (min) 42 min    Activity Tolerance Patient tolerated treatment well    Behavior During Therapy [facility] for tasks assessed/performed          Past Medical History:  Diagnosis Date   Acute diverticulitis    Allergy    seasonal, alpha-gal, shellfish   Arthritis    Atrial fibrillation (HCC)    Change in bowel habits    Constipation    Depression    Dysrhythmia    A fib   Hypertension    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   a.) surgical PCR (+) 03/28/2023 prior to LEFT TKR   OSA on CPAP    Prostate cancer (HCC)    Sleep apnea    uses CPAP   Spondylosis of lumbar region without myelopathy or radiculopathy    Tubular adenoma    Urticaria    Past Surgical History:  Procedure Laterality Date   COLONOSCOPY     COLONOSCOPY WITH PROPOFOL  N/A 10/20/2017   Procedure: COLONOSCOPY WITH PROPOFOL ;  Surgeon: Viktoria Lamar DASEN, MD;  Location: [facility] ENDOSCOPY;  Service: Endoscopy;  Laterality: N/A;   EYE SURGERY Left    Retina Tear, repaired with laser   KNEE ARTHROSCOPY Left    KNEE ARTHROSCOPY Left 11/09/2018   Procedure: ARTHROSCOPY KNEE LEFT;  Surgeon: Mardee Lynwood SQUIBB, MD;  Location: [facility] ORS;  Service: Orthopedics;  Laterality: Left;   ROTATOR CUFF REPAIR Left    TOTAL KNEE ARTHROPLASTY Left 04/03/2023   Procedure: TOTAL KNEE ARTHROPLASTY;  Surgeon: Lorelle Hussar, MD;  Location: [facility] ORS;  Service: Orthopedics;  Laterality: Left;   VASECTOMY     Patient Active Problem List   Diagnosis Date Noted   Stiffness of left knee 05/03/2023   S/P TKR (total knee replacement), left 04/03/2023    Acute diverticulitis 01/27/2023   Hypokalemia 01/27/2023   Medicare annual wellness visit, initial 06/29/2022   Spondylosis without myelopathy or radiculopathy, lumbar region 10/09/2019   Tubular adenoma 10/26/2017   Prostate cancer (HCC) 05/03/2017   Benign essential hypertension 12/11/2015   OSA on CPAP 12/11/2015   Hypertension 09/17/2014   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  Dr Burnetta is wanting me to try this thinking it may help.  Dr Bonner was doubtful.  I have a membership her have done some deep water classes for my knee.  Didn't pay attention to how my back felt.  Back pain is variable.  No pain at night when I sleep.  PERTINENT HISTORY:  progressive stiffness and pain in the hips and lumbar spine. 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 Fortunately the patient has no focal neurological deficits nor any signs or symptoms of radiculopathy or myelopathy. At this point there is no structural abnormality that would require surgical intervention. I do believe the stiffness and pain that he is having is due to the autofusion secondary to his DISH.   PAIN:  Are you having pain? Yes: NPRS scale: normal pain 4-5/10; max 6-7/10 Pain location: LB r>L Pain description: ache throb,  sharp Aggravating factors: bending Relieving factors: OTC meds; lying down,  heat  PRECAUTIONS: None  RED FLAGS: None   WEIGHT BEARING RESTRICTIONS: No  FALLS:  Has patient fallen in last 6 months? No  LIVING ENVIRONMENT: Lives with: lives with their spouse Lives in: House/apartment Stairs: Yes: Internal: 16 steps; on right going up Has following equipment at home: Single point cane and Walker - 2 wheeled  OCCUPATION: retired  PLOF: Independent  PATIENT GOALS: recognize how to use water for ex; play golf  NEXT MD VISIT: as needed  OBJECTIVE:  Note: Objective measures were completed at Evaluation unless otherwise noted.  DIAGNOSTIC FINDINGS:  MRI Lumbar spine IMPRESSION: 1. Chronic DISH with interbody ankylosis from the lower thoracic spine through to at least L3, possibly developing at L3-L4, and asymmetric right SI joint ankylosis noted on the August CT. No acute or suspicious osseous lesion. 2. New lower lumbar erector spinae muscle signal abnormality from L5 to the sacrum is new since 2022 but more resembles developing muscle atrophy than muscle edema. This might be related to #1. No paraspinal mass or lymphadenopathy. 3. L4-L5 and L5-S1 increased bilateral neural foraminal stenosis since 2022 is mild to moderate. No spinal or lateral recess stenosis.  PATIENT SURVEYS:  Modified Oswestry:  MODIFIED OSWESTRY DISABILITY SCALE  Date: 01/02/24 Score  Pain intensity 4 =  Pain medication provides me with little relief from pain.  2. Personal care (washing, dressing, etc.) 1 =  I can take care of myself normally, but it increases my pain.  3. Lifting 3 = Pain prevents me from lifting heavy weights, but I can manage (5) I have hardly any social life because of my pain. light to medium weights if they are conveniently positioned  4. Walking 1 = Pain prevents me from walking more than 1 mile.  5. Sitting 1 =  I can only sit in my favorite chair as long as I like.  6.  Standing 2 =  Pain prevents me from standing more than 1 hour  7. Sleeping 0 = Pain does not prevent me from sleeping well.  8. Social Life 2 = Pain prevents me from participating in more energetic activities (eg. sports, dancing).  9. Traveling 0 =  I can travel anywhere without increased pain.  10. Employment/ Homemaking 2 = I can perform most of my homemaking/job duties, but pain prevents me from performing more physically stressful activities (eg, lifting,  vacuuming).  Total 16/50=32%   Interpretation of scores: Score Category Description  0-20% Minimal Disability The patient can cope with most living activities. Usually no treatment is indicated apart from advice on lifting, sitting and exercise  21-40% Moderate Disability The patient experiences more pain and difficulty with sitting, lifting and standing. Travel and social life are more difficult and they may be disabled from work. Personal care, sexual activity and sleeping are not grossly affected, and the patient can usually be managed by conservative means  41-60% Severe Disability Pain remains the main problem in this group, but activities of daily living are affected. These patients require a detailed investigation  61-80% Crippled Back pain impinges on all aspects of the patient's life. Positive intervention is required  81-100% Bed-bound  These patients are either bed-bound or exaggerating their symptoms  Bluford FORBES Zoe DELENA Karon DELENA, et al. Surgery versus conservative management of stable thoracolumbar fracture: the PRESTO feasibility RCT. [place] [employer]; 2021 Nov. [facility] Technology Assessment, No. 25.62.) Appendix 3, Oswestry Disability Index category descriptors. Available from: FindJewel[employer].cz  Minimally Clinically Important Difference (MCID) = 12.8%  COGNITION: Overall cognitive status: Within functional limits for tasks assessed     SENSATION: WFL  MUSCLE  LENGTH: Hamstrings: tight bilaterally in sitting   POSTURE: rounded shoulders, forward head, decreased lumbar lordosis, and flexed trunk    LUMBAR ROM:   Not tested as increases pain due to ankylosis from lower thoracic to L3  LOWER EXTREMITY ROM:     wfl  LOWER EXTREMITY MMT:    MMT Right eval Left eval  Hip flexion 78.1 31.2  Hip extension    Hip abduction  25.1 30.1  Hip adduction    Hip internal rotation    Hip external rotation    Knee flexion    Knee extension    Ankle dorsiflexion    Ankle plantarflexion    Ankle inversion    Ankle eversion     (Blank rows = not tested)   FUNCTIONAL TESTS:  Timed up and go (TUG): 17.43  GAIT: Distance walked: 500 ft Assistive device utilized: None Level of assistance: Complete Independence Comments: Pt reports no limitation to amb  TREATMENT  Eval Self care:Posture and body Curator instruction. Use of AD; Pool access; stair climbing safety; transfers                                                                                                                               PATIENT EDUCATION:  Education details: Discussed eval findings, rehab rationale, aquatic program progression/POC and pools in area. Patient is in agreement  Person educated: Patient Education method: Explanation Education comprehension: verbalized understanding  HOME EXERCISE PROGRAM: Aquatic to be assigned  ASSESSMENT:  CLINICAL IMPRESSION: Patient is a 68 y.o. m who was seen today for physical therapy evaluation and treatment for LBP without radiculopathy. He is an active senior with recent left TKR.  He presents with pain  limited deficits in low thoracic and lumbar spine affecting ROM, strength, activity tolerance, gait, balance, and functional mobility with ADL's. He scores in the moderate disability category in MOI. He is a good candidate for aquatic therapy intervention and will benefit from the properties of water to progress toward  goals.   OBJECTIVE IMPAIRMENTS: Abnormal gait, decreased activity tolerance, decreased balance, decreased endurance, decreased mobility, difficulty walking, decreased ROM, decreased strength, postural dysfunction, and pain.   ACTIVITY LIMITATIONS: carrying, lifting, bending, squatting, and transfers  PARTICIPATION LIMITATIONS: cleaning, community activity, and yard work  PERSONAL FACTORS: Time since onset of injury/illness/exacerbation are also affecting patient's functional outcome.   REHAB POTENTIAL: Good  CLINICAL DECISION MAKING: Evolving/moderate complexity  EVALUATION COMPLEXITY: Moderate   GOALS: Goals reviewed with patient? Yes  SHORT TERM GOALS: Target date: 02/06/24  Pt will tolerate full aquatic sessions consistently without increase in pain and with improving function to demonstrate good toleration and effectiveness of intervention.  Baseline: Goal status: INITIAL  2.  Pt to improve on ODI by 13% (MCID)to demonstrate statistically significant Improvement in function. Baseline: 16/50=32% Goal status: INITIAL  3.  Pt will be indep with final aquatic HEP for continued management of condition. Baseline:  Goal status: INITIAL  4.  Pt will decide if aquatic intervention to manage chronic condition warrants gaining pool access for future completion of aquatic HEP Baseline:  Goal status: INITIAL  5.  Pt will improve strength in left hip flex by 5 lbs to demonstrate improved overall physical function  Baseline: see chart Goal status: INITIAL  6.  Pt will report decrease in pain by at least 25% for improved toleration to activity/quality of life and to demonstrate improved management of pain. Baseline:  Goal status: INITIAL  LONG TERM GOALS: to be assigned as approp at re-cert  PLAN:  PT FREQUENCY: 1x/week  PT DURATION: 6 weeks  PLANNED INTERVENTIONS: 97164- PT Re-evaluation, 97750- Physical Performance Testing, 97110-Therapeutic exercises, 97530- Therapeutic  activity, [id number]- Neuromuscular re-education, 97535- Self Care, 02859- Manual therapy, [id number]- Gait training, [phone]- Aquatic Therapy, [phone] (1-2 muscles), 20561 (3+ muscles)- Dry Needling, Patient/Family education, Balance training, Stair training, Taping, Joint mobilization, DME instructions, Cryotherapy, and Moist heat.  PLAN FOR NEXT SESSION: Aquatics: stretching and ROM of thoracic and lumbar spine; posture and body mechanic instruction. Hip strengthening (L significantly weaker >right).  HEP pt member here at [facility].  High co-pay for therapy.   Ronal [place]) Branston Halsted MPT 01/02/24 4:47 PM [facility] [address] [place], [place], [address] Phone: [phone]   Fax:  [phone]

## 2024-01-08 ENCOUNTER — Telehealth: Payer: Self-pay | Admitting: Allergy

## 2024-01-08 NOTE — Telephone Encounter (Signed)
 Pt Doctor Osmin Welz called about pt gotten worse since he stopped the Zyrtec  and wants permission to start it again an requested call back at [phone].

## 2024-01-09 NOTE — Telephone Encounter (Signed)
 Patient has appointment with me on 8/6. Okay to resume zyrtec  10mg  1-2 times a day as before.

## 2024-01-09 NOTE — Progress Notes (Unsigned)
 Follow Up Note  RE: Adam Glass MRN: [id number] DOB: 1956-03-28 Date of Office Visit: 01/10/2024  Referring provider: Cleotilde Oneil FALCON, MD Primary care provider: Cleotilde Oneil FALCON, MD  Chief Complaint: Urticaria, Rash, and Follow-up (Watery eye)  History of Present Illness: I had the pleasure of seeing Adam Glass for a follow up visit at the [facility] on 01/10/2024. He is a 68 y.o. 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 His previous allergy office visit was on September 06, 2023 with Dr. Luke. Today is a regular follow up visit.  Discussed the use of AI scribe software for clinical note transcription with the patient, who gave verbal consent to proceed.    He has not experienced any recurrence of hives since his last visit four months ago. He continues to receive Xolair  300 mg every four weeks. He has been able to discontinue all other allergy medications except for cetirizine , which he resumed one week ago due to eye symptoms.  He describes experiencing watery eyes and drainage that began two to three weeks ago, coinciding with stopping cetirizine . Resuming cetirizine  has helped reduce the symptoms, though they have not completely resolved. He is also using eye drops as part of his treatment.  He has a history of seasonal allergies and was previously on cetirizine  two years ago before the onset of hives. He continues to avoid shellfish and limits red meat intake due to previous reactions. He reports no recent tick bites and has an EpiPen  available. He occasionally consumes red meat, such as bacon or hamburgers, without significant allergic reactions, though he notes some digestive discomfort.     Assessment and Plan: [name] is a 68 y.o. male with: Chronic idiopathic urticaria Past history - Recurrent, pruritic rash on torso, back, and waist following left knee replacement surgery. No prior history of similar rash. Recent changes in multiple  medications due to diverticulitis and knee surgery. Improved with systemic steroids. 2025 labs unremarkable except shellfish panel borderline positive and alpha gal positive. Skin biopsy ? Drug eruption vs early erythema multiforme. Resolved within 2 days after starting Xolair  in February 2025. Interim history - well-controlled with Xolair  300 mg every four weeks. No hives or itching reported. Plan to extend interval between injections with monitoring for symptom recurrence. Able to stop antihistamines.  Xolair  300mg  injection. Every 5 weeks x 3 times, then every 6 weeks x 3 times then stop. If you notice any symptoms then let us  know.  Keep track of rashes and take pictures. Avoid the following potential triggers: alcohol, tight clothing, NSAIDs, hot showers and getting overheated. Continue proper skin care.  On first onset of hives: Start cetirizine  10mg  up to twice a day.  Start famotidine  20mg  twice a day. Start Singulair  10mg  once a day.  Allergy to alpha-gal Other adverse food reactions, not elsewhere classified, subsequent encounter Past history - Hospitalized after shrimp reaction in the past. Tolerates finned fish. 2025 bloodwork borderline positive to shellfish and positive to alpha gal. Interim history -  tolerates dairy, small amounts of bacon/hamburger without any issues.  Continue to avoid shellfish. Okay to eat limited red meat - once per week. If you notice any issues then stop and let us  know.  For mild symptoms you can take over the counter antihistamines (zyrtec  10mg  to 20mg ) and monitor symptoms closely.  If symptoms worsen or if you have severe symptoms including breathing issues, throat closure, significant swelling, whole body hives,  severe diarrhea and vomiting, lightheadedness then use epinephrine  and seek immediate medical care afterwards. Emergency action plan in place.  Can't check IgE levels due to being on Xolair  at this time.  Conjunctivitis of both eyes Okay  to continue with cetirizine  10mg  daily. Follow up with your eye doctor.    Return in about 6 months (around 07/12/2024).  No orders of the defined types were placed in this encounter.  Lab Orders  No laboratory test(s) ordered today    Diagnostics: None.   Medication List:  Current Outpatient Medications  Medication Sig Dispense Refill   acetaminophen  (TYLENOL ) 500 MG tablet Take 2 tablets (1,000 mg total) by mouth every 8 (eight) hours. 30 tablet 0   bisoprolol  (ZEBETA ) 5 MG tablet Take 5 mg by mouth every evening.     EPINEPHrine  0.3 mg/0.3 mL IJ SOAJ injection Inject 0.3 mg into the muscle as needed for anaphylaxis. 2 each 1   fluticasone (FLONASE) 50 MCG/ACT nasal spray Place 1 spray into both nostrils daily as needed (allergies.).      hydrALAZINE  (APRESOLINE ) 50 MG tablet Take 50 mg by mouth in the morning and at bedtime.     loteprednol (LOTEMAX) 0.5 % ophthalmic suspension Place 2 drops into both eyes in the morning and at bedtime.     olmesartan (BENICAR) 40 MG tablet Take 40 mg by mouth every evening.      omalizumab  (XOLAIR ) 300 MG/2  ML prefilled syringe Inject 300 mg into the skin every 28 (twenty-eight) days. 2 mL 11   potassium chloride  (MICRO-K ) 10 MEQ CR capsule Take 10 mEq by mouth daily.      tamsulosin (FLOMAX) 0.4 MG CAPS capsule Take 0.4 mg by mouth daily.     cetirizine  (ZYRTEC  ALLERGY) 10 MG tablet Take 1 tablet (10 mg total) by mouth 2 (two) times daily. 60 tablet 3   olopatadine (PATANOL) 0.1 % ophthalmic solution Place 1 drop into both eyes daily as needed for allergies (dryness).     Current Facility-Administered Medications  Medication Dose Route Frequency Provider Last Rate Last Admin   omalizumab  (XOLAIR ) prefilled syringe 300 mg  300 mg Subcutaneous Q28 days Luke Orlan HERO, DO   300 mg at 01/01/24 1500   Allergies: Allergies  Allergen Reactions   Shellfish Allergy Shortness Of Breath and Swelling    Throat/tongue swells   Alpha-Gal Hives   Oxycodone   Hives and Itching    Unsure if it was the oxycodone , was taking and stopped at the same time as pt dx with Alpha-gal allergy   I reviewed his past medical history, social history, family history, and environmental history and no significant changes have been reported from his previous visit.  Review of Systems  Constitutional:  Negative for appetite change, chills, fever and unexpected weight change.  HENT:  Negative for congestion and rhinorrhea.   Eyes:  Positive for redness. Negative for itching.       Watery eyes  Respiratory:  Negative for cough, chest tightness, shortness of breath and wheezing.   Cardiovascular:  Negative for chest pain.  Gastrointestinal:  Negative for abdominal pain.  Genitourinary:  Negative for difficulty urinating.  Skin:  Negative for rash.  Allergic/Immunologic: Positive for food allergies.  Neurological:  Negative for headaches.    Objective: BP 138/82 (BP Location: Left Arm, Patient Position: Sitting, Cuff Size: Normal)   Pulse (!) 58   Temp 98.3 F (36.8 C) (Skin)   Wt 202 lb 8 oz (91.9 kg)   SpO2 ROLLEN)  85%   BMI 24.65 kg/m  Body mass index is 24.65 kg/m. Physical Exam Vitals and nursing note reviewed.  Constitutional:      Appearance: Normal appearance. He is well-developed.  HENT:     Head: Normocephalic and atraumatic.     Right Ear: Tympanic membrane and external ear normal.     Left Ear: Tympanic membrane and external ear normal.     Nose: Nose normal.     Mouth/Throat:     Mouth: Mucous membranes are moist.     Pharynx: Oropharynx is clear.  Eyes:     Conjunctiva/sclera: Conjunctivae normal.     Comments: Slightly erythematous conjunctiva b/l and watery eyes b/l.  Cardiovascular:     Rate and Rhythm: Normal rate and regular rhythm.     Heart sounds: Normal heart sounds. No murmur heard.    No friction rub. No gallop.  Pulmonary:     Effort: Pulmonary effort is normal.     Breath sounds: Normal breath sounds. No wheezing, rhonchi  or rales.  Musculoskeletal:     Cervical back: Neck supple.  Skin:    General: Skin is warm.     Findings: No rash.  Neurological:     Mental Status: He is alert and oriented to person, place, and time.  Psychiatric:        Behavior: Behavior normal.    Previous notes and tests were reviewed. The plan was reviewed with the patient/family, and all questions/concerned were addressed.  It was my pleasure to see Jen today and participate in his care. Please feel free to contact me with any questions or concerns.  Sincerely,  Orlan Cramp, DO Allergy & Immunology  [facility]  [place] office: [phone] [facility] office: [phone]

## 2024-01-09 NOTE — Telephone Encounter (Signed)
 I called the patient and left message informing him that he can resume zyrtec . And to confirm his appointment with us  tomorrow 01/10/24 @ 10:30AM

## 2024-01-10 ENCOUNTER — Encounter: Payer: Self-pay | Admitting: Allergy

## 2024-01-10 ENCOUNTER — Other Ambulatory Visit: Payer: Self-pay

## 2024-01-10 ENCOUNTER — Ambulatory Visit (INDEPENDENT_AMBULATORY_CARE_PROVIDER_SITE_OTHER): Admitting: Allergy

## 2024-01-10 VITALS — BP 138/82 | HR 58 | Temp 98.3°F | Wt 202.5 lb

## 2024-01-10 DIAGNOSIS — H1013 Acute atopic conjunctivitis, bilateral: Secondary | ICD-10-CM | POA: Diagnosis not present

## 2024-01-10 DIAGNOSIS — T781XXD Other adverse food reactions, not elsewhere classified, subsequent encounter: Secondary | ICD-10-CM | POA: Diagnosis not present

## 2024-01-10 DIAGNOSIS — L501 Idiopathic urticaria: Secondary | ICD-10-CM

## 2024-01-10 DIAGNOSIS — Z91018 Allergy to other foods: Secondary | ICD-10-CM

## 2024-01-10 NOTE — Patient Instructions (Addendum)
 Rash/hives Xolair  300mg  injection. Every 5 weeks x 3 times, then every 6 weeks x 3 times then stop. If you notice any symptoms then let us  know.   Keep track of rashes and take pictures. Avoid the following potential triggers: alcohol, tight clothing, NSAIDs, hot showers and getting overheated. Continue proper skin care.   On first onset of hives: Start cetirizine  10mg  up to twice a day.  Start famotidine  20mg  twice a day. Start Singulair  10mg  once a day.  Eyes Okay to continue with cetirizine  10mg  daily. Follow up with your eye doctor.    Food allergy Continue to avoid shellfish. Okay to eat limited red meat - once per week. If you notice any issues then stop and let us  know.  For mild symptoms you can take over the counter antihistamines (zyrtec  10mg  to 20mg ) and monitor symptoms closely.  If symptoms worsen or if you have severe symptoms including breathing issues, throat closure, significant swelling, whole body hives, severe diarrhea and vomiting, lightheadedness then use epinephrine  and seek immediate medical care afterwards. Emergency action plan in place.   Return in about 6 months (around 07/12/2024). Or sooner if needed.

## 2024-01-11 ENCOUNTER — Ambulatory Visit (HOSPITAL_BASED_OUTPATIENT_CLINIC_OR_DEPARTMENT_OTHER): Payer: Self-pay | Attending: Orthopedic Surgery | Admitting: Physical Therapy

## 2024-01-11 ENCOUNTER — Encounter (HOSPITAL_BASED_OUTPATIENT_CLINIC_OR_DEPARTMENT_OTHER): Payer: Self-pay | Admitting: Physical Therapy

## 2024-01-11 DIAGNOSIS — M6281 Muscle weakness (generalized): Secondary | ICD-10-CM | POA: Insufficient documentation

## 2024-01-11 DIAGNOSIS — M5459 Other low back pain: Secondary | ICD-10-CM | POA: Insufficient documentation

## 2024-01-11 DIAGNOSIS — R2689 Other abnormalities of gait and mobility: Secondary | ICD-10-CM | POA: Insufficient documentation

## 2024-01-11 NOTE — Therapy (Signed)
 OUTPATIENT PHYSICAL THERAPY THORACOLUMBAR EVALUATION   Patient Name: Adam Glass MRN: [id number] DOB:11-Aug-1955, 68 y.o., male Today's Date: 01/11/2024  END OF SESSION:  PT End of Session - 01/11/24 1304     Visit Number 2    Number of Visits 8    Date for PT Re-Evaluation 02/09/24    Authorization Type [employer]    PT Start Time 1101    PT Stop Time 1145    PT Time Calculation (min) 44 min    Activity Tolerance Patient tolerated treatment well    Behavior During Therapy [facility] for tasks assessed/performed           Past Medical History:  Diagnosis Date   Acute diverticulitis    Allergy    seasonal, alpha-gal, shellfish   Arthritis    Atrial fibrillation (HCC)    Change in bowel habits    Constipation    Depression    Dysrhythmia    A fib   Hypertension    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   a.) surgical PCR (+) 03/28/2023 prior to LEFT TKR   OSA on CPAP    Prostate cancer (HCC)    Sleep apnea    uses CPAP   Spondylosis of lumbar region without myelopathy or radiculopathy    Tubular adenoma    Urticaria    Past Surgical History:  Procedure Laterality Date   COLONOSCOPY     COLONOSCOPY WITH PROPOFOL  N/A 10/20/2017   Procedure: COLONOSCOPY WITH PROPOFOL ;  Surgeon: Viktoria Lamar DASEN, MD;  Location: [facility] ENDOSCOPY;  Service: Endoscopy;  Laterality: N/A;   EYE SURGERY Left    Retina Tear, repaired with laser   KNEE ARTHROSCOPY Left    KNEE ARTHROSCOPY Left 11/09/2018   Procedure: ARTHROSCOPY KNEE LEFT;  Surgeon: Mardee Lynwood SQUIBB, MD;  Location: [facility] ORS;  Service: Orthopedics;  Laterality: Left;   ROTATOR CUFF REPAIR Left    TOTAL KNEE ARTHROPLASTY Left 04/03/2023   Procedure: TOTAL KNEE ARTHROPLASTY;  Surgeon: Lorelle Hussar, MD;  Location: [facility] ORS;  Service: Orthopedics;  Laterality: Left;   VASECTOMY     Patient Active Problem List   Diagnosis Date Noted   Stiffness of left knee 05/03/2023   S/P TKR (total knee replacement), left 04/03/2023    Acute diverticulitis 01/27/2023   Hypokalemia 01/27/2023   Medicare annual wellness visit, initial 06/29/2022   Spondylosis without myelopathy or radiculopathy, lumbar region 10/09/2019   Tubular adenoma 10/26/2017   Prostate cancer (HCC) 05/03/2017   Benign essential hypertension 12/11/2015   OSA on CPAP 12/11/2015   Hypertension 09/17/2014   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  Dr Burnetta is wanting me to try this thinking it may help.  Dr Bonner was doubtful.  I have a membership her have done some deep water classes for my knee.  Didn't pay attention to how my back felt.  Back pain is variable.  No pain at night when I sleep.  PERTINENT HISTORY:  progressive stiffness and pain in the hips and lumbar spine. 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 Fortunately the patient has no focal neurological deficits nor any signs or symptoms of radiculopathy or myelopathy. At this point there is no structural abnormality that would require surgical intervention. I do believe the stiffness and pain that he is having is due to the autofusion secondary to his DISH.   PAIN:  Are you having pain? Yes: NPRS scale: normal pain 4-5/10;  max 6-7/10 Pain location: LB r>L Pain description: ache throb, sharp Aggravating factors: bending Relieving factors: OTC meds; lying down,  heat  PRECAUTIONS: None  RED FLAGS: None   WEIGHT BEARING RESTRICTIONS: No  FALLS:  Has patient fallen in last 6 months? No  LIVING ENVIRONMENT: Lives with: lives with their spouse Lives in: House/apartment Stairs: Yes: Internal: 16 steps; on right going up Has following equipment at home: Single point cane and Walker - 2 wheeled  OCCUPATION: retired  PLOF: Independent  PATIENT GOALS: recognize how to use water for ex; play golf  NEXT MD VISIT: as needed  OBJECTIVE:  Note: Objective measures were completed at Evaluation unless otherwise noted.  DIAGNOSTIC FINDINGS:  MRI Lumbar spine IMPRESSION: 1. Chronic DISH with interbody ankylosis from the lower thoracic spine through to at least L3, possibly developing at L3-L4, and asymmetric right SI joint ankylosis noted on the August CT. No acute or suspicious osseous lesion. 2. New lower lumbar erector spinae muscle signal abnormality from L5 to the sacrum is new since 2022 but more resembles developing muscle atrophy than muscle edema. This might be related to #1. No paraspinal mass or lymphadenopathy. 3. L4-L5 and L5-S1 increased bilateral neural foraminal stenosis since 2022 is mild to moderate. No spinal or lateral recess stenosis.  PATIENT SURVEYS:  Modified Oswestry:  MODIFIED OSWESTRY DISABILITY SCALE  Date: 01/02/24 Score  Pain intensity 4 =  Pain medication provides me with little relief from pain.  2. Personal care (washing, dressing, etc.) 1 =  I can take care of myself normally, but it increases my pain.  3. Lifting 3 = Pain prevents me from lifting heavy weights, but I can manage (5) I have hardly any social life because of my pain. light to medium weights if they are conveniently positioned  4. Walking 1 = Pain prevents me from walking more than 1 mile.  5. Sitting 1  =  I can only sit in my favorite chair as long as I like.  6. Standing 2 =  Pain prevents me from standing more than 1 hour  7. Sleeping 0 = Pain does not prevent me from sleeping well.  8. Social Life 2 = Pain prevents me from participating in more energetic activities (eg. sports, dancing).  9. Traveling 0 =  I can travel anywhere without increased pain.  10. Employment/ Homemaking 2 = I can perform most of my homemaking/job duties,  but pain prevents me from performing more physically stressful activities (eg, lifting, vacuuming).  Total 16/50=32%   Interpretation of scores: Score Category Description  0-20% Minimal Disability The patient can cope with most living activities. Usually no treatment is indicated apart from advice on lifting, sitting and exercise  21-40% Moderate Disability The patient experiences more pain and difficulty with sitting, lifting and standing. Travel and social life are more difficult and they may be disabled from work. Personal care, sexual activity and sleeping are not grossly affected, and the patient can usually be managed by conservative means  41-60% Severe Disability Pain remains the main problem in this group, but activities of daily living are affected. These patients require a detailed investigation  61-80% Crippled Back pain impinges on all aspects of the patient's life. Positive intervention is required  81-100% Bed-bound  These patients are either bed-bound or exaggerating their symptoms  Bluford FORBES Zoe DELENA Karon DELENA, et al. Surgery versus conservative management of stable thoracolumbar fracture: the PRESTO feasibility RCT. [place] ([place]): [employer]; 2021 Nov. [facility] Technology Assessment, No. 25.62.) Appendix 3, Oswestry Disability Index category descriptors. Available from: [employer]cz  Minimally Clinically Important Difference (MCID) = 12.8%  COGNITION: Overall cognitive status: Within functional  limits for tasks assessed     SENSATION: WFL  MUSCLE LENGTH: Hamstrings: tight bilaterally in sitting   POSTURE: rounded shoulders, forward head, decreased lumbar lordosis, and flexed trunk    LUMBAR ROM:   Not tested as increases pain due to ankylosis from lower thoracic to L3  LOWER EXTREMITY ROM:     wfl  LOWER EXTREMITY MMT:    MMT Right eval Left eval  Hip flexion 78.1 31.2  Hip extension    Hip abduction  25.1 30.1  Hip adduction    Hip internal rotation    Hip external rotation    Knee flexion    Knee extension    Ankle dorsiflexion    Ankle plantarflexion    Ankle inversion    Ankle eversion     (Blank rows = not tested)   FUNCTIONAL TESTS:  Timed up and go (TUG): 17.43  GAIT: Distance walked: 500 ft Assistive device utilized: None Level of assistance: Complete Independence Comments: Pt reports no limitation to amb  TREATMENT  [facility] Adult PT Treatment:                                                DATE: 01/11/24 Pt seen for aquatic therapy today.  Treatment took place in water 3.5-4.75 ft in depth at the [facility] pool. Temp of water was 91.  Pt entered/exited the pool via stairs using alternating patter with hand rail.  *Intro to setting *walking forward, back and side stepping in 3.6 ft with unsupported *L stretch x 3 knees slightly bent *HB carry for TrA engagement forward backward yellow HB with instruction for abd bracing *side stepping yellow HB ue add/abd-> side lunge *Yellow HB press for TrA engagement x 10 wide stance then staggered *noodle stomp using solid noodle and ue support yellow HB R/L hip in neutral then ER.  Pt requires vc, demonstration and various  ue support to gain motor plan and complete.   Pt requires the buoyancy and hydrostatic pressure of water for support, and to offload joints by unweighting joint load by at least 50 % in navel deep  water and by at least 75-80% in chest to neck deep water.  Viscosity of the  water is needed for resistance of strengthening. Water current perturbations provides challenge to standing balance requiring increased core activation.                                                                                                                                  PATIENT EDUCATION:  Education details: Discussed eval findings, rehab rationale, aquatic program progression/POC and pools in area. Patient is in agreement  Person educated: Patient Education method: Explanation Education comprehension: verbalized understanding  HOME EXERCISE PROGRAM: Aquatic to be assigned  ASSESSMENT:  CLINICAL IMPRESSION: Pt demonstrates safety and independence in aquatic setting with therapist instructing from deck. He is confident in setting, moving throughout all depths easily.  Pt is directed through various movement patterns and trials in both sitting and standing positions with good toleration.  He reports slight upper shoulder/cervical spine discomfort which reduces with cues for upright posture and relaxed posture. We moved quickly into core engagement activities.  Pt provided vc and demonstration for execution. Modified right hip ER position with noodle stomp as it increased right hip/sacrum pain. Will assess toleration next session with plan to assign HEP as soon as able for indep completion due to high ins copay.   Goals are ongoing.     Initial Impression Patient is a 68 y.o. m who was seen today for physical therapy evaluation and treatment for LBP without radiculopathy. He is an active senior with recent left TKR.  He presents with pain limited deficits in low thoracic and lumbar spine affecting ROM, strength, activity tolerance, gait, balance, and functional mobility with ADL's. He scores in the moderate disability category in MOI. He is a good candidate for aquatic therapy intervention and will benefit from the properties of water to progress toward goals.   OBJECTIVE  IMPAIRMENTS: Abnormal gait, decreased activity tolerance, decreased balance, decreased endurance, decreased mobility, difficulty walking, decreased ROM, decreased strength, postural dysfunction, and pain.   ACTIVITY LIMITATIONS: carrying, lifting, bending, squatting, and transfers  PARTICIPATION LIMITATIONS: cleaning, community activity, and yard work  PERSONAL FACTORS: Time since onset of injury/illness/exacerbation are also affecting patient's functional outcome.   REHAB POTENTIAL: Good  CLINICAL DECISION MAKING: Evolving/moderate complexity  EVALUATION COMPLEXITY: Moderate   GOALS: Goals reviewed with patient? Yes  SHORT TERM GOALS: Target date: 02/06/24  Pt will tolerate full aquatic sessions consistently without increase in pain and with improving function to demonstrate good toleration and effectiveness of intervention.  Baseline: Goal status: INITIAL  2.  Pt to improve on ODI by 13% (MCID)to demonstrate statistically significant Improvement in function. Baseline: 16/50=32% Goal status: INITIAL  3.  Pt will be indep with final aquatic HEP for continued management of condition. Baseline:  Goal status: INITIAL  4.  Pt will decide if aquatic intervention to manage chronic condition warrants gaining pool access for future completion of aquatic HEP  Baseline:  Goal status: INITIAL  5.  Pt will improve strength in left hip flex by 5 lbs to demonstrate improved overall physical function  Baseline: see chart Goal status: INITIAL  6.  Pt will report decrease in pain by at least 25% for improved toleration to activity/quality of life and to demonstrate improved management of pain. Baseline:  Goal status: INITIAL  LONG TERM GOALS: to be assigned as approp at re-cert  PLAN:  PT FREQUENCY: 1x/week  PT DURATION: 6 weeks  PLANNED INTERVENTIONS: 97164- PT Re-evaluation, 97750- Physical Performance Testing, 97110-Therapeutic exercises, 97530- Therapeutic activity, [id number]-  Neuromuscular re-education, 97535- Self Care, [id number]- Manual therapy, [id number]- Gait training, [phone]- Aquatic Therapy, [phone] (1-2 muscles), [id number] (3+ muscles)- Dry Needling, Patient/Family education, Balance training, Stair training, Taping, Joint mobilization, DME instructions, Cryotherapy, and Moist heat.  PLAN FOR NEXT SESSION: Aquatics: stretching and ROM of thoracic and lumbar spine; posture and body mechanic instruction. Hip strengthening (L significantly weaker >right).  HEP pt member here at [facility].  High co-pay for therapy.   Ronal Madeira) Mackenzi Krogh MPT 01/11/24 1:04 PM [facility] [address] [place], [place], [address] Phone: [phone]   Fax:  [phone]

## 2024-01-15 ENCOUNTER — Other Ambulatory Visit: Payer: Self-pay

## 2024-01-16 ENCOUNTER — Ambulatory Visit (HOSPITAL_BASED_OUTPATIENT_CLINIC_OR_DEPARTMENT_OTHER): Admitting: Physical Therapy

## 2024-01-16 ENCOUNTER — Encounter (HOSPITAL_BASED_OUTPATIENT_CLINIC_OR_DEPARTMENT_OTHER): Payer: Self-pay | Admitting: Physical Therapy

## 2024-01-16 DIAGNOSIS — M6281 Muscle weakness (generalized): Secondary | ICD-10-CM

## 2024-01-16 DIAGNOSIS — C61 Malignant neoplasm of prostate: Secondary | ICD-10-CM | POA: Diagnosis not present

## 2024-01-16 DIAGNOSIS — M5459 Other low back pain: Secondary | ICD-10-CM

## 2024-01-16 DIAGNOSIS — R2689 Other abnormalities of gait and mobility: Secondary | ICD-10-CM

## 2024-01-16 NOTE — Therapy (Signed)
 OUTPATIENT PHYSICAL THERAPY THORACOLUMBAR TREATMENT   Patient Name: Adam Glass MRN: [id number] DOB:1956-06-02, 68 y.o., male Today's Date: 01/16/2024  END OF SESSION:  PT End of Session - 01/16/24 1647     Visit Number 3    Number of Visits 8    Date for PT Re-Evaluation 02/09/24    Authorization Type [employer]    PT Start Time 1644   pt arrives late   PT Stop Time 1720    PT Time Calculation (min) 36 min    Activity Tolerance Patient tolerated treatment well    Behavior During Therapy 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   a.) surgical PCR (+) 03/28/2023 prior to LEFT TKR   OSA on CPAP    Prostate cancer (HCC)    Sleep apnea    uses CPAP   Spondylosis of lumbar region without myelopathy or radiculopathy    Tubular adenoma    Urticaria    Past Surgical History:  Procedure Laterality Date   COLONOSCOPY     COLONOSCOPY WITH PROPOFOL  N/A 10/20/2017   Procedure: COLONOSCOPY WITH PROPOFOL ;  Surgeon: Viktoria Lamar DASEN, MD;  Location: [facility] ENDOSCOPY;  Service: Endoscopy;  Laterality: N/A;   EYE SURGERY Left    Retina Tear, repaired with laser   KNEE ARTHROSCOPY Left    KNEE ARTHROSCOPY Left 11/09/2018   Procedure: ARTHROSCOPY KNEE LEFT;  Surgeon: Mardee Lynwood SQUIBB, MD;  Location: [facility] ORS;  Service: Orthopedics;  Laterality: Left;   ROTATOR CUFF REPAIR Left    TOTAL KNEE ARTHROPLASTY Left 04/03/2023   Procedure: TOTAL KNEE ARTHROPLASTY;  Surgeon: Lorelle Hussar, MD;  Location: [facility] ORS;  Service: Orthopedics;  Laterality: Left;   VASECTOMY     Patient Active Problem List   Diagnosis Date Noted   Stiffness of left knee 05/03/2023   S/P TKR (total knee replacement),  left 04/03/2023   Acute diverticulitis 01/27/2023   Hypokalemia 01/27/2023   Medicare annual wellness visit, initial 06/29/2022   Spondylosis without myelopathy or radiculopathy, lumbar region 10/09/2019   Tubular adenoma 10/26/2017   Prostate cancer (HCC) 05/03/2017   Benign essential hypertension 12/11/2015   OSA on CPAP 12/11/2015   Hypertension 09/17/2014   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  No pain or fatigue.  My L knee is hurting me more today than my back Back 2/10/ 4-5/10   Initial subjective Have a lot going on just had my knee done and have bursitis in my right hip that is better.  Dr Burnetta is wanting me to try this thinking it may help.  Dr Bonner was doubtful.  I have a membership her have done some deep water classes for my knee.  Didn't pay attention to how my back felt.  Back pain is variable.  No pain at night when I sleep.  PERTINENT HISTORY:  progressive stiffness and pain in the hips and lumbar spine. 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 Fortunately the patient has no focal neurological deficits nor any signs or symptoms of radiculopathy or myelopathy. At this point there is no structural abnormality that would require surgical intervention. I do believe the stiffness and pain that he is having is due to the  autofusion secondary to his DISH.   PAIN:  Are you having pain? Yes: NPRS scale: normal pain 4-5/10; max 6-7/10 Pain location: LB r>L Pain description: ache throb, sharp Aggravating factors: bending Relieving factors: OTC meds; lying down,  heat  PRECAUTIONS: None  RED FLAGS: None   WEIGHT BEARING RESTRICTIONS: No  FALLS:  Has patient fallen in last 6 months? No  LIVING ENVIRONMENT: Lives with: lives with their spouse Lives in: House/apartment Stairs: Yes: Internal: 16 steps; on right going up Has following equipment at home: Single point cane and Walker - 2 wheeled  OCCUPATION: retired  PLOF: Independent  PATIENT GOALS: recognize how to use water for ex; play golf  NEXT MD VISIT: as needed  OBJECTIVE:  Note: Objective measures were completed at Evaluation unless otherwise noted.  DIAGNOSTIC FINDINGS:  MRI Lumbar spine IMPRESSION: 1. Chronic DISH with interbody ankylosis from the lower thoracic spine through to at least L3, possibly developing at L3-L4, and asymmetric right SI joint ankylosis noted on the August CT. No acute or suspicious osseous lesion. 2. New lower lumbar erector spinae muscle signal abnormality from L5 to the sacrum is new since 2022 but more resembles developing muscle atrophy than muscle edema. This might be related to #1. No paraspinal mass or lymphadenopathy. 3. L4-L5 and L5-S1 increased bilateral neural foraminal stenosis since 2022 is mild to moderate. No spinal or lateral recess stenosis.  PATIENT SURVEYS:  Modified Oswestry:  MODIFIED OSWESTRY DISABILITY SCALE  Date: 01/02/24 Score  Pain intensity 4 =  Pain medication provides me with little relief from pain.  2. Personal care (washing, dressing, etc.) 1 =  I can take care of myself normally, but it increases my pain.  3. Lifting 3 = Pain prevents me from lifting heavy weights, but I can manage (5) I have hardly any social life because of my pain. light to medium weights if they  are conveniently positioned  4. Walking 1 = Pain prevents me from walking more than 1 mile.  5. Sitting 1 =  I can only sit in my favorite chair as long as I like.  6. Standing 2 =  Pain prevents me from standing more than 1 hour  7. Sleeping 0 = Pain does not prevent me from sleeping well.  8. Social Life 2 = Pain prevents me from participating in more energetic activities (eg. sports, dancing).  9. Traveling 0 =  I can travel anywhere  without increased pain.  10. Employment/ Homemaking 2 = I can perform most of my homemaking/job duties, but pain prevents me from performing more physically stressful activities (eg, lifting, vacuuming).  Total 16/50=32%   Interpretation of scores: Score Category Description  0-20% Minimal Disability The patient can cope with most living activities. Usually no treatment is indicated apart from advice on lifting, sitting and exercise  21-40% Moderate Disability The patient experiences more pain and difficulty with sitting, lifting and standing. Travel and social life are more difficult and they may be disabled from work. Personal care, sexual activity and sleeping are not grossly affected, and the patient can usually be managed by conservative means  41-60% Severe Disability Pain remains the main problem in this group, but activities of daily living are affected. These patients require a detailed investigation  61-80% Crippled Back pain impinges on all aspects of the patient's life. Positive intervention is required  81-100% Bed-bound  These patients are either bed-bound or exaggerating their symptoms  Bluford FORBES Zoe DELENA Karon DELENA, et al. Surgery versus conservative management of stable thoracolumbar fracture: the PRESTO feasibility RCT. [place]): [employer]; 2021 Nov. [facility] Technology Assessment, No. 25.62.) Appendix 3, Oswestry Disability Index category descriptors. Available from: FindJewel[employer]cz  Minimally  Clinically Important Difference (MCID) = 12.8%  COGNITION: Overall cognitive status: Within functional limits for tasks assessed     SENSATION: WFL  MUSCLE LENGTH: Hamstrings: tight bilaterally in sitting   POSTURE: rounded shoulders, forward head, decreased lumbar lordosis, and flexed trunk    LUMBAR ROM:   Not tested as increases pain due to ankylosis from lower thoracic to L3  LOWER EXTREMITY ROM:     wfl  LOWER EXTREMITY MMT:    MMT Right eval Left eval  Hip flexion 78.1 31.2  Hip extension    Hip abduction  25.1 30.1  Hip adduction    Hip internal rotation    Hip external rotation    Knee flexion    Knee extension    Ankle dorsiflexion    Ankle plantarflexion    Ankle inversion    Ankle eversion     (Blank rows = not tested)   FUNCTIONAL TESTS:  Timed up and go (TUG): 17.43  GAIT: Distance walked: 500 ft Assistive device utilized: None Level of assistance: Complete Independence Comments: Pt reports no limitation to amb  TREATMENT  [facility] Adult PT Treatment:                                                DATE: 01/16/24 Pt seen for aquatic therapy today.  Treatment took place in water 3.5-4.75 ft in depth at the [facility] pool. Temp of water was 91.  Pt entered/exited the pool via stairs using alternating patter with hand rail.  *walking forward, back and side stepping in 3.6 ft with unsupported *HB carry for TrA engagement forward backward yellow HB with instruction for abd bracing *side stepping yellow HB ue add/abd-> side lunge *Yellow HB press for TrA engagement x 10 wide stance then staggered *Noodle wrapped posteriorly across chest: cycling; hip add/abd; hip flex/ext (modified due to pt's height)  *noodle stomp using solid noodle and ue support yellow HB R/L hip in neutral then ER.  Pt requires vc, demonstration and various  ue support to gain motor plan and complete.   Pt requires the buoyancy  and hydrostatic pressure of water for  support, and to offload joints by unweighting joint load by at least 50 % in navel deep water and by at least 75-80% in chest to neck deep water.  Viscosity of the water is needed for resistance of strengthening. Water current perturbations provides challenge to standing balance requiring increased core activation.                                                                                                                                  PATIENT EDUCATION:  Education details: Discussed eval findings, rehab rationale, aquatic program progression/POC and pools in area. Patient is in agreement  Person educated: Patient Education method: Explanation Education comprehension: verbalized understanding  HOME EXERCISE PROGRAM: Aquatic to be assigned  ASSESSMENT:  CLINICAL IMPRESSION: Pt arrives late.  Therapist able to accommodate for partial time.  He reports good response from 1st visit.  Increase in left knee pain today.  Repeated core strengthening exercises from last session which he completes with improved balance/motor plans. Cues for core engagement throughout. Reduction of LBP to 0/10.  Modified postioning required for vertical decompression/ exercise in the position due to his height and lack of deeper water. Left knee with slight reduction. Plan to trial vertical suspension in cold lap pool and stretch IT bands next session.  Goals ongoing      Initial Impression Patient is a 68 y.o. m who was seen today for physical therapy evaluation and treatment for LBP without radiculopathy. He is an active senior with recent left TKR.  He presents with pain limited deficits in low thoracic and lumbar spine affecting ROM, strength, activity tolerance, gait, balance, and functional mobility with ADL's. He scores in the moderate disability category in MOI. He is a good candidate for aquatic therapy intervention and will benefit from the properties of water to progress toward goals.   OBJECTIVE  IMPAIRMENTS: Abnormal gait, decreased activity tolerance, decreased balance, decreased endurance, decreased mobility, difficulty walking, decreased ROM, decreased strength, postural dysfunction, and pain.   ACTIVITY LIMITATIONS: carrying, lifting, bending, squatting, and transfers  PARTICIPATION LIMITATIONS: cleaning, community activity, and yard work  PERSONAL FACTORS: Time since onset of injury/illness/exacerbation are also affecting patient's functional outcome.   REHAB POTENTIAL: Good  CLINICAL DECISION MAKING: Evolving/moderate complexity  EVALUATION COMPLEXITY: Moderate   GOALS: Goals reviewed with patient? Yes  SHORT TERM GOALS: Target date: 02/06/24  Pt will tolerate full aquatic sessions consistently without increase in pain and with improving function to demonstrate good toleration and effectiveness of intervention.  Baseline: Goal status: INITIAL  2.  Pt to improve on ODI by 13% (MCID)to demonstrate statistically significant Improvement in function. Baseline: 16/50=32% Goal status: INITIAL  3.  Pt will be indep with final aquatic HEP for continued management of condition. Baseline:  Goal status: INITIAL  4.  Pt will decide if aquatic intervention to manage chronic condition warrants gaining pool access for future completion of  aquatic HEP Baseline:  Goal status: INITIAL  5.  Pt will improve strength in left hip flex by 5 lbs to demonstrate improved overall physical function  Baseline: see chart Goal status: INITIAL  6.  Pt will report decrease in pain by at least 25% for improved toleration to activity/quality of life and to demonstrate improved management of pain. Baseline:  Goal status: INITIAL  LONG TERM GOALS: to be assigned as approp at re-cert  PLAN:  PT FREQUENCY: 1x/week  PT DURATION: 6 weeks  PLANNED INTERVENTIONS: 97164- PT Re-evaluation, 97750- Physical Performance Testing, 97110-Therapeutic exercises, 97530- Therapeutic activity, [id number]-  Neuromuscular re-education, 97535- Self Care, [id number]- Manual therapy, [id number]- Gait training, [phone]- Aquatic Therapy, [phone] (1-2 muscles), [id number] (3+ muscles)- Dry Needling, Patient/Family education, Balance training, Stair training, Taping, Joint mobilization, DME instructions, Cryotherapy, and Moist heat.  PLAN FOR NEXT SESSION: Aquatics: stretching and ROM of thoracic and lumbar spine; posture and body mechanic instruction. Hip strengthening (L significantly weaker >right).  HEP pt member here at [facility].  High co-pay for therapy.   [address] [place]) Corby Villasenor MPT 01/16/24 5:22 PM [facility] [address] [place], [place], [address] Phone: [phone]   Fax:  [phone]

## 2024-01-19 ENCOUNTER — Other Ambulatory Visit: Payer: Self-pay

## 2024-01-24 ENCOUNTER — Encounter (HOSPITAL_BASED_OUTPATIENT_CLINIC_OR_DEPARTMENT_OTHER): Payer: Self-pay | Admitting: Physical Therapy

## 2024-01-24 ENCOUNTER — Ambulatory Visit (HOSPITAL_BASED_OUTPATIENT_CLINIC_OR_DEPARTMENT_OTHER): Admitting: Physical Therapy

## 2024-01-24 DIAGNOSIS — M5459 Other low back pain: Secondary | ICD-10-CM

## 2024-01-24 DIAGNOSIS — M6281 Muscle weakness (generalized): Secondary | ICD-10-CM

## 2024-01-24 DIAGNOSIS — R2689 Other abnormalities of gait and mobility: Secondary | ICD-10-CM

## 2024-01-24 NOTE — Therapy (Signed)
 OUTPATIENT PHYSICAL THERAPY THORACOLUMBAR TREATMENT   Patient Name: Adam Glass MRN: [id number] DOB:1956-01-09, 68 y.o., male Today's Date: 01/24/2024  END OF SESSION:  PT End of Session - 01/24/24 0803     Visit Number 4    Number of Visits 8    Date for PT Re-Evaluation 02/09/24    Authorization Type [employer]    PT Start Time 0800    PT Stop Time 0840    PT Time Calculation (min) 40 min    Activity Tolerance Patient tolerated treatment well    Behavior During Therapy [facility] for tasks assessed/performed            Past Medical History:  Diagnosis Date   Acute diverticulitis    Allergy    seasonal, alpha-gal, shellfish   Arthritis    Atrial fibrillation (HCC)    Change in bowel habits    Constipation    Depression    Dysrhythmia    A fib   Hypertension    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   a.) surgical PCR (+) 03/28/2023 prior to LEFT TKR   OSA on CPAP    Prostate cancer (HCC)    Sleep apnea    uses CPAP   Spondylosis of lumbar region without myelopathy or radiculopathy    Tubular adenoma    Urticaria    Past Surgical History:  Procedure Laterality Date   COLONOSCOPY     COLONOSCOPY WITH PROPOFOL  N/A 10/20/2017   Procedure: COLONOSCOPY WITH PROPOFOL ;  Surgeon: Viktoria Lamar DASEN, MD;  Location: [facility] END[facility];  Service: Endoscopy;  Laterality: N/A;   EYE SURGERY Left    Retina Tear, repaired with laser   KNEE ARTHROSCOPY Left    KNEE ARTHROSCOPY Left 11/09/2018   Procedure: ARTHROSCOPY KNEE LEFT;  Surgeon: Mardee Lynwood SQUIBB, MD;  Location: [facility] ORS;  Service: Orthopedics;  Laterality: Left;   ROTATOR CUFF REPAIR Left    TOTAL KNEE ARTHROPLASTY Left 04/03/2023   Procedure: TOTAL KNEE ARTHROPLASTY;  Surgeon: Lorelle Hussar, MD;  Location: [facility] ORS;  Service: Orthopedics;  Laterality: Left;   VASECTOMY     Patient Active Problem List   Diagnosis Date Noted   Stiffness of left knee 05/03/2023   S/P TKR (total knee replacement), left 04/03/2023    Acute diverticulitis 01/27/2023   Hypokalemia 01/27/2023   Medicare annual wellness visit, initial 06/29/2022   Spondylosis without myelopathy or radiculopathy, lumbar region 10/09/2019   Tubular adenoma 10/26/2017   Prostate cancer (HCC) 05/03/2017   Benign essential hypertension 12/11/2015   OSA on CPAP 12/11/2015   Hypertension 09/17/2014   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 6-7/10.  Didn't sleep well.   Initial subjective Have a lot going on just had my knee done and have bursitis in my right hip that is better.  Dr Burnetta is wanting me to try this thinking it may help.  Dr Bonner was doubtful.  I have a membership her have done some deep water classes for my knee.  Didn't pay attention to how my back felt.  Back pain is variable.  No pain at night when I sleep.  PERTINENT HISTORY:  progressive stiffness and pain in the hips and lumbar spine. 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 Fortunately the patient has no focal neurological deficits nor any signs or symptoms of radiculopathy or myelopathy. At this point there is no structural abnormality that would require surgical intervention. I do believe the stiffness and pain that he is having is due to the autofusion secondary to his DISH.   PAIN:  Are you having pain? Yes: NPRS  scale: normal pain 4-5/10; max 6-7/10 Pain location: LB r>L Pain description: ache throb, sharp Aggravating factors: bending Relieving factors: OTC meds; lying down,  heat  PRECAUTIONS: None  RED FLAGS: None   WEIGHT BEARING RESTRICTIONS: No  FALLS:  Has patient fallen in last 6 months? No  LIVING ENVIRONMENT: Lives with: lives with their spouse Lives in: House/apartment Stairs: Yes: Internal: 16 steps; on right going up Has following equipment at home: Single point cane and Walker - 2 wheeled  OCCUPATION: retired  PLOF: Independent  PATIENT GOALS: recognize how to use water for ex; play golf  NEXT MD VISIT: as needed  OBJECTIVE:  Note: Objective measures were completed at Evaluation unless otherwise noted.  DIAGNOSTIC FINDINGS:  MRI Lumbar spine IMPRESSION: 1. Chronic DISH with interbody ankylosis from the lower thoracic spine through to at least L3, possibly developing at L3-L4, and asymmetric right SI joint ankylosis noted on the August CT. No acute or suspicious osseous lesion. 2. New lower lumbar erector spinae muscle signal abnormality from L5 to the sacrum is new since 2022 but more resembles developing muscle atrophy than muscle edema. This might be related to #1. No paraspinal mass or lymphadenopathy. 3. L4-L5 and L5-S1 increased bilateral neural foraminal stenosis since 2022 is mild to moderate. No spinal or lateral recess stenosis.  PATIENT SURVEYS:  Modified Oswestry:  MODIFIED OSWESTRY DISABILITY SCALE  Date: 01/02/24 Score  Pain intensity 4 =  Pain medication provides me with little relief from pain.  2. Personal care (washing, dressing, etc.) 1 =  I can take care of myself normally, but it increases my pain.  3. Lifting 3 = Pain prevents me from lifting heavy weights, but I can manage (5) I have hardly any social life because of my pain. light to medium weights if they are conveniently positioned  4. Walking 1 = Pain prevents me from walking more  than 1 mile.  5. Sitting 1 =  I can only sit in my favorite chair as long as I like.  6. Standing 2 =  Pain prevents me from standing more than 1 hour  7. Sleeping 0 = Pain does not prevent me from sleeping well.  8. Social Life 2 = Pain prevents me from participating in more energetic activities (eg. sports, dancing).  9. Traveling 0 =  I can travel anywhere without increased pain.  10. Employment/ Homemaking 2 = I can perform most  of my homemaking/job duties, but pain prevents me from performing more physically stressful activities (eg, lifting, vacuuming).  Total 16/50=32%   Interpretation of scores: Score Category Description  0-20% Minimal Disability The patient can cope with most living activities. Usually no treatment is indicated apart from advice on lifting, sitting and exercise  21-40% Moderate Disability The patient experiences more pain and difficulty with sitting, lifting and standing. Travel and social life are more difficult and they may be disabled from work. Personal care, sexual activity and sleeping are not grossly affected, and the patient can usually be managed by conservative means  41-60% Severe Disability Pain remains the main problem in this group, but activities of daily living are affected. These patients require a detailed investigation  61-80% Crippled Back pain impinges on all aspects of the patient's life. Positive intervention is required  81-100% Bed-bound  These patients are either bed-bound or exaggerating their symptoms  Bluford FORBES Zoe DELENA Karon DELENA, et al. Surgery versus conservative management of stable thoracolumbar fracture: the PRESTO feasibility RCT. [place]): [employer]; 2021 Nov. [facility] Technology Assessment, No. 25.62.) Appendix 3, Oswestry Disability Index category descriptors. Available from: FindJewel[employer].cz  Minimally Clinically Important Difference (MCID) = 12.8%  COGNITION: Overall cognitive  status: Within functional limits for tasks assessed     SENSATION: WFL  MUSCLE LENGTH: Hamstrings: tight bilaterally in sitting   POSTURE: rounded shoulders, forward head, decreased lumbar lordosis, and flexed trunk    LUMBAR ROM:   Not tested as increases pain due to ankylosis from lower thoracic to L3  LOWER EXTREMITY ROM:     wfl  LOWER EXTREMITY MMT:    MMT Right eval Left eval  Hip flexion 78.1 31.2  Hip extension    Hip abduction  25.1 30.1  Hip adduction    Hip internal rotation    Hip external rotation    Knee flexion    Knee extension    Ankle dorsiflexion    Ankle plantarflexion    Ankle inversion    Ankle eversion     (Blank rows = not tested)   FUNCTIONAL TESTS:  Timed up and go (TUG): 17.43  GAIT: Distance walked: 500 ft Assistive device utilized: None Level of assistance: Complete Independence Comments: Pt reports no limitation to amb  TREATMENT  [facility] Adult PT Treatment:                                                DATE: 01/24/24 Pt seen for aquatic therapy today.  Treatment took place in water 3.5-4.75 ft in depth at the [facility] pool. Temp of water was 91.  Pt entered/exited the pool via stairs using alternating patter with hand rail.  *walking forward, back and side stepping in 3.6 ft with unsupported *L stretch x 4 *hip hinging 4.6 ft verbal and tactile cues for execution x 3 reps (increased right sided low back discomfort) *Noodle wrapped posteriorly then anteriorly across chest: cycling; hip add/abd holding to wall: knees to chest from vertical with hold in KTC *Yellow noodle press for TrA engagement x 10 staggered stance *thoracic rotation ue support yellow noodle *walking between exercises for recovery     Pt requires the buoyancy and hydrostatic pressure of water for support, and to offload joints by unweighting joint load by at least 50 % in navel deep water and by at  least 75-80% in chest to neck deep water.   Viscosity of the water is needed for resistance of strengthening. Water current perturbations provides challenge to standing balance requiring increased core activation.                                                                                                                                  PATIENT EDUCATION:  Education details: Discussed eval findings, rehab rationale, aquatic program progression/POC and pools in area. Patient is in agreement  Person educated: Patient Education method: Explanation Education comprehension: verbalized understanding  HOME EXERCISE PROGRAM: Aquatic to be assigned  ASSESSMENT:  CLINICAL IMPRESSION: Increased pain today right sided LB causing a restless night. Completed 2-3 hip hinging deeply submerged to engage lumbar musculature but not tolerated. Lumbar distraction using resistant yellow 2 foam buoys and vertically suspended exercises were helpful in reducing discomfort. He was able to tolerate isometric lumbar engagement with good toleration. Cues for all movements to be mindful. VC provided throughout for reduced guarded posture with exercise. Discussion around activities submerged for pain management of chronic condition.   Initial Impression Patient is a 68 y.o. m who was seen today for physical therapy evaluation and treatment for LBP without radiculopathy. He is an active senior with recent left TKR.  He presents with pain limited deficits in low thoracic and lumbar spine affecting ROM, strength, activity tolerance, gait, balance, and functional mobility with ADL's. He scores in the moderate disability category in MOI. He is a good candidate for aquatic therapy intervention and will benefit from the properties of water to progress toward goals.   OBJECTIVE IMPAIRMENTS: Abnormal gait, decreased activity tolerance, decreased balance, decreased endurance, decreased mobility, difficulty walking, decreased ROM, decreased strength, postural  dysfunction, and pain.   ACTIVITY LIMITATIONS: carrying, lifting, bending, squatting, and transfers  PARTICIPATION LIMITATIONS: cleaning, community activity, and yard work  PERSONAL FACTORS: Time since onset of injury/illness/exacerbation are also affecting patient's functional outcome.   REHAB POTENTIAL: Good  CLINICAL DECISION MAKING: Evolving/moderate complexity  EVALUATION COMPLEXITY: Moderate   GOALS: Goals reviewed with patient? Yes  SHORT TERM GOALS: Target date: 02/06/24  Pt will tolerate full aquatic sessions consistently without increase in pain and with improving function to demonstrate good toleration and effectiveness of intervention.  Baseline: Goal status: Met 01/24/24  2.  Pt to improve on ODI by 13% (MCID)to demonstrate statistically significant Improvement in function. Baseline: 16/50=32% Goal status: INITIAL  3.  Pt will be indep with final aquatic HEP for continued management of condition. Baseline:  Goal status: INITIAL  4.  Pt will decide if aquatic intervention to manage chronic condition warrants gaining pool access for future completion of aquatic HEP Baseline:  Goal status: INITIAL  5.  Pt will improve strength in left hip flex by 5 lbs to demonstrate improved overall physical function  Baseline: see chart Goal status: INITIAL  6.  Pt will report decrease in pain by at least  25% for improved toleration to activity/quality of life and to demonstrate improved management of pain. Baseline:  Goal status: INITIAL  LONG TERM GOALS: to be assigned as approp at re-cert  PLAN:  PT FREQUENCY: 1x/week  PT DURATION: 6 weeks  PLANNED INTERVENTIONS: 97164- PT Re-evaluation, 97750- Physical Performance Testing, 97110-Therapeutic exercises, 97530- Therapeutic activity, [id number]- Neuromuscular re-education, 97535- Self Care, [id number]- Manual therapy, [id number]- Gait training, [phone]- Aquatic Therapy, [phone] (1-2 muscles), [id number] (3+ muscles)- Dry Needling, Patient/Family  education, Balance training, Stair training, Taping, Joint mobilization, DME instructions, Cryotherapy, and Moist heat.  PLAN FOR NEXT SESSION: Aquatics: stretching and ROM of thoracic and lumbar spine; posture and body mechanic instruction. Hip strengthening (L significantly weaker >right).  HEP pt member here at [facility].  High co-pay for therapy.   [address] [place]) Garrit Marrow MPT 01/24/24 8:35 AM [facility] [address] [place], [place], [address] Phone: [phone]   Fax:  [phone]

## 2024-01-25 DIAGNOSIS — M5440 Lumbago with sciatica, unspecified side: Secondary | ICD-10-CM | POA: Diagnosis not present

## 2024-01-26 ENCOUNTER — Other Ambulatory Visit: Payer: Self-pay

## 2024-01-26 DIAGNOSIS — I10 Essential (primary) hypertension: Secondary | ICD-10-CM | POA: Diagnosis not present

## 2024-01-26 DIAGNOSIS — F17211 Nicotine dependence, cigarettes, in remission: Secondary | ICD-10-CM | POA: Diagnosis not present

## 2024-01-26 DIAGNOSIS — L501 Idiopathic urticaria: Secondary | ICD-10-CM | POA: Diagnosis not present

## 2024-01-26 DIAGNOSIS — Z008 Encounter for other general examination: Secondary | ICD-10-CM | POA: Diagnosis not present

## 2024-01-26 DIAGNOSIS — D84821 Immunodeficiency due to drugs: Secondary | ICD-10-CM | POA: Diagnosis not present

## 2024-01-26 DIAGNOSIS — Z91014 Allergy to mammalian meats: Secondary | ICD-10-CM | POA: Diagnosis not present

## 2024-01-26 NOTE — Progress Notes (Signed)
 Specialty Pharmacy Refill Coordination Note  JOHANNA STAFFORD is a 68 y.o. male assessed today regarding refills of clinic administered specialty medication(s) Omalizumab  (XOLAIR )   Clinic requested Courier to Provider Office   Delivery date: 01/30/24   Injection date: 02/07/24  Verified address: [address] [place] [address]   Medication will be filled on 01/29/24.

## 2024-01-29 ENCOUNTER — Ambulatory Visit

## 2024-01-30 ENCOUNTER — Encounter (HOSPITAL_BASED_OUTPATIENT_CLINIC_OR_DEPARTMENT_OTHER): Payer: Self-pay | Admitting: Physical Therapy

## 2024-01-30 ENCOUNTER — Ambulatory Visit (HOSPITAL_BASED_OUTPATIENT_CLINIC_OR_DEPARTMENT_OTHER): Admitting: Physical Therapy

## 2024-01-30 DIAGNOSIS — M5459 Other low back pain: Secondary | ICD-10-CM

## 2024-01-30 DIAGNOSIS — M6281 Muscle weakness (generalized): Secondary | ICD-10-CM

## 2024-01-30 DIAGNOSIS — R2689 Other abnormalities of gait and mobility: Secondary | ICD-10-CM

## 2024-01-30 NOTE — Therapy (Signed)
 OUTPATIENT PHYSICAL THERAPY THORACOLUMBAR TREATMENT   Patient Name: Adam Glass MRN: [id number] DOB:[birth year], 68 y.o., male Today's Date: 01/30/2024  END OF SESSION:  PT End of Session - 01/30/24 1044     Visit Number 5    Number of Visits 8    Date for PT Re-Evaluation 02/09/24    Authorization Type [employer]    PT Start Time 1015    PT Stop Time 1055    PT Time Calculation (min) 40 min    Activity Tolerance Patient tolerated treatment well    Behavior During Therapy [facility] for tasks assessed/performed             Past Medical History:  Diagnosis Date   Acute diverticulitis    Allergy    seasonal, alpha-gal, shellfish   Arthritis    Atrial fibrillation (HCC)    Change in bowel habits    Constipation    Depression    Dysrhythmia    A fib   Hypertension    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   a.) surgical PCR (+) 03/28/2023 prior to LEFT TKR   OSA on CPAP    Prostate cancer (HCC)    Sleep apnea    uses CPAP   Spondylosis of lumbar region without myelopathy or radiculopathy    Tubular adenoma    Urticaria    Past Surgical History:  Procedure Laterality Date   COLONOSCOPY     COLONOSCOPY WITH PROPOFOL  N/A 10/20/2017   Procedure: COLONOSCOPY WITH PROPOFOL ;  Surgeon: Viktoria Lamar DASEN, MD;  Location: [facility] ENDOSCOPY;  Service: Endoscopy;  Laterality: N/A;   EYE SURGERY Left    Retina Tear, repaired with laser   KNEE ARTHROSCOPY Left    KNEE ARTHROSCOPY Left 11/09/2018   Procedure: ARTHROSCOPY KNEE LEFT;  Surgeon: Mardee Lynwood SQUIBB, MD;  Location: [facility] ORS;  Service: Orthopedics;  Laterality: Left;   ROTATOR CUFF REPAIR Left    TOTAL KNEE ARTHROPLASTY Left 04/03/2023   Procedure: TOTAL KNEE ARTHROPLASTY;  Surgeon: Lorelle Hussar, MD;  Location: [facility] ORS;  Service: Orthopedics;  Laterality: Left;   VASECTOMY     Patient Active Problem List   Diagnosis Date Noted   Stiffness of left knee 05/03/2023   S/P TKR (total knee replacement), left 04/03/2023    Acute diverticulitis 01/27/2023   Hypokalemia 01/27/2023   Medicare annual wellness visit, initial 06/29/2022   Spondylosis without myelopathy or radiculopathy, lumbar region 10/09/2019   Tubular adenoma 10/26/2017   Prostate cancer (HCC) 05/03/2017   Benign essential hypertension 12/11/2015   OSA on CPAP 12/11/2015   Hypertension 09/17/2014   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  Mornings are still the worst.  Saw Dr Onetha (neuro) suggested DN.   Initial subjective Have a lot going on just had my knee done and have bursitis in my right hip that is better.  Dr Burnetta is wanting me to try this thinking it may help.  Dr Bonner was doubtful.  I have a membership her have done some deep water classes for my knee.  Didn't pay attention to how my back felt.  Back pain is variable.  No pain at night when I sleep.  PERTINENT HISTORY:  progressive stiffness and pain in the hips and lumbar spine. 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 Fortunately the patient has no focal neurological deficits nor any signs or symptoms of radiculopathy or myelopathy. At this point there is no structural abnormality that would require surgical intervention. I do believe the stiffness and pain that he is having is due to the autofusion secondary to his DISH.   PAIN:  Are you having pain?  Yes: NPRS scale: normal pain 4-5/10; max 6-7/10 Pain location: LB r>L Pain description: ache throb, sharp Aggravating factors: bending Relieving factors: OTC meds; lying down,  heat  PRECAUTIONS: None  RED FLAGS: None   WEIGHT BEARING RESTRICTIONS: No  FALLS:  Has patient fallen in last 6 months? No  LIVING ENVIRONMENT: Lives with: lives with their spouse Lives in: House/apartment Stairs: Yes: Internal: 16 steps; on right going up Has following equipment at home: Single point cane and Walker - 2 wheeled  OCCUPATION: retired  PLOF: Independent  PATIENT GOALS: recognize how to use water for ex; play golf  NEXT MD VISIT: as needed  OBJECTIVE:  Note: Objective measures were completed at Evaluation unless otherwise noted.  DIAGNOSTIC FINDINGS:  MRI Lumbar spine IMPRESSION: 1. Chronic DISH with interbody ankylosis from the lower thoracic spine through to at least L3, possibly developing at L3-L4, and asymmetric right SI joint ankylosis noted on the August CT. No acute or suspicious osseous lesion. 2. New lower lumbar erector spinae muscle signal abnormality from L5 to the sacrum is new since 2022 but more resembles developing muscle atrophy than muscle edema. This might be related to #1. No paraspinal mass or lymphadenopathy. 3. L4-L5 and L5-S1 increased bilateral neural foraminal stenosis since 2022 is mild to moderate. No spinal or lateral recess stenosis.  PATIENT SURVEYS:  Modified Oswestry:  MODIFIED OSWESTRY DISABILITY SCALE  Date: 01/02/24 Score  Pain intensity 4 =  Pain medication provides me with little relief from pain.  2. Personal care (washing, dressing, etc.) 1 =  I can take care of myself normally, but it increases my pain.  3. Lifting 3 = Pain prevents me from lifting heavy weights, but I can manage (5) I have hardly any social life because of my pain. light to medium weights if they are conveniently positioned  4. Walking 1 = Pain prevents me from  walking more than 1 mile.  5. Sitting 1 =  I can only sit in my favorite chair as long as I like.  6. Standing 2 =  Pain prevents me from standing more than 1 hour  7. Sleeping 0 = Pain does not prevent me from sleeping well.  8. Social Life 2 = Pain prevents me from participating in more energetic activities (eg. sports, dancing).  9. Traveling 0 =  I can travel anywhere without increased pain.  10. Employment/ Homemaking 2 = I  can perform most of my homemaking/job duties, but pain prevents me from performing more physically stressful activities (eg, lifting, vacuuming).  Total 16/50=32%   Interpretation of scores: Score Category Description  0-20% Minimal Disability The patient can cope with most living activities. Usually no treatment is indicated apart from advice on lifting, sitting and exercise  21-40% Moderate Disability The patient experiences more pain and difficulty with sitting, lifting and standing. Travel and social life are more difficult and they may be disabled from work. Personal care, sexual activity and sleeping are not grossly affected, and the patient can usually be managed by conservative means  41-60% Severe Disability Pain remains the main problem in this group, but activities of daily living are affected. These patients require a detailed investigation  61-80% Crippled Back pain impinges on all aspects of the patient's life. Positive intervention is required  81-100% Bed-bound  These patients are either bed-bound or exaggerating their symptoms  Bluford FORBES Zoe DELENA Karon DELENA, et al. Surgery versus conservative management of stable thoracolumbar fracture: the PRESTO feasibility RCT. [place] ([place]): [employer]; 2021 Nov. [facility] Technology Assessment, No. 25.62.) Appendix 3, Oswestry Disability Index category descriptors. Available from: FindJewel[employer].cz  Minimally Clinically Important Difference (MCID) = 12.8%  COGNITION: Overall  cognitive status: Within functional limits for tasks assessed     SENSATION: WFL  MUSCLE LENGTH: Hamstrings: tight bilaterally in sitting   POSTURE: rounded shoulders, forward head, decreased lumbar lordosis, and flexed trunk    LUMBAR ROM:   Not tested as increases pain due to ankylosis from lower thoracic to L3  LOWER EXTREMITY ROM:     wfl  LOWER EXTREMITY MMT:    MMT Right eval Left eval  Hip flexion 78.1 31.2  Hip extension    Hip abduction  25.1 30.1  Hip adduction    Hip internal rotation    Hip external rotation    Knee flexion    Knee extension    Ankle dorsiflexion    Ankle plantarflexion    Ankle inversion    Ankle eversion     (Blank rows = not tested)   FUNCTIONAL TESTS:  Timed up and go (TUG): 17.43  GAIT: Distance walked: 500 ft Assistive device utilized: None Level of assistance: Complete Independence Comments: Pt reports no limitation to amb  TREATMENT  [facility] Adult PT Treatment:                                                DATE: 01/30/24 Pt seen for aquatic therapy today.  Treatment took place in water 3.5-4.75 ft in depth at the [facility] pool. Temp of water was 91.  Pt entered/exited the pool via stairs using alternating patter with hand rail.  *walking forward, back and side stepping in 3.6 ft with unsupported *L stretch x 4 *bow and arrow *hip hinging 4.6 ft verbal and tactile cues for execution x 5. VC and Tc for execution *Yellow noodle press for TrA engagement x 10 staggered stance then wide stance *thoracic rotation ue support yellow noodle *Noodle wrapped posteriorly then anteriorly across chest: cycling; hip add/abd holding to wall: knees to chest from vertical with hold in KTC *walking between exercises for recovery  Pt requires the buoyancy and hydrostatic pressure of water for support, and to offload joints by unweighting joint load by at least 50 % in navel deep  water and by at least 75-80% in chest to neck deep  water.  Viscosity of the water is needed for resistance of strengthening. Water current perturbations provides challenge to standing balance requiring increased core activation.                                                                                                                                  PATIENT EDUCATION:  Education details: Discussed eval findings, rehab rationale, aquatic program progression/POC and pools in area. Patient is in agreement  Person educated: Patient Education method: Explanation Education comprehension: verbalized understanding  HOME EXERCISE PROGRAM: Aquatic HEP This aquatic home exercise program from [employer] utilizes pictures from land based exercises, but has been adapted prior to lamination and issuance.    Access Code: [email or web address] URL: [email or web address] Date: 01/30/2024 Prepared by: Frankie Jetson Pickrel **not issued  Exercises - Standing 'L' Stretch at [facility]  - 1 x daily - 1-3 x weekly - 1 sets - 3 reps - Bow and Arrow   - 1 x daily - 1-3 x weekly - 1-2 sets - 10 reps - Standing Hip Hinge  - 1 x daily - 1-3 x weekly - 1-2 sets - 10 reps - Noodle press  - 1 x daily - 1-3 x weekly - 1-2 sets - 10 reps - Cycling on noodle/noodle wrapped under shoulders  - 1 x daily - 1-3 x weekly - Wall Push Up  - 1 x daily - 1-3 x weekly - 1-2 sets - 10 reps - Standing Hamstring Stretch on Chair  - 1 x daily - 1-3 x weekly - 1 sets - 3 reps - 15-20s hold - Gastroc Stretch on Step  - 1 x daily - 1-3 x weekly - 1 sets - 3 reps - 15-20s hold  ASSESSMENT:  CLINICAL IMPRESSION: Pt with new order for DN but has appt at a different clinic.  I offered it here, he will decide then call for appt. Improved toleration to hip hinging.  Increased stretching today in hamstrings and gastroc.  He tolerates some thoracic rotation as a good stretch  instructed to be mindful with stretching keeping it in a hurt so good level not all out pain. Overall pain has  remained the same although he does gain almost full pain relief while submerged interrupting his pain cycle. Began creating aquatic HEP. Plan to instruct and issue next session then DC as he will have met his max potential in setting     Initial Impression Patient is a 68 y.o. m who was seen today for physical therapy evaluation and treatment for LBP without radiculopathy. He is an active senior with recent left TKR.  He presents with pain limited deficits in low thoracic and lumbar spine affecting ROM, strength, activity tolerance, gait, balance, and functional mobility with ADL's. He scores in the moderate disability category in MOI. He is a good candidate for aquatic therapy intervention and will  benefit from the properties of water to progress toward goals.   OBJECTIVE IMPAIRMENTS: Abnormal gait, decreased activity tolerance, decreased balance, decreased endurance, decreased mobility, difficulty walking, decreased ROM, decreased strength, postural dysfunction, and pain.   ACTIVITY LIMITATIONS: carrying, lifting, bending, squatting, and transfers  PARTICIPATION LIMITATIONS: cleaning, community activity, and yard work  PERSONAL FACTORS: Time since onset of injury/illness/exacerbation are also affecting patient's functional outcome.   REHAB POTENTIAL: Good  CLINICAL DECISION MAKING: Evolving/moderate complexity  EVALUATION COMPLEXITY: Moderate   GOALS: Goals reviewed with patient? Yes  SHORT TERM GOALS: Target date: 02/06/24  Pt will tolerate full aquatic sessions consistently without increase in pain and with improving function to demonstrate good toleration and effectiveness of intervention.  Baseline: Goal status: Met 01/24/24  2.  Pt to improve on ODI by 13% (MCID)to demonstrate statistically significant Improvement in function. Baseline: 16/50=32% Goal status: INITIAL  3.  Pt will be indep with final aquatic HEP for continued management of condition. Baseline:  Goal status:  INITIAL  4.  Pt will decide if aquatic intervention to manage chronic condition warrants gaining pool access for future completion of aquatic HEP Baseline:  Goal status: INITIAL  5.  Pt will improve strength in left hip flex by 5 lbs to demonstrate improved overall physical function  Baseline: see chart Goal status: INITIAL  6.  Pt will report decrease in pain by at least 25% for improved toleration to activity/quality of life and to demonstrate improved management of pain. Baseline:  Goal status: INITIAL  LONG TERM GOALS: to be assigned as approp at re-cert  PLAN:  PT FREQUENCY: 1x/week  PT DURATION: 6 weeks  PLANNED INTERVENTIONS: 97164- PT Re-evaluation, 97750- Physical Performance Testing, 97110-Therapeutic exercises, 97530- Therapeutic activity, [id number]- Neuromuscular re-education, 97535- Self Care, [id number]- Manual therapy, [id number]- Gait training, [phone]- Aquatic Therapy, [phone] (1-2 muscles), [id number] (3+ muscles)- Dry Needling, Patient/Family education, Balance training, Stair training, Taping, Joint mobilization, DME instructions, Cryotherapy, and Moist heat.  PLAN FOR NEXT SESSION: Aquatics: stretching and ROM of thoracic and lumbar spine; posture and body mechanic instruction. Hip strengthening (L significantly weaker >right).  HEP pt member here at [facility].  High co-pay for therapy.   [address] [place]) Biannca Scantlin MPT 01/30/24 10:45 AM [facility] [address] [place], [place], [address] Phone: [phone]   Fax:  [phone]

## 2024-01-31 DIAGNOSIS — M5440 Lumbago with sciatica, unspecified side: Secondary | ICD-10-CM | POA: Diagnosis not present

## 2024-02-06 ENCOUNTER — Ambulatory Visit (HOSPITAL_BASED_OUTPATIENT_CLINIC_OR_DEPARTMENT_OTHER): Attending: Orthopedic Surgery | Admitting: Physical Therapy

## 2024-02-06 ENCOUNTER — Encounter (HOSPITAL_BASED_OUTPATIENT_CLINIC_OR_DEPARTMENT_OTHER): Payer: Self-pay | Admitting: Physical Therapy

## 2024-02-06 DIAGNOSIS — M5459 Other low back pain: Secondary | ICD-10-CM | POA: Diagnosis not present

## 2024-02-06 DIAGNOSIS — R2689 Other abnormalities of gait and mobility: Secondary | ICD-10-CM | POA: Insufficient documentation

## 2024-02-06 DIAGNOSIS — M6281 Muscle weakness (generalized): Secondary | ICD-10-CM | POA: Diagnosis not present

## 2024-02-06 NOTE — Therapy (Signed)
 OUTPATIENT PHYSICAL THERAPY THORACOLUMBAR TREATMENT   Patient Name: Adam Glass MRN: [id number] DOB:03-22-1956, 68 y.o., male Today's Date: 02/06/2024  END OF SESSION:  PT End of Session - 02/06/24 9077     Visit Number 6    Number of Visits 10    Date for PT Re-Evaluation 03/08/24    Authorization Type [employer]    PT Start Time 0845    PT Stop Time 0923    PT Time Calculation (min) 38 min    Activity Tolerance Patient tolerated treatment well    Behavior During Therapy [facility] for tasks assessed/performed              Past Medical History:  Diagnosis Date   Acute diverticulitis    Allergy    seasonal, alpha-gal, shellfish   Arthritis    Atrial fibrillation (HCC)    Change in bowel habits    Constipation    Depression    Dysrhythmia    A fib   Hypertension    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   a.) surgical PCR (+) 03/28/2023 prior to LEFT TKR   OSA on CPAP    Prostate cancer (HCC)    Sleep apnea    uses CPAP   Spondylosis of lumbar region without myelopathy or radiculopathy    Tubular adenoma    Urticaria    Past Surgical History:  Procedure Laterality Date   COLONOSCOPY     COLONOSCOPY WITH PROPOFOL  N/A 10/20/2017   Procedure: COLONOSCOPY WITH PROPOFOL ;  Surgeon: Viktoria Lamar DASEN, MD;  Location: [facility];  Service: Endoscopy;  Laterality: N/A;   EYE SURGERY Left    Retina Tear, repaired with laser   KNEE ARTHROSCOPY Left    KNEE ARTHROSCOPY Left 11/09/2018   Procedure: ARTHROSCOPY KNEE LEFT;  Surgeon: Mardee Lynwood SQUIBB, MD;  Location: [facility] ORS;  Service: Orthopedics;  Laterality: Left;   ROTATOR CUFF REPAIR Left    TOTAL KNEE ARTHROPLASTY Left 04/03/2023   Procedure: TOTAL KNEE ARTHROPLASTY;  Surgeon: Lorelle Hussar, MD;  Location: [facility] ORS;  Service: Orthopedics;  Laterality: Left;   VASECTOMY     Patient Active Problem List   Diagnosis Date Noted   Stiffness of left knee 05/03/2023   S/P TKR (total knee replacement), left  04/03/2023   Acute diverticulitis 01/27/2023   Hypokalemia 01/27/2023   Medicare annual wellness visit, initial 06/29/2022   Spondylosis without myelopathy or radiculopathy, lumbar region 10/09/2019   Tubular adenoma 10/26/2017   Prostate cancer (HCC) 05/03/2017   Benign essential hypertension 12/11/2015   OSA on CPAP 12/11/2015   Hypertension 09/17/2014   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  Sharp pains when I move in certain directions (rotating right or lifting rle)  I canceled my appt to have it done again today   Initial subjective Have a lot going on just had my knee done and have bursitis in my right hip that is better.  Dr Burnetta is wanting me to try this thinking it may help.  Dr Bonner was doubtful.  I have a membership her have done some deep water classes for my knee.  Didn't pay attention to how my back felt.  Back pain is variable.  No pain at night when I sleep.  PERTINENT HISTORY:  progressive stiffness and pain in the hips and lumbar spine. 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 Fortunately the patient has no focal neurological deficits nor any signs or symptoms of radiculopathy or myelopathy. At this point there is no structural abnormality that would require surgical intervention. I do  believe the stiffness and pain that he is having is due to the autofusion secondary to his DISH.   PAIN:  Are you having pain? Yes: NPRS scale: normal pain 4-5/10; max 6-7/10 Pain location: LB r>L Pain description: ache throb, sharp Aggravating factors: bending Relieving factors: OTC meds; lying down,  heat  PRECAUTIONS: None  RED FLAGS: None   WEIGHT BEARING RESTRICTIONS: No  FALLS:  Has patient fallen in last 6 months? No  LIVING ENVIRONMENT: Lives with: lives with their spouse Lives in: House/apartment Stairs: Yes: Internal: 16 steps; on right going up Has following equipment at home: Single point cane and Walker - 2 wheeled  OCCUPATION: retired  PLOF: Independent  PATIENT GOALS: recognize how to use water for ex; play golf  NEXT MD VISIT: as needed  OBJECTIVE:  Note: Objective measures were completed at Evaluation unless otherwise noted.  DIAGNOSTIC FINDINGS:  MRI Lumbar spine IMPRESSION: 1. Chronic DISH with interbody ankylosis from the lower thoracic spine through to at least L3, possibly developing at L3-L4, and asymmetric right SI joint ankylosis noted on the August CT. No acute or suspicious osseous lesion. 2. New lower lumbar erector spinae muscle signal abnormality from L5 to the sacrum is new since 2022 but more resembles developing muscle atrophy than muscle edema. This might be related to #1. No paraspinal mass or lymphadenopathy. 3. L4-L5 and L5-S1 increased bilateral neural foraminal stenosis since 2022 is mild to moderate. No spinal or lateral recess stenosis.  PATIENT SURVEYS:  Modified Oswestry:  MODIFIED OSWESTRY DISABILITY SCALE  Date: 01/02/24 Score  Pain intensity 4 =  Pain medication provides me with little relief from pain.  2. Personal care (washing, dressing, etc.) 1 =  I can take care of myself normally, but it increases my pain.  3. Lifting 3 = Pain prevents me from lifting heavy weights, but I can manage (5) I have hardly any  social life because of my pain. light to medium weights if they are conveniently positioned  4. Walking 1 = Pain prevents me from walking more than 1 mile.  5. Sitting 1 =  I can only sit in my favorite chair as long as I like.  6. Standing 2 =  Pain prevents me from standing more than 1 hour  7. Sleeping 0 = Pain does not prevent me from sleeping well.  8. Social Life 2 = Pain prevents me from participating in more  energetic activities (eg. sports, dancing).  9. Traveling 0 =  I can travel anywhere without increased pain.  10. Employment/ Homemaking 2 = I can perform most of my homemaking/job duties, but pain prevents me from performing more physically stressful activities (eg, lifting, vacuuming).  Total 16/50=32%   Interpretation of scores: Score Category Description  0-20% Minimal Disability The patient can cope with most living activities. Usually no treatment is indicated apart from advice on lifting, sitting and exercise  21-40% Moderate Disability The patient experiences more pain and difficulty with sitting, lifting and standing. Travel and social life are more difficult and they may be disabled from work. Personal care, sexual activity and sleeping are not grossly affected, and the patient can usually be managed by conservative means  41-60% Severe Disability Pain remains the main problem in this group, but activities of daily living are affected. These patients require a detailed investigation  61-80% Crippled Back pain impinges on all aspects of the patient's life. Positive intervention is required  81-100% Bed-bound  These patients are either bed-bound or exaggerating their symptoms  Bluford FORBES Zoe DELENA Karon DELENA, et al. Surgery versus conservative management of stable thoracolumbar fracture: the PRESTO feasibility RCT. [place]): [employer]; 2021 Nov. [facility] Technology Assessment, No. 25.62.) Appendix 3, Oswestry Disability Index category descriptors. Available  from: FindJewel[employer].cz  Minimally Clinically Important Difference (MCID) = 12.8%  COGNITION: Overall cognitive status: Within functional limits for tasks assessed     SENSATION: WFL  MUSCLE LENGTH: Hamstrings: tight bilaterally in sitting   POSTURE: rounded shoulders, forward head, decreased lumbar lordosis, and flexed trunk    LUMBAR ROM:   Not tested as increases pain due to ankylosis from lower thoracic to L3  LOWER EXTREMITY ROM:     wfl  LOWER EXTREMITY MMT:    MMT Right eval Left eval  Hip flexion 78.1 31.2  Hip extension    Hip abduction  25.1 30.1  Hip adduction    Hip internal rotation    Hip external rotation    Knee flexion    Knee extension    Ankle dorsiflexion    Ankle plantarflexion    Ankle inversion    Ankle eversion     (Blank rows = not tested)   FUNCTIONAL TESTS:  Timed up and go (TUG): 17.43  GAIT: Distance walked: 500 ft Assistive device utilized: None Level of assistance: Complete Independence Comments: Pt reports no limitation to amb  TREATMENT  [facility] Adult PT Treatment:                                                DATE: 02/06/24 Pt seen for aquatic therapy today.  Treatment took place in water 3.5-4.75 ft in depth at the [facility] pool. Temp of water was 91.  Pt entered/exited the pool via stairs using alternating patter with hand rail.  *walking forward, back and side stepping in 3.6 ft with unsupported *L stretch x 4 *thoracic rotation ue support yellow noodle->lumbar rotation *bow and arrow *Noodle wrapped posteriorly then anteriorly across chest: cycling; hip add/abd holding to wall: knees to chest from vertical with hold in KTC   Pt requires the buoyancy and hydrostatic pressure of water for support, and to offload joints by unweighting joint load by at least 50 % in navel deep water and by at least 75-80% in chest  to neck deep water.  Viscosity of the water is needed for resistance  of strengthening. Water current perturbations provides challenge to standing balance requiring increased core activation.                                                                                                                                  PATIENT EDUCATION:  Education details: Discussed eval findings, rehab rationale, aquatic program progression/POC and pools in area. Patient is in agreement  Person educated: Patient Education method: Explanation Education comprehension: verbalized understanding  HOME EXERCISE PROGRAM: Aquatic HEP This aquatic home exercise program from [employer] utilizes pictures from land based exercises, but has been adapted prior to lamination and issuance.    Access Code: [email or web address] URL: [email or web address] Date: 01/30/2024 Prepared by: Frankie Akbar Sacra **not issued  Exercises - Standing 'L' Stretch at [facility]  - 1 x daily - 1-3 x weekly - 1 sets - 3 reps - [place] and [place]   - 1 x daily - 1-3 x weekly - 1-2 sets - 10 reps - Standing Hip Hinge  - 1 x daily - 1-3 x weekly - 1-2 sets - 10 reps - Noodle press  - 1 x daily - 1-3 x weekly - 1-2 sets - 10 reps - Cycling on noodle/noodle wrapped under shoulders  - 1 x daily - 1-3 x weekly - Wall Push Up  - 1 x daily - 1-3 x weekly - 1-2 sets - 10 reps - Standing Hamstring Stretch on Chair  - 1 x daily - 1-3 x weekly - 1 sets - 3 reps - 15-20s hold - Gastroc Stretch on Step  - 1 x daily - 1-3 x weekly - 1 sets - 3 reps - 15-20s hold  ASSESSMENT:  CLINICAL IMPRESSION: PN: Pt with poor response to DN (completed at a different clinic). Pt with increased right sided LBP with raditation anteriorly. He tolerates aquatic session with difficulty due to frequent stabbing pain through right hip occurring with right hip flex particularly when completed from hip extension. Plan initially for this to be his final aquatic session. He has been progressing exceptionally well up to this point but is clearly in  a flair with some regression in function. He is planning on exercising in water going forward once he has final aquatic hep. He has met 2/6 STG. Will plan on extended pt 1w4 to progress him back to original baseline.  He will discuss with DN therapist whether her should move forward with continuing.         Initial Impression Patient is a 68 y.o. m who was seen today for physical therapy evaluation and treatment for LBP without radiculopathy. He is an active senior with recent left TKR.  He presents with pain limited deficits in low thoracic and lumbar spine affecting ROM, strength, activity tolerance, gait, balance, and functional mobility with ADL's. He scores in the moderate disability category in MOI. He  is a good candidate for aquatic therapy intervention and will benefit from the properties of water to progress toward goals.   OBJECTIVE IMPAIRMENTS: Abnormal gait, decreased activity tolerance, decreased balance, decreased endurance, decreased mobility, difficulty walking, decreased ROM, decreased strength, postural dysfunction, and pain.   ACTIVITY LIMITATIONS: carrying, lifting, bending, squatting, and transfers  PARTICIPATION LIMITATIONS: cleaning, community activity, and yard work  PERSONAL FACTORS: Time since onset of injury/illness/exacerbation are also affecting patient's functional outcome.   REHAB POTENTIAL: Good  CLINICAL DECISION MAKING: Evolving/moderate complexity  EVALUATION COMPLEXITY: Moderate   GOALS: Goals reviewed with patient? Yes  SHORT TERM GOALS->LTG: Target date: 03/08/24  Pt will tolerate full aquatic sessions consistently without increase in pain and with improving function to demonstrate good toleration and effectiveness of intervention.  Baseline: Goal status: Met 01/24/24  2.  Pt to improve on ODI by 13% (MCID)to demonstrate statistically significant Improvement in function. Baseline: 16/50=32% Goal status: INITIAL  3.  Pt will be indep with  final aquatic HEP for continued management of condition. Baseline:  Goal status: In progress 02/06/24  4.  Pt will decide if aquatic intervention to manage chronic condition warrants gaining pool access for future completion of aquatic HEP Baseline:  Goal status: Met 02/06/24  5.  Pt will improve strength in left hip flex by 5 lbs to demonstrate improved overall physical function  Baseline: see chart Goal status: INITIAL  6.  Pt will report decrease in pain by at least 25% for improved toleration to activity/quality of life and to demonstrate improved management of pain. Baseline:  Goal status: In progress 02/06/24  LONG TERM GOALS: to be assigned as approp at re-cert  PLAN:  PT FREQUENCY: 1x/week  PT DURATION: 4 weeks  PLANNED INTERVENTIONS: 97164- PT Re-evaluation, 97750- Physical Performance Testing, 97110-Therapeutic exercises, 97530- Therapeutic activity, 97112- Neuromuscular re-education, 97535- Self Care, 02859- Manual therapy, [phone]- Gait training, [phone]- Aquatic Therapy, [phone] (1-2 muscles), 20561 (3+ muscles)- Dry Needling, Patient/Family education, Balance training, Stair training, Taping, Joint mobilization, DME instructions, Cryotherapy, and Moist heat.  PLAN FOR NEXT SESSION: Aquatics: stretching and ROM of thoracic and lumbar spine; posture and body mechanic instruction. Hip strengthening (L significantly weaker >right).  HEP pt member here at [facility].  High co-pay for therapy.   Ronal Deloit) Zollie Clemence MPT 02/06/24 12:32 PM [facility] [address] [place], [place], [address] Phone: [phone]   Fax:  [phone]

## 2024-02-07 ENCOUNTER — Ambulatory Visit (INDEPENDENT_AMBULATORY_CARE_PROVIDER_SITE_OTHER)

## 2024-02-07 DIAGNOSIS — L501 Idiopathic urticaria: Secondary | ICD-10-CM

## 2024-02-15 ENCOUNTER — Encounter: Payer: Self-pay | Admitting: Neurosurgery

## 2024-02-15 ENCOUNTER — Encounter (HOSPITAL_BASED_OUTPATIENT_CLINIC_OR_DEPARTMENT_OTHER): Payer: Self-pay | Admitting: Physical Therapy

## 2024-02-15 ENCOUNTER — Ambulatory Visit (HOSPITAL_BASED_OUTPATIENT_CLINIC_OR_DEPARTMENT_OTHER): Payer: Self-pay | Admitting: Physical Therapy

## 2024-02-15 DIAGNOSIS — M6281 Muscle weakness (generalized): Secondary | ICD-10-CM

## 2024-02-15 DIAGNOSIS — M5459 Other low back pain: Secondary | ICD-10-CM | POA: Diagnosis not present

## 2024-02-15 DIAGNOSIS — R2689 Other abnormalities of gait and mobility: Secondary | ICD-10-CM

## 2024-02-15 DIAGNOSIS — M546 Pain in thoracic spine: Secondary | ICD-10-CM

## 2024-02-15 NOTE — Therapy (Signed)
 OUTPATIENT PHYSICAL THERAPY THORACOLUMBAR TREATMENT   Patient Name: Adam Glass MRN: [id number] DOB:02/17/1956, 68 y.o., male Today's Date: 02/15/2024  END OF SESSION:  PT End of Session - 02/15/24 0818     Visit Number 7    Number of Visits 10    Date for PT Re-Evaluation 03/08/24    Authorization Type [employer]    PT Start Time 0801    PT Stop Time 0840    PT Time Calculation (min) 39 min    Activity Tolerance Patient tolerated treatment well    Behavior During Therapy [facility] for tasks assessed/performed              Past Medical History:  Diagnosis Date   Acute diverticulitis    Allergy    seasonal, alpha-gal, shellfish   Arthritis    Atrial fibrillation (HCC)    Change in bowel habits    Constipation    Depression    Dysrhythmia    A fib   Hypertension    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   a.) surgical PCR (+) 03/28/2023 prior to LEFT TKR   OSA on CPAP    Prostate cancer (HCC)    Sleep apnea    uses CPAP   Spondylosis of lumbar region without myelopathy or radiculopathy    Tubular adenoma    Urticaria    Past Surgical History:  Procedure Laterality Date   COLONOSCOPY     COLONOSCOPY WITH PROPOFOL  N/A 10/20/2017   Procedure: COLONOSCOPY WITH PROPOFOL ;  Surgeon: Viktoria Lamar DASEN, MD;  Location: [facility] ENDOSCOPY;  Service: Endoscopy;  Laterality: N/A;   EYE SURGERY Left    Retina Tear, repaired with laser   KNEE ARTHROSCOPY Left    KNEE ARTHROSCOPY Left 11/09/2018   Procedure: ARTHROSCOPY KNEE LEFT;  Surgeon: Mardee Lynwood SQUIBB, MD;  Location: [facility] ORS;  Service: Orthopedics;  Laterality: Left;   ROTATOR CUFF REPAIR Left    TOTAL KNEE ARTHROPLASTY Left 04/03/2023   Procedure: TOTAL KNEE ARTHROPLASTY;  Surgeon: Lorelle Hussar, MD;  Location: [facility] ORS;  Service: Orthopedics;  Laterality: Left;   VASECTOMY     Patient Active Problem List   Diagnosis Date Noted   Stiffness of left knee 05/03/2023   S/P TKR (total knee replacement), left  04/03/2023   Acute diverticulitis 01/27/2023   Hypokalemia 01/27/2023   Medicare annual wellness visit, initial 06/29/2022   Spondylosis without myelopathy or radiculopathy, lumbar region 10/09/2019   Tubular adenoma 10/26/2017   Prostate cancer (HCC) 05/03/2017   Benign essential hypertension 12/11/2015   OSA on CPAP 12/11/2015   Hypertension 09/17/2014   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  Dr Burnetta is wanting me to try this thinking it may help.  Dr Bonner was doubtful.  I have a membership her have done some deep water classes for my knee.  Didn't pay attention to how my back felt.  Back pain is variable.  No pain at night when I sleep.  PERTINENT HISTORY:  progressive stiffness and pain in the hips and lumbar spine. 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 Fortunately the patient has no focal neurological deficits nor any signs or symptoms of radiculopathy or myelopathy. At this point there is no structural abnormality that would require surgical intervention. I do believe the stiffness and pain that he is having is due to the autofusion secondary to his DISH.   PAIN:  Are you having  pain? Yes: NPRS scale: normal pain 4-5/10; max 6-7/10 Pain location: LB r>L Pain description: ache throb, sharp Aggravating factors: bending Relieving factors: OTC meds; lying down,  heat  PRECAUTIONS: None  RED FLAGS: None   WEIGHT BEARING RESTRICTIONS: No  FALLS:  Has patient fallen in last 6 months? No  LIVING ENVIRONMENT: Lives with: lives with their spouse Lives in: House/apartment Stairs: Yes: Internal: 16 steps; on right going up Has following equipment at home: Single point cane and Walker - 2 wheeled  OCCUPATION: retired  PLOF: Independent  PATIENT GOALS: recognize how to use water for ex; play golf  NEXT MD VISIT: as needed  OBJECTIVE:  Note: Objective measures were completed at Evaluation unless otherwise noted.  DIAGNOSTIC FINDINGS:  MRI Lumbar spine IMPRESSION: 1. Chronic DISH with interbody ankylosis from the lower thoracic spine through to at least L3, possibly developing at L3-L4, and asymmetric right SI joint ankylosis noted on the August CT. No acute or suspicious osseous lesion. 2. New lower lumbar erector spinae muscle signal abnormality from L5 to the sacrum is new since 2022 but more resembles developing muscle atrophy than muscle edema. This might be related to #1. No paraspinal mass or lymphadenopathy. 3. L4-L5 and L5-S1 increased bilateral neural foraminal stenosis since 2022 is mild to moderate. No spinal or lateral recess stenosis.  PATIENT SURVEYS:  Modified Oswestry:  MODIFIED OSWESTRY DISABILITY SCALE  Date: 01/02/24 Score  Pain intensity 4 =  Pain medication provides me with little relief from pain.  2. Personal care (washing, dressing, etc.) 1 =  I can take care of myself normally, but it increases my pain.  3. Lifting 3 = Pain prevents me from lifting heavy weights, but I can manage (5) I have hardly any social life because of my pain. light to medium weights if they are conveniently positioned  4. Walking 1 = Pain prevents me  from walking more than 1 mile.  5. Sitting 1 =  I can only sit in my favorite chair as long as I like.  6. Standing 2 =  Pain prevents me from standing more than 1 hour  7. Sleeping 0 = Pain does not prevent me from sleeping well.  8. Social Life 2 = Pain prevents me from participating in more energetic activities (eg. sports, dancing).  9. Traveling 0 =  I can travel anywhere without increased pain.  10. Employment/ Homemaking 2 = I  can perform most of my homemaking/job duties, but pain prevents me from performing more physically stressful activities (eg, lifting, vacuuming).  Total 16/50=32%   Interpretation of scores: Score Category Description  0-20% Minimal Disability The patient can cope with most living activities. Usually no treatment is indicated apart from advice on lifting, sitting and exercise  21-40% Moderate Disability The patient experiences more pain and difficulty with sitting, lifting and standing. Travel and social life are more difficult and they may be disabled from work. Personal care, sexual activity and sleeping are not grossly affected, and the patient can usually be managed by conservative means  41-60% Severe Disability Pain remains the main problem in this group, but activities of daily living are affected. These patients require a detailed investigation  61-80% Crippled Back pain impinges on all aspects of the patient's life. Positive intervention is required  81-100% Bed-bound  These patients are either bed-bound or exaggerating their symptoms  Bluford FORBES Zoe DELENA Karon DELENA, et al. Surgery versus conservative management of stable thoracolumbar fracture: the PRESTO feasibility RCT. [place]): [employer]; 2021 Nov. [facility] Technology Assessment, No. 25.62.) Appendix 3, Oswestry Disability Index category descriptors. Available from: FindJewel[employer].cz  Minimally Clinically Important Difference (MCID) =  12.8%  COGNITION: Overall cognitive status: Within functional limits for tasks assessed     SENSATION: WFL  MUSCLE LENGTH: Hamstrings: tight bilaterally in sitting   POSTURE: rounded shoulders, forward head, decreased lumbar lordosis, and flexed trunk    LUMBAR ROM:   Not tested as increases pain due to ankylosis from lower thoracic to L3  LOWER EXTREMITY ROM:     wfl  LOWER EXTREMITY MMT:    MMT Right eval Left eval  Hip flexion 78.1 31.2  Hip extension    Hip abduction  25.1 30.1  Hip adduction    Hip internal rotation    Hip external rotation    Knee flexion    Knee extension    Ankle dorsiflexion    Ankle plantarflexion    Ankle inversion    Ankle eversion     (Blank rows = not tested)   FUNCTIONAL TESTS:  Timed up and go (TUG): 17.43  GAIT: Distance walked: 500 ft Assistive device utilized: None Level of assistance: Complete Independence Comments: Pt reports no limitation to amb  TREATMENT  [facility] Adult PT Treatment:                                                DATE: 02/15/24 Self care: use of Aleve; message MD for frequency and dose; DN response; pain associated with an inflammatory response   Pt seen for aquatic therapy today.  Treatment took place in water 3.5-4.75 ft in depth at the [facility] pool. Temp of water was 91.  Pt entered/exited the pool via stairs using alternating patter with hand rail.  *walking forward, back and side stepping in 3.6 ft with unsupported *L stretch x 4 *bow and arrow *wall push off x 10.  Demo for execution *Yellow HB carry for TrA engagement bilaterally then unilaterally *HS stretch at steps (tight)  *Noodle wrapped posteriorly then anteriorly across chest: cycling; hip add/abd holding to wall: knees to chest from vertical with hold in KTC   Pt requires the buoyancy and hydrostatic pressure of water for support, and to offload joints by unweighting joint load by at least  50 % in navel deep water and  by at least 75-80% in chest to neck deep water.  Viscosity of the water is needed for resistance of strengthening. Water current perturbations provides challenge to standing balance requiring increased core activation.                                                                                                                                  PATIENT EDUCATION:  Education details: Discussed eval findings, rehab rationale, aquatic program progression/POC and pools in area. Patient is in agreement  Person educated: Patient Education method: Explanation Education comprehension: verbalized understanding  HOME EXERCISE PROGRAM: Aquatic HEP This aquatic home exercise program from [employer] utilizes pictures from land based exercises, but has been adapted prior to lamination and issuance.    Access Code: [email or web address] URL: [email or web address] Date: 01/30/2024 Prepared by: Frankie Desia Saban **not issued  Exercises - Standing 'L' Stretch at [facility]  - 1 x daily - 1-3 x weekly - 1 sets - 3 reps - [place]   - 1 x daily - 1-3 x weekly - 1-2 sets - 10 reps - Standing Hip Hinge  - 1 x daily - 1-3 x weekly - 1-2 sets - 10 reps - Noodle press  - 1 x daily - 1-3 x weekly - 1-2 sets - 10 reps - Cycling on noodle/noodle wrapped under shoulders  - 1 x daily - 1-3 x weekly - Wall Push Up  - 1 x daily - 1-3 x weekly - 1-2 sets - 10 reps - Standing Hamstring Stretch on Chair  - 1 x daily - 1-3 x weekly - 1 sets - 3 reps - 15-20s hold - Gastroc Stretch on Step  - 1 x daily - 1-3 x weekly - 1 sets - 3 reps - 15-20s hold  ASSESSMENT:  CLINICAL IMPRESSION: Pt has decided not to have any more DN.  He continues to have residual pain but it has decreased (4/10) with almost complete reduction once submerged. He demonstrates improved toleration to session and was able to add back a few exercises from prior to DN without adverse response. HEP issued but not instructed.  Pt to return in one week  and will try to go to pool in between. Goals ongoing   PN: Pt with poor response to DN (completed at a different clinic). Pt with increased right sided LBP with raditation anteriorly. He tolerates aquatic session with difficulty due to frequent stabbing pain through right hip occurring with right hip flex particularly when completed from hip extension. Plan initially for this to be his final aquatic session. He has been progressing exceptionally well up to this point but is clearly in a flair with some regression in function. He is planning on exercising in water going forward once he has final aquatic hep. He has met 2/6 STG. Will plan on extended pt 1w4 to progress him back to original baseline.  He will discuss with DN therapist whether her should move forward with continuing.         Initial Impression Patient is a 68 y.o. m who was seen today for physical therapy evaluation and treatment for LBP without radiculopathy. He is an active senior with recent left TKR.  He presents with pain limited deficits in low thoracic and lumbar spine affecting ROM, strength, activity tolerance, gait, balance, and functional mobility with ADL's. He scores in the moderate disability category in MOI. He is a good candidate for aquatic therapy intervention and will benefit from the properties of water to progress toward goals.   OBJECTIVE IMPAIRMENTS: Abnormal gait, decreased activity tolerance, decreased balance, decreased endurance, decreased mobility, difficulty walking, decreased ROM, decreased strength, postural dysfunction, and pain.   ACTIVITY LIMITATIONS: carrying, lifting, bending, squatting, and transfers  PARTICIPATION LIMITATIONS: cleaning, community activity, and yard work  PERSONAL FACTORS: Time since onset of injury/illness/exacerbation are also affecting patient's functional outcome.   REHAB POTENTIAL: Good  CLINICAL DECISION MAKING: Evolving/moderate complexity  EVALUATION COMPLEXITY:  Moderate   GOALS: Goals reviewed with patient? Yes  SHORT TERM GOALS->LTG: Target date: 03/08/24  Pt will tolerate full aquatic sessions consistently without increase in pain and with improving function to demonstrate good toleration and effectiveness of intervention.  Baseline: Goal status: Met 01/24/24  2.  Pt to improve on ODI by 13% (MCID)to demonstrate statistically significant Improvement in function. Baseline: 16/50=32% Goal status: INITIAL  3.  Pt will be indep with final aquatic HEP for continued management of condition. Baseline:  Goal status: In progress 02/06/24  4.  Pt will decide if aquatic intervention to manage chronic condition warrants gaining pool access for future completion of aquatic HEP Baseline:  Goal status: Met 02/06/24  5.  Pt will improve strength in left hip flex by 5 lbs to demonstrate improved overall physical function  Baseline: see chart Goal status: INITIAL  6.  Pt will report decrease in pain by at least 25% for improved toleration to activity/quality of life and to demonstrate improved management of pain. Baseline:  Goal status: In progress 02/06/24  LONG TERM GOALS: to be assigned as approp at re-cert  PLAN:  PT FREQUENCY: 1x/week  PT DURATION: 4 weeks  PLANNED INTERVENTIONS: 97164- PT Re-evaluation, 97750- Physical Performance Testing, 97110-Therapeutic exercises, 97530- Therapeutic activity, 97112- Neuromuscular re-education, 97535- Self Care, [id number]- Manual therapy, [phone]- Gait training, [phone]- Aquatic Therapy, [phone] (1-2 muscles), [id number] (3+ muscles)- Dry Needling, Patient/Family education, Balance training, Stair training, Taping, Joint mobilization, DME instructions, Cryotherapy, and Moist heat.  PLAN FOR NEXT SESSION: Aquatics: stretching and ROM of thoracic and lumbar spine; posture and body mechanic instruction. Hip strengthening (L significantly weaker >right).  HEP pt member here at [facility].  High co-pay for therapy.   [place])  Timmya Blazier MPT 02/15/24 8:19 AM [facility] [address] [place], [place], [address] Phone: [phone]   Fax:  [phone]

## 2024-02-19 ENCOUNTER — Other Ambulatory Visit: Payer: Self-pay

## 2024-02-19 ENCOUNTER — Other Ambulatory Visit: Payer: Self-pay | Admitting: Student

## 2024-02-19 DIAGNOSIS — M47816 Spondylosis without myelopathy or radiculopathy, lumbar region: Secondary | ICD-10-CM

## 2024-02-19 DIAGNOSIS — M47814 Spondylosis without myelopathy or radiculopathy, thoracic region: Secondary | ICD-10-CM

## 2024-02-22 ENCOUNTER — Encounter (HOSPITAL_BASED_OUTPATIENT_CLINIC_OR_DEPARTMENT_OTHER): Payer: Self-pay | Admitting: Physical Therapy

## 2024-02-22 ENCOUNTER — Ambulatory Visit (HOSPITAL_BASED_OUTPATIENT_CLINIC_OR_DEPARTMENT_OTHER): Payer: Self-pay | Admitting: Physical Therapy

## 2024-02-22 DIAGNOSIS — M5459 Other low back pain: Secondary | ICD-10-CM

## 2024-02-22 DIAGNOSIS — R2689 Other abnormalities of gait and mobility: Secondary | ICD-10-CM

## 2024-02-22 DIAGNOSIS — M6281 Muscle weakness (generalized): Secondary | ICD-10-CM

## 2024-02-22 NOTE — Therapy (Signed)
 OUTPATIENT PHYSICAL THERAPY THORACOLUMBAR TREATMENT   Patient Name: Adam Glass MRN: [id number] DOB:06-09-1955, 68 y.o., male Today's Date: 02/22/2024  END OF SESSION:  PT End of Session - 02/22/24 1026     Visit Number 8    Number of Visits 10    Date for Recertification  03/08/24    Authorization Type [employer]    PT Start Time 1018    PT Stop Time 1058    PT Time Calculation (min) 40 min    Activity Tolerance Patient tolerated treatment well    Behavior During Therapy [facility]  for tasks assessed/performed              Past Medical History:  Diagnosis Date   Acute diverticulitis    Allergy    seasonal, alpha-gal, shellfish   Arthritis    Atrial fibrillation (HCC)    Change in bowel habits    Constipation    Depression    Dysrhythmia    A fib   Hypertension    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   a.) surgical PCR (+) 03/28/2023 prior to LEFT TKR   OSA on CPAP    Prostate cancer (HCC)    Sleep apnea    uses CPAP   Spondylosis of lumbar region without myelopathy or radiculopathy    Tubular adenoma    Urticaria    Past Surgical History:  Procedure Laterality Date   COLONOSCOPY     COLONOSCOPY WITH PROPOFOL  N/A 10/20/2017   Procedure: COLONOSCOPY WITH PROPOFOL ;  Surgeon: Viktoria Lamar DASEN, MD;  Location: [facility] ENDOSCOPY;  Service: Endoscopy;  Laterality: N/A;   EYE SURGERY Left    Retina Tear, repaired with laser   KNEE ARTHROSCOPY Left    KNEE ARTHROSCOPY Left 11/09/2018   Procedure: ARTHROSCOPY KNEE LEFT;  Surgeon: Mardee Lynwood SQUIBB, MD;  Location: [facility] ORS;  Service: Orthopedics;  Laterality: Left;   ROTATOR CUFF REPAIR Left    TOTAL KNEE ARTHROPLASTY Left 04/03/2023   Procedure: TOTAL KNEE ARTHROPLASTY;  Surgeon: Lorelle Hussar, MD;  Location: [facility] ORS;  Service: Orthopedics;  Laterality: Left;   VASECTOMY     Patient Active Problem List   Diagnosis Date Noted   Stiffness of left knee 05/03/2023   S/P TKR (total knee replacement), left  04/03/2023   Acute diverticulitis 01/27/2023   Hypokalemia 01/27/2023   Medicare annual wellness visit, initial 06/29/2022   Spondylosis without myelopathy or radiculopathy, lumbar region 10/09/2019   Tubular adenoma 10/26/2017   Prostate cancer (HCC) 05/03/2017   Benign essential hypertension 12/11/2015   OSA on CPAP 12/11/2015   Hypertension 09/17/2014   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  Dr Burnetta is wanting me to try this thinking it may help.  Dr Bonner was doubtful.  I have a membership her have done some deep water classes for my knee.  Didn't pay attention to how my back felt.  Back pain is variable.  No pain at night when I sleep.  PERTINENT HISTORY:  progressive stiffness and pain in the hips and lumbar spine. 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 Fortunately the patient has no focal neurological deficits nor any signs or symptoms of radiculopathy or myelopathy. At this point there is no structural abnormality that would require surgical intervention. I do believe the stiffness and pain that he is having is due to the autofusion  secondary to his DISH.   PAIN:  Are you having pain? Yes: NPRS scale: normal pain 4-5/10; max 6-7/10 Pain location: LB r>L Pain description: ache throb, sharp Aggravating factors: bending Relieving factors: OTC meds; lying down,  heat  PRECAUTIONS: None  RED FLAGS: None   WEIGHT BEARING RESTRICTIONS: No  FALLS:  Has patient fallen in last 6 months? No  LIVING ENVIRONMENT: Lives with: lives with their spouse Lives in: House/apartment Stairs: Yes: Internal: 16 steps; on right going up Has following equipment at home: Single point cane and Walker - 2 wheeled  OCCUPATION: retired  PLOF: Independent  PATIENT GOALS: recognize how to use water for ex; play golf  NEXT MD VISIT: as needed  OBJECTIVE:  Note: Objective measures were completed at Evaluation unless otherwise noted.  DIAGNOSTIC FINDINGS:  MRI Lumbar spine IMPRESSION: 1. Chronic DISH with interbody ankylosis from the lower thoracic spine through to at least L3, possibly developing at L3-L4, and asymmetric right SI joint ankylosis noted on the August CT. No acute or suspicious osseous lesion. 2. New lower lumbar erector spinae muscle signal abnormality from L5 to the sacrum is new since 2022 but more resembles developing muscle atrophy than muscle edema. This might be related to #1. No paraspinal mass or lymphadenopathy. 3. L4-L5 and L5-S1 increased bilateral neural foraminal stenosis since 2022 is mild to moderate. No spinal or lateral recess stenosis.  PATIENT SURVEYS:  Modified Oswestry:  MODIFIED OSWESTRY DISABILITY SCALE  Date: 01/02/24 Score  Pain intensity 4 =  Pain medication provides me with little relief from pain.  2. Personal care (washing, dressing, etc.) 1 =  I can take care of myself normally, but it increases my pain.  3. Lifting 3 = Pain prevents me from lifting heavy weights, but I can manage (5) I have hardly any social life because of my pain. light to medium weights if they are  conveniently positioned  4. Walking 1 = Pain prevents me from walking more than 1 mile.  5. Sitting 1 =  I can only sit in my favorite chair as long as I like.  6. Standing 2 =  Pain prevents me from standing more than 1 hour  7. Sleeping 0 = Pain does not prevent me from sleeping well.  8. Social Life 2 = Pain prevents me from participating in more energetic activities (eg. sports, dancing).  9. Traveling 0 =  I can travel anywhere without increased  pain.  10. Employment/ Homemaking 2 = I can perform most of my homemaking/job duties, but pain prevents me from performing more physically stressful activities (eg, lifting, vacuuming).  Total 16/50=32%   Interpretation of scores: Score Category Description  0-20% Minimal Disability The patient can cope with most living activities. Usually no treatment is indicated apart from advice on lifting, sitting and exercise  21-40% Moderate Disability The patient experiences more pain and difficulty with sitting, lifting and standing. Travel and social life are more difficult and they may be disabled from work. Personal care, sexual activity and sleeping are not grossly affected, and the patient can usually be managed by conservative means  41-60% Severe Disability Pain remains the main problem in this group, but activities of daily living are affected. These patients require a detailed investigation  61-80% Crippled Back pain impinges on all aspects of the patient's life. Positive intervention is required  81-100% Bed-bound  These patients are either bed-bound or exaggerating their symptoms  Bluford FORBES Zoe DELENA Karon DELENA, et al. Surgery versus conservative management of stable thoracolumbar fracture: the PRESTO feasibility RCT. [place] ([place]): [employer]; 2021 Nov. [facility] Technology Assessment, No. 25.62.) Appendix 3, Oswestry Disability Index category descriptors. Available from: FindJewel[employer].cz  Minimally  Clinically Important Difference (MCID) = 12.8%  COGNITION: Overall cognitive status: Within functional limits for tasks assessed     SENSATION: WFL  MUSCLE LENGTH: Hamstrings: tight bilaterally in sitting   POSTURE: rounded shoulders, forward head, decreased lumbar lordosis, and flexed trunk    LUMBAR ROM:   Not tested as increases pain due to ankylosis from lower thoracic to L3  LOWER EXTREMITY ROM:     wfl  LOWER EXTREMITY MMT:    MMT Right eval Left eval  Hip flexion 78.1 31.2  Hip extension    Hip abduction  25.1 30.1  Hip adduction    Hip internal rotation    Hip external rotation    Knee flexion    Knee extension    Ankle dorsiflexion    Ankle plantarflexion    Ankle inversion    Ankle eversion     (Blank rows = not tested)   FUNCTIONAL TESTS:  Timed up and go (TUG): 17.43  GAIT: Distance walked: 500 ft Assistive device utilized: None Level of assistance: Complete Independence Comments: Pt reports no limitation to amb  TREATMENT  [facility] Adult PT Treatment:                                                DATE: 02/15/24 Self care: use of Aleve; message MD for frequency and dose; DN response; pain associated with an inflammatory response   Pt seen for aquatic therapy today.  Treatment took place in water 3.5-4.75 ft in depth at the [facility] pool. Temp of water was 91.  Pt entered/exited the pool via stairs using alternating patter with hand rail.  *walking forward, back and side stepping in 3.6 ft with unsupported *L stretch x 4 *bow and arrow *wall push off x 10.  Cues for abd bracing *Yellow ->blue HB carry for TrA engagement bilaterally then unilaterally *HS/gastroc stretch using black noodle *hip hinging 4.6 ft verbal and tactile cues for execution x 5. VC and Tc for execution  *Yellow noodle press for TrA engagement x 10 wide stance then staggered x 5 *Noodle wrapped posteriorly then anteriorly  across chest: cycling; hip add/abd  holding to wall: knees to chest from vertical with hold in KTC   Pt requires the buoyancy and hydrostatic pressure of water for support, and to offload joints by unweighting joint load by at least 50 % in navel deep water and by at least 75-80% in chest to neck deep water.  Viscosity of the water is needed for resistance of strengthening. Water current perturbations provides challenge to standing balance requiring increased core activation.                                                                                                                                  PATIENT EDUCATION:  Education details: Discussed eval findings, rehab rationale, aquatic program progression/POC and pools in area. Patient is in agreement  Person educated: Patient Education method: Explanation Education comprehension: verbalized understanding  HOME EXERCISE PROGRAM: Aquatic HEP This aquatic home exercise program from [employer] utilizes pictures from land based exercises, but has been adapted prior to lamination and issuance.    Access Code: [email or web address] URL: [email or web address] Date: 01/30/2024 Prepared by: Frankie Kewanna Kasprzak **not issued  Exercises - Standing 'L' Stretch at [facility]  - 1 x daily - 1-3 x weekly - 1 sets - 3 reps - Bow and Arrow   - 1 x daily - 1-3 x weekly - 1-2 sets - 10 reps - Standing Hip Hinge  - 1 x daily - 1-3 x weekly - 1-2 sets - 10 reps - Noodle press  - 1 x daily - 1-3 x weekly - 1-2 sets - 10 reps - Cycling on noodle/noodle wrapped under shoulders  - 1 x daily - 1-3 x weekly - Wall Push Up  - 1 x daily - 1-3 x weekly - 1-2 sets - 10 reps - Standing Hamstring Stretch on Chair  - 1 x daily - 1-3 x weekly - 1 sets - 3 reps - 15-20s hold - Gastroc Stretch on Step  - 1 x daily - 1-3 x weekly - 1 sets - 3 reps - 15-20s hold  ASSESSMENT:  CLINICAL IMPRESSION: Pt feels he is just about returned to pre-DN pain level.  Not having any sharp pains/ms spasms although pain  seems to be a constant ache.  Has appt for CAT scan. Pt was able to tolerate core exercises today returning from eliminating after increase in pain due to DN. He will return next session with Hep issued last for instruction/clarifications preparing for DC.     PN: Pt with poor response to DN (completed at a different clinic). Pt with increased right sided LBP with raditation anteriorly. He tolerates aquatic session with difficulty due to frequent stabbing pain through right hip occurring with right hip flex particularly when completed from hip extension. Plan initially for this to be his final aquatic session. He has been progressing exceptionally well up to this point but is clearly in a flair with some regression in  function. He is planning on exercising in water going forward once he has final aquatic hep. He has met 2/6 STG. Will plan on extended pt 1w4 to progress him back to original baseline.  He will discuss with DN therapist whether her should move forward with continuing.         Initial Impression Patient is a 68 y.o. m who was seen today for physical therapy evaluation and treatment for LBP without radiculopathy. He is an active senior with recent left TKR.  He presents with pain limited deficits in low thoracic and lumbar spine affecting ROM, strength, activity tolerance, gait, balance, and functional mobility with ADL's. He scores in the moderate disability category in MOI. He is a good candidate for aquatic therapy intervention and will benefit from the properties of water to progress toward goals.   OBJECTIVE IMPAIRMENTS: Abnormal gait, decreased activity tolerance, decreased balance, decreased endurance, decreased mobility, difficulty walking, decreased ROM, decreased strength, postural dysfunction, and pain.   ACTIVITY LIMITATIONS: carrying, lifting, bending, squatting, and transfers  PARTICIPATION LIMITATIONS: cleaning, community activity, and yard work  PERSONAL FACTORS:  Time since onset of injury/illness/exacerbation are also affecting patient's functional outcome.   REHAB POTENTIAL: Good  CLINICAL DECISION MAKING: Evolving/moderate complexity  EVALUATION COMPLEXITY: Moderate   GOALS: Goals reviewed with patient? Yes  SHORT TERM GOALS->LTG: Target date: 03/08/24  Pt will tolerate full aquatic sessions consistently without increase in pain and with improving function to demonstrate good toleration and effectiveness of intervention.  Baseline: Goal status: Met 01/24/24  2.  Pt to improve on ODI by 13% (MCID)to demonstrate statistically significant Improvement in function. Baseline: 16/50=32% Goal status: INITIAL  3.  Pt will be indep with final aquatic HEP for continued management of condition. Baseline:  Goal status: In progress 02/06/24  4.  Pt will decide if aquatic intervention to manage chronic condition warrants gaining pool access for future completion of aquatic HEP Baseline:  Goal status: Met 02/06/24  5.  Pt will improve strength in left hip flex by 5 lbs to demonstrate improved overall physical function  Baseline: see chart Goal status: INITIAL  6.  Pt will report decrease in pain by at least 25% for improved toleration to activity/quality of life and to demonstrate improved management of pain. Baseline:  Goal status: In progress 02/06/24  LONG TERM GOALS: to be assigned as approp at re-cert  PLAN:  PT FREQUENCY: 1x/week  PT DURATION: 4 weeks  PLANNED INTERVENTIONS: 97164- PT Re-evaluation, 97750- Physical Performance Testing, 97110-Therapeutic exercises, 97530- Therapeutic activity, 97112- Neuromuscular re-education, 97535- Self Care, 02859- Manual therapy, [phone]- Gait training, [phone]- Aquatic Therapy, [phone] (1-2 muscles), [id number] (3+ muscles)- Dry Needling, Patient/Family education, Balance training, Stair training, Taping, Joint mobilization, DME instructions, Cryotherapy, and Moist heat.  PLAN FOR NEXT SESSION: Aquatics: stretching  and ROM of thoracic and lumbar spine; posture and body mechanic instruction. Hip strengthening (L significantly weaker >right).  HEP pt member here at [facility].  High co-pay for therapy.   [address] [place]) Maurice Fotheringham MPT 02/22/24 10:28 AM [facility] [address] [place], [place], [address] Phone: [phone]   Fax:  [phone]

## 2024-02-28 ENCOUNTER — Encounter (HOSPITAL_BASED_OUTPATIENT_CLINIC_OR_DEPARTMENT_OTHER): Payer: Self-pay | Admitting: Physical Therapy

## 2024-02-28 ENCOUNTER — Ambulatory Visit (HOSPITAL_BASED_OUTPATIENT_CLINIC_OR_DEPARTMENT_OTHER): Payer: Self-pay | Admitting: Physical Therapy

## 2024-02-28 DIAGNOSIS — M5459 Other low back pain: Secondary | ICD-10-CM | POA: Diagnosis not present

## 2024-02-28 DIAGNOSIS — R2689 Other abnormalities of gait and mobility: Secondary | ICD-10-CM

## 2024-02-28 DIAGNOSIS — M6281 Muscle weakness (generalized): Secondary | ICD-10-CM

## 2024-02-28 NOTE — Therapy (Signed)
 OUTPATIENT PHYSICAL THERAPY THORACOLUMBAR TREATMENT PHYSICAL THERAPY DISCHARGE SUMMARY  Visits from Start of Care: 9  Current functional level related to goals / functional outcomes: indep   Remaining deficits: Chronic condition   Education / Equipment: Management of condition/HEP   Patient agrees to discharge. Patient goals were partially met. Patient is being discharged due to maximized rehab potential.    Patient Name: Adam Glass MRN: [id number] DOB:11/13/1955, 68 y.o., male Today's Date: 02/28/2024  END OF SESSION:  PT End of Session - 02/28/24 1620     Visit Number 9    Number of Visits 10    Date for Recertification  03/08/24    Authorization Type [employer]    PT Start Time 1615    PT Stop Time 1655    PT Time Calculation (min) 40 min    Activity Tolerance Patient tolerated treatment well    Behavior During Therapy [facility] for tasks assessed/performed              Past Medical History:  Diagnosis Date   Acute diverticulitis    Allergy    seasonal, alpha-gal, shellfish   Arthritis    Atrial fibrillation (HCC)    Change in bowel habits    Constipation    Depression    Dysrhythmia    A fib   Hypertension    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   a.) surgical PCR (+) 03/28/2023 prior to LEFT TKR   OSA on CPAP    Prostate cancer (HCC)    Sleep apnea    uses CPAP   Spondylosis of lumbar region without myelopathy or radiculopathy    Tubular adenoma    Urticaria    Past Surgical History:  Procedure Laterality Date   COLONOSCOPY     COLONOSCOPY WITH PROPOFOL  N/A 10/20/2017   Procedure: COLONOSCOPY WITH PROPOFOL ;  Surgeon: Viktoria Lamar DASEN, MD;  Location: [facility] ENDOSCOPY;  Service: Endoscopy;  Laterality: N/A;   EYE SURGERY Left    Retina Tear, repaired with laser   KNEE ARTHROSCOPY Left    KNEE ARTHROSCOPY Left 11/09/2018   Procedure: ARTHROSCOPY KNEE LEFT;  Surgeon: Mardee Lynwood SQUIBB, MD;  Location: [facility] ORS;  Service: Orthopedics;   Laterality: Left;   ROTATOR CUFF REPAIR Left    TOTAL KNEE ARTHROPLASTY Left 04/03/2023   Procedure: TOTAL KNEE ARTHROPLASTY;  Surgeon: Lorelle Hussar, MD;  Location: [facility] ORS;  Service: Orthopedics;  Laterality: Left;   VASECTOMY     Patient Active Problem List   Diagnosis Date Noted   Stiffness of left knee 05/03/2023   S/P TKR (total knee replacement), left 04/03/2023   Acute diverticulitis 01/27/2023   Hypokalemia 01/27/2023   Medicare annual wellness visit, initial 06/29/2022   Spondylosis without myelopathy or radiculopathy, lumbar region 10/09/2019   Tubular adenoma 10/26/2017   Prostate cancer (HCC) 05/03/2017   Benign essential hypertension 12/11/2015   OSA on CPAP 12/11/2015   Hypertension 09/17/2014   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  Pain 1/10. Am using NeuroMD, got it on line is it like a TENS unit but stronger.  Has helped a lot   Initial subjective Have a lot going on just had my knee done and have bursitis in my right hip that is better.  Dr Burnetta is wanting me to try this thinking it may help.  Dr Bonner was doubtful.  I have a membership her have done some deep water classes for my knee.  Didn't pay attention to how my back felt.  Back pain is variable.  No pain at night when I sleep.  PERTINENT HISTORY:  progressive stiffness and pain in the hips and lumbar spine. 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 Fortunately the patient has no focal neurological deficits nor any signs or symptoms of radiculopathy or myelopathy. At this point there is no structural abnormality that would require surgical intervention. I do believe the stiffness and pain that he is having is due to the autofusion secondary to his DISH.   PAIN:  Are you having pain? Yes: NPRS scale: normal pain 4-5/10; max 6-7/10 Pain location: LB r>L Pain description: ache throb, sharp Aggravating factors: bending Relieving factors: OTC meds; lying down,  heat  PRECAUTIONS: None  RED FLAGS: None   WEIGHT BEARING RESTRICTIONS: No  FALLS:  Has patient fallen in last 6 months? No  LIVING ENVIRONMENT: Lives with: lives with their spouse Lives in: House/apartment Stairs: Yes: Internal: 16 steps; on right going up Has following equipment at home: Single point cane and Walker - 2 wheeled  OCCUPATION: retired  PLOF: Independent  PATIENT GOALS: recognize how to use water for ex; play golf  NEXT MD VISIT: as needed  OBJECTIVE:  Note: Objective measures were completed at Evaluation unless otherwise noted.  DIAGNOSTIC FINDINGS:  MRI Lumbar spine IMPRESSION: 1. Chronic DISH with interbody ankylosis from the lower thoracic spine through to at least L3, possibly developing at L3-L4, and asymmetric right SI joint ankylosis noted on the August CT. No acute or suspicious osseous lesion. 2. New lower lumbar erector spinae muscle signal abnormality from L5 to the sacrum is new since 2022 but more resembles developing muscle atrophy than muscle edema. This might be related to #1. No paraspinal mass or lymphadenopathy. 3. L4-L5 and L5-S1 increased bilateral neural foraminal stenosis since 2022 is mild to moderate. No spinal or lateral recess stenosis.  PATIENT SURVEYS:  Modified Oswestry:  MODIFIED OSWESTRY DISABILITY SCALE  Date: 01/02/24 Score   Pain intensity 4 =  Pain medication provides me with little relief from pain.  2. Personal care (washing, dressing, etc.) 1 =  I can take care of myself normally, but it increases my pain.  3. Lifting 3 = Pain prevents me from lifting heavy weights, but I can manage (5) I have hardly any social life because of my pain. light to medium weights if they are conveniently positioned  4. Walking 1 = Pain prevents me from walking more than 1 mile.  5. Sitting 1 =  I can only sit in my favorite chair as long as I like.  6. Standing 2 =  Pain prevents me from standing more than 1 hour  7. Sleeping 0 = Pain does not prevent me from sleeping well.  8. Social Life 2 = Pain prevents me from participating in more energetic activities (eg. sports, dancing).  9. Traveling 0 =  I can travel  anywhere without increased pain.  10. Employment/ Homemaking 2 = I can perform most of my homemaking/job duties, but pain prevents me from performing more physically stressful activities (eg, lifting, vacuuming).  Total 16/50=32%   Interpretation of scores: Score Category Description  0-20% Minimal Disability The patient can cope with most living activities. Usually no treatment is indicated apart from advice on lifting, sitting and exercise  21-40% Moderate Disability The patient experiences more pain and difficulty with sitting, lifting and standing. Travel and social life are more difficult and they may be disabled from work. Personal care, sexual activity and sleeping are not grossly affected, and the patient can usually be managed by conservative means  41-60% Severe Disability Pain remains the main problem in this group, but activities of daily living are affected. These patients require a detailed investigation  61-80% Crippled Back pain impinges on all aspects of the patient's life. Positive intervention is required  81-100% Bed-bound  These patients are either bed-bound or exaggerating their symptoms  Bluford FORBES Zoe DELENA Karon DELENA, et al. Surgery versus conservative management of stable thoracolumbar fracture: the PRESTO feasibility RCT. [place] ([place]): [employer]; 2021 Nov. [facility] Technology Assessment, No. 25.62.) Appendix 3, Oswestry Disability Index category descriptors. Available from: FindJewel[employer].cz  Minimally Clinically Important Difference (MCID) = 12.8%  COGNITION: Overall cognitive status: Within functional limits for tasks assessed     SENSATION: WFL  MUSCLE LENGTH: Hamstrings: tight bilaterally in sitting   POSTURE: rounded shoulders, forward head, decreased lumbar lordosis, and flexed trunk    LUMBAR ROM:   Not tested as increases pain due to ankylosis from lower thoracic to L3  LOWER EXTREMITY ROM:     wfl  LOWER EXTREMITY MMT:    MMT Right eval Left eval  L 02/28/24  Hip flexion 78.1 31.2 29.6  Hip extension     Hip abduction  25.1 30.1 25.8 / 34/9  Hip adduction     Hip internal rotation     Hip external rotation     Knee flexion     Knee extension     Ankle dorsiflexion     Ankle plantarflexion     Ankle inversion     Ankle eversion      (Blank rows = not tested)   FUNCTIONAL TESTS:  Timed up and go (TUG): 17.43  GAIT: Distance walked: 500 ft Assistive device utilized: None Level of assistance: Complete Independence Comments: Pt reports no limitation to amb  TREATMENT  [facility] Adult PT Treatment:                                                DATE: 02/28/24 Pt seen for aquatic therapy today.  Treatment took place in water 3.5-4.75 ft in depth at the [facility] pool. Temp of water was 91.  Pt entered/exited the pool via stairs using alternating patter with hand rail.   Standing 'L' Stretch at [employer]  - [place]  - Standing Hip Hinge   - Noodle press  - Cycling on noodle/noodle wrapped under shoulders   - Wall Push Up - Standing Hamstring Stretch on Chair  - Gastroc Stretch on Step     - cycling on noodle  Pt requires the buoyancy and hydrostatic pressure of water for support, and to offload joints by unweighting joint load by at least 50 %  in navel deep water and by at least 75-80% in chest to neck deep water.  Viscosity of the water is needed for resistance of strengthening. Water current perturbations provides challenge to standing balance requiring increased core activation.                                                                                                                                  PATIENT EDUCATION:  Education details: Discussed eval findings, rehab rationale, aquatic program progression/POC and pools in area. Patient is in agreement  Person educated: Patient Education method: Explanation Education comprehension: verbalized understanding  HOME EXERCISE PROGRAM: Aquatic HEP This aquatic home exercise program from [employer] utilizes pictures from land based exercises, but has been adapted prior to lamination and issuance.    Access Code: [email or web address] URL: [email or web address] Date: 01/30/2024 Prepared by: Frankie Jovonne Wilton **not issued  Exercises - Standing 'L' Stretch at [facility]  - 1 x daily - 1-3 x weekly - 1 sets - 3 reps - Bow and Arrow   - 1 x daily - 1-3 x weekly - 1-2 sets - 10 reps - Standing Hip Hinge  - 1 x daily - 1-3 x weekly - 1-2 sets - 10 reps - Noodle press  - 1 x daily - 1-3 x weekly - 1-2 sets - 10 reps - Cycling on noodle/noodle wrapped under shoulders  - 1 x daily - 1-3 x weekly - Wall Push Up  - 1 x daily - 1-3 x weekly - 1-2 sets - 10 reps - Standing Hamstring Stretch on Chair  - 1 x daily - 1-3 x weekly - 1 sets - 3 reps - 15-20s hold - Gastroc Stretch on Step  - 1 x daily - 1-3 x weekly - 1 sets - 3 reps - 15-20s hold  ASSESSMENT:  CLINICAL IMPRESSION: Pt reports pain is improved since addition of home electric stim unit he is using 1 x day for 25 minutes. He brings final aquatic HEP with him today  and is directed through it requiring some VC for execution and instruction on variations to increase or decrease challenge. Written clarifications added to program. He demonstrates good understanding and indep. Pt has reached his max potential and met most goals. DC'ed   OBJECTIVE IMPAIRMENTS: Abnormal gait, decreased activity tolerance, decreased balance, decreased endurance, decreased mobility, difficulty walking, decreased ROM, decreased strength, postural dysfunction, and pain.   ACTIVITY LIMITATIONS: carrying, lifting, bending, squatting, and transfers  PARTICIPATION LIMITATIONS: cleaning, community activity, and yard work  PERSONAL FACTORS: Time since onset of injury/illness/exacerbation are also affecting patient's functional outcome.   REHAB POTENTIAL: Good  CLINICAL DECISION MAKING: Evolving/moderate complexity  EVALUATION COMPLEXITY: Moderate   GOALS: Goals reviewed with patient? Yes  SHORT TERM GOALS->LTG: Target date: 03/08/24  Pt will tolerate full aquatic sessions consistently without increase in pain and with improving function to demonstrate good toleration and effectiveness of intervention.  Baseline: Goal status: Met 01/24/24  2.  Pt to improve on ODI by 13% (MCID)to demonstrate statistically significant Improvement in function. Baseline: 16/50=32%;  11/50=22% Goal status: progressed but not met 02/28/24  3.  Pt will be indep with final aquatic HEP for continued management of condition. Baseline:  Goal status: In progress 02/06/24; Met 02/28/24  4.  Pt will decide if aquatic intervention to manage chronic condition warrants gaining pool access for future completion of aquatic HEP Baseline:  Goal status: Met 02/06/24  5.  Pt will improve strength in left hip flex by 5 lbs to demonstrate improved overall physical function  Baseline: see chart Goal status: Not Met. (Pt requiring a loaded setting to build strength). 02/28/24  6.  Pt will report decrease in pain by at  least 25% for improved toleration to activity/quality of life and to demonstrate improved management of pain. Baseline:  Goal status: In progress 02/06/24; Met 02/28/24  LONG TERM GOALS: to be assigned as approp at re-cert  PLAN:  PT FREQUENCY: 1x/week  PT DURATION: 4 weeks  PLANNED INTERVENTIONS: 02835- PT Re-evaluation, 97750- Physical Performance Testing, 97110-Therapeutic exercises, 97530- Therapeutic activity, 97112- Neuromuscular re-education, 97535- Self Care, 02859- Manual therapy, [id number]- Gait training, [phone]- Aquatic Therapy, [phone] (1-2 muscles), [id number] (3+ muscles)- Dry Needling, Patient/Family education, Balance training, Stair training, Taping, Joint mobilization, DME instructions, Cryotherapy, and Moist heat.  PLAN FOR NEXT SESSION: Aquatics: stretching and ROM of thoracic and lumbar spine; posture and body mechanic instruction. Hip strengthening (L significantly weaker >right).  HEP pt member here at [facility].  High co-pay for therapy.   [address] [place]) Jahdiel Krol MPT 02/28/24 4:21 PM [facility] [address] [place], [place], [address] Phone: [phone]   Fax:  [phone]

## 2024-03-01 ENCOUNTER — Ambulatory Visit
Admission: RE | Admit: 2024-03-01 | Discharge: 2024-03-01 | Disposition: A | Discharge status: Home / Self Care | Source: Ambulatory Visit | Attending: Student | Admitting: Student

## 2024-03-01 DIAGNOSIS — M47816 Spondylosis without myelopathy or radiculopathy, lumbar region: Secondary | ICD-10-CM

## 2024-03-01 DIAGNOSIS — M47814 Spondylosis without myelopathy or radiculopathy, thoracic region: Secondary | ICD-10-CM

## 2024-03-01 DIAGNOSIS — M5126 Other intervertebral disc displacement, lumbar region: Secondary | ICD-10-CM | POA: Diagnosis not present

## 2024-03-04 ENCOUNTER — Other Ambulatory Visit: Payer: Self-pay

## 2024-03-04 NOTE — Progress Notes (Signed)
 Specialty Pharmacy Refill Coordination Note  Adam Glass is a 68 y.o. male assessed today regarding refills of clinic administered specialty medication(s) Omalizumab  (XOLAIR )   Clinic requested Courier to Provider Office   Delivery date: 03/11/24   Verified address: [address] [place] [address]   Medication will be filled on 10.03.25.   Copay: $0.00 Appointment: 10.08.25

## 2024-03-06 ENCOUNTER — Ambulatory Visit (HOSPITAL_BASED_OUTPATIENT_CLINIC_OR_DEPARTMENT_OTHER): Payer: Self-pay | Admitting: Physical Therapy

## 2024-03-07 ENCOUNTER — Other Ambulatory Visit: Payer: Self-pay

## 2024-03-13 ENCOUNTER — Ambulatory Visit (INDEPENDENT_AMBULATORY_CARE_PROVIDER_SITE_OTHER)

## 2024-03-13 DIAGNOSIS — L501 Idiopathic urticaria: Secondary | ICD-10-CM | POA: Diagnosis not present

## 2024-03-21 DIAGNOSIS — C61 Malignant neoplasm of prostate: Secondary | ICD-10-CM | POA: Diagnosis not present

## 2024-03-29 ENCOUNTER — Other Ambulatory Visit: Payer: Self-pay

## 2024-03-29 NOTE — Progress Notes (Signed)
 Specialty Pharmacy Refill Coordination Note  Adam Glass is a 68 y.o. male assessed today regarding refills of clinic administered specialty medication(s) Omalizumab  (XOLAIR )   Clinic requested Courier to Provider Office   Delivery date: 04/10/24   Verified address: [address] [place] [address]   Medication will be filled on: 04/09/24

## 2024-04-09 ENCOUNTER — Other Ambulatory Visit: Payer: Self-pay

## 2024-04-17 ENCOUNTER — Ambulatory Visit

## 2024-04-17 DIAGNOSIS — L501 Idiopathic urticaria: Secondary | ICD-10-CM

## 2024-04-24 ENCOUNTER — Other Ambulatory Visit: Payer: Self-pay

## 2024-04-24 NOTE — Progress Notes (Signed)
 Specialty Pharmacy Ongoing Clinical Assessment Note  Adam Glass is a 68 y.o. male who is being followed by the specialty pharmacy service for RxSp Allergy   Patient's specialty medication(s) reviewed today: Omalizumab  (XOLAIR )   Missed doses in the last 4 weeks: 0   Patient/Caregiver did not have any additional questions or concerns.   Therapeutic benefit summary: Patient is achieving benefit   Adverse events/side effects summary: No adverse events/side effects   Patient's therapy is appropriate to: Continue    Goals Addressed             This Visit's Progress    Reduce signs and symptoms   On track    Patient is on track. Patient will maintain adherence. Patient states that he is well-controlled and provider is slowly tapering down his dose.         Follow up: 12 months  [facility]

## 2024-04-24 NOTE — Progress Notes (Signed)
 Specialty Pharmacy Refill Coordination Note  Adam Glass is a 68 y.o. male contacted today regarding refills of specialty medication(s) Omalizumab  (XOLAIR )   Patient requested Courier to Provider Office   Delivery date: 05/16/24   Verified address: [address] [place] [address]   Medication will be filled on: 05/15/24

## 2024-05-15 ENCOUNTER — Other Ambulatory Visit: Payer: Self-pay

## 2024-05-22 ENCOUNTER — Ambulatory Visit

## 2024-05-22 DIAGNOSIS — L501 Idiopathic urticaria: Secondary | ICD-10-CM | POA: Diagnosis not present

## 2024-06-03 ENCOUNTER — Other Ambulatory Visit: Payer: Self-pay

## 2024-06-04 ENCOUNTER — Other Ambulatory Visit (HOSPITAL_BASED_OUTPATIENT_CLINIC_OR_DEPARTMENT_OTHER): Payer: Self-pay

## 2024-06-04 MED ORDER — ADACEL 5-2-15.5 LF-MCG/0.5 IM SUSY
0.5000 mL | PREFILLED_SYRINGE | Freq: Once | INTRAMUSCULAR | 0 refills | Status: AC
  Filled 2024-06-04: qty 0.5, 1d supply, fill #0

## 2024-06-24 ENCOUNTER — Other Ambulatory Visit (HOSPITAL_COMMUNITY): Payer: Self-pay

## 2024-06-24 ENCOUNTER — Other Ambulatory Visit: Payer: Self-pay

## 2024-06-24 ENCOUNTER — Telehealth: Payer: Self-pay

## 2024-06-24 NOTE — Telephone Encounter (Signed)
 Xolair  showing copay of $1,096.66

## 2024-06-26 ENCOUNTER — Other Ambulatory Visit: Payer: Self-pay

## 2024-06-26 NOTE — Progress Notes (Signed)
 Specialty Pharmacy Refill Coordination Note  Adam Glass is a 69 y.o. male contacted today regarding refills of specialty medication(s) Omalizumab  (XOLAIR )   Patient requested Courier to Provider Office   Delivery date: 06/28/24   Verified address: [address] [place] [address]   Medication will be filled on: 06/27/24

## 2024-06-27 ENCOUNTER — Other Ambulatory Visit: Payer: Self-pay

## 2024-06-27 NOTE — Telephone Encounter (Signed)
 Patient contacted and CC obtained.

## 2024-07-03 ENCOUNTER — Ambulatory Visit

## 2024-07-03 DIAGNOSIS — L501 Idiopathic urticaria: Secondary | ICD-10-CM

## 2024-07-08 ENCOUNTER — Ambulatory Visit: Admitting: Allergy

## 2024-08-14 ENCOUNTER — Ambulatory Visit
